# Patient Record
Sex: Female | Born: 1935
Health system: Southern US, Community
[De-identification: ages and names within clinical notes are randomized; demographics above are authoritative.]

## PROBLEM LIST (undated history)

## (undated) DIAGNOSIS — R0602 Shortness of breath: Secondary | ICD-10-CM

## (undated) DIAGNOSIS — G4733 Obstructive sleep apnea (adult) (pediatric): Secondary | ICD-10-CM

## (undated) DIAGNOSIS — Z973 Presence of spectacles and contact lenses: Secondary | ICD-10-CM

## (undated) DIAGNOSIS — M1990 Unspecified osteoarthritis, unspecified site: Secondary | ICD-10-CM

## (undated) DIAGNOSIS — E559 Vitamin D deficiency, unspecified: Secondary | ICD-10-CM

## (undated) DIAGNOSIS — D649 Anemia, unspecified: Secondary | ICD-10-CM

## (undated) DIAGNOSIS — Z803 Family history of malignant neoplasm of breast: Secondary | ICD-10-CM

## (undated) DIAGNOSIS — I6529 Occlusion and stenosis of unspecified carotid artery: Secondary | ICD-10-CM

## (undated) DIAGNOSIS — N189 Chronic kidney disease, unspecified: Secondary | ICD-10-CM

## (undated) DIAGNOSIS — I10 Essential (primary) hypertension: Secondary | ICD-10-CM

## (undated) DIAGNOSIS — E785 Hyperlipidemia, unspecified: Secondary | ICD-10-CM

## (undated) DIAGNOSIS — K219 Gastro-esophageal reflux disease without esophagitis: Secondary | ICD-10-CM

## (undated) DIAGNOSIS — H269 Unspecified cataract: Secondary | ICD-10-CM

## (undated) DIAGNOSIS — R011 Cardiac murmur, unspecified: Secondary | ICD-10-CM

## (undated) DIAGNOSIS — R350 Frequency of micturition: Secondary | ICD-10-CM

## (undated) DIAGNOSIS — I272 Pulmonary hypertension, unspecified: Secondary | ICD-10-CM

## (undated) DIAGNOSIS — R0989 Other specified symptoms and signs involving the circulatory and respiratory systems: Secondary | ICD-10-CM

## (undated) DIAGNOSIS — Z972 Presence of dental prosthetic device (complete) (partial): Secondary | ICD-10-CM

## (undated) DIAGNOSIS — Z808 Family history of malignant neoplasm of other organs or systems: Secondary | ICD-10-CM

## (undated) DIAGNOSIS — Z6841 Body Mass Index (BMI) 40.0 and over, adult: Secondary | ICD-10-CM

## (undated) HISTORY — PX: APPENDECTOMY: SHX54

## (undated) HISTORY — DX: Family history of malignant neoplasm of breast: Z80.3

## (undated) HISTORY — DX: Body Mass Index (BMI) 40.0 and over, adult: Z684

## (undated) HISTORY — DX: Morbid (severe) obesity due to excess calories: E66.01

## (undated) HISTORY — DX: Pulmonary hypertension, unspecified: I27.20

## (undated) HISTORY — DX: Essential (primary) hypertension: I10

## (undated) HISTORY — PX: BREAST LUMPECTOMY: SHX2

## (undated) HISTORY — DX: Obstructive sleep apnea (adult) (pediatric): G47.33

## (undated) HISTORY — DX: Other specified symptoms and signs involving the circulatory and respiratory systems: R09.89

## (undated) HISTORY — PX: TOTAL KNEE ARTHROPLASTY: SHX125

## (undated) HISTORY — DX: Family history of malignant neoplasm of other organs or systems: Z80.8

## (undated) HISTORY — DX: Occlusion and stenosis of unspecified carotid artery: I65.29

## (undated) HISTORY — DX: Hyperlipidemia, unspecified: E78.5

## (undated) HISTORY — DX: Vitamin D deficiency, unspecified: E55.9

## (undated) HISTORY — PX: ABDOMINAL HYSTERECTOMY: SHX81

## (undated) HISTORY — PX: US ECHOCARDIOGRAPHY: HXRAD669

## (undated) HISTORY — PX: JOINT REPLACEMENT: SHX530

## (undated) HISTORY — PX: OTHER SURGICAL HISTORY: SHX169

---

## 2000-04-09 ENCOUNTER — Encounter: Payer: Self-pay | Admitting: Psychology

## 2000-04-09 ENCOUNTER — Emergency Department (HOSPITAL_COMMUNITY): Admission: EM | Admit: 2000-04-09 | Discharge: 2000-04-09 | Payer: Self-pay | Admitting: *Deleted

## 2001-01-01 ENCOUNTER — Encounter: Admission: RE | Admit: 2001-01-01 | Discharge: 2001-01-01 | Payer: Self-pay | Admitting: Family Medicine

## 2003-04-19 ENCOUNTER — Ambulatory Visit (HOSPITAL_COMMUNITY): Admission: RE | Admit: 2003-04-19 | Discharge: 2003-04-19 | Payer: Self-pay | Admitting: *Deleted

## 2005-09-04 ENCOUNTER — Inpatient Hospital Stay (HOSPITAL_COMMUNITY): Admission: EM | Admit: 2005-09-04 | Discharge: 2005-09-09 | Payer: Self-pay | Admitting: Emergency Medicine

## 2007-04-13 ENCOUNTER — Emergency Department (HOSPITAL_COMMUNITY): Admission: EM | Admit: 2007-04-13 | Discharge: 2007-04-14 | Payer: Self-pay | Admitting: Emergency Medicine

## 2009-04-03 ENCOUNTER — Emergency Department (HOSPITAL_COMMUNITY): Admission: EM | Admit: 2009-04-03 | Discharge: 2009-04-03 | Payer: Self-pay | Admitting: Emergency Medicine

## 2009-06-04 HISTORY — PX: CARDIOVASCULAR STRESS TEST: SHX262

## 2010-05-01 ENCOUNTER — Inpatient Hospital Stay (HOSPITAL_COMMUNITY): Admission: RE | Admit: 2010-05-01 | Discharge: 2010-05-04 | Payer: Self-pay | Admitting: Orthopedic Surgery

## 2010-06-04 HISTORY — PX: OTHER SURGICAL HISTORY: SHX169

## 2010-08-15 LAB — CBC
HCT: 26.6 % — ABNORMAL LOW (ref 36.0–46.0)
HCT: 31.4 % — ABNORMAL LOW (ref 36.0–46.0)
HCT: 33.8 % — ABNORMAL LOW (ref 36.0–46.0)
Hemoglobin: 8.6 g/dL — ABNORMAL LOW (ref 12.0–15.0)
Hemoglobin: 10.3 g/dL — ABNORMAL LOW (ref 12.0–15.0)
MCH: 28.1 pg (ref 26.0–34.0)
MCH: 29.1 pg (ref 26.0–34.0)
MCHC: 32.3 g/dL (ref 30.0–36.0)
MCHC: 32.5 g/dL (ref 30.0–36.0)
MCHC: 32.8 g/dL (ref 30.0–36.0)
MCV: 86.4 fL (ref 78.0–100.0)
MCV: 86.4 fL (ref 78.0–100.0)
MCV: 87.2 fL (ref 78.0–100.0)
MCV: 88.7 fL (ref 78.0–100.0)
Platelets: 187 10*3/uL (ref 150–400)
Platelets: 232 10*3/uL (ref 150–400)
Platelets: 286 10*3/uL (ref 150–400)
RDW: 15.8 % — ABNORMAL HIGH (ref 11.5–15.5)
RDW: 15.9 % — ABNORMAL HIGH (ref 11.5–15.5)
RDW: 16.4 % — ABNORMAL HIGH (ref 11.5–15.5)
WBC: 11.4 10*3/uL — ABNORMAL HIGH (ref 4.0–10.5)
WBC: 14 10*3/uL — ABNORMAL HIGH (ref 4.0–10.5)

## 2010-08-15 LAB — COMPREHENSIVE METABOLIC PANEL
BUN: 24 mg/dL — ABNORMAL HIGH (ref 6–23)
CO2: 28 mEq/L (ref 19–32)
Calcium: 9.2 mg/dL (ref 8.4–10.5)
Chloride: 109 mEq/L (ref 96–112)
Creatinine, Ser: 1.38 mg/dL — ABNORMAL HIGH (ref 0.4–1.2)
GFR calc Af Amer: 45 mL/min — ABNORMAL LOW (ref >60)
Total Bilirubin: 0.4 mg/dL (ref 0.3–1.2)
Total Protein: 7.1 g/dL (ref 6.0–8.3)

## 2010-08-15 LAB — GLUCOSE, CAPILLARY
Glucose-Capillary: 138 mg/dL — ABNORMAL HIGH (ref 70–99)
Glucose-Capillary: 111 mg/dL — ABNORMAL HIGH (ref 70–99)
Glucose-Capillary: 114 mg/dL — ABNORMAL HIGH (ref 70–99)
Glucose-Capillary: 122 mg/dL — ABNORMAL HIGH (ref 70–99)
Glucose-Capillary: 132 mg/dL — ABNORMAL HIGH (ref 70–99)
Glucose-Capillary: 133 mg/dL — ABNORMAL HIGH (ref 70–99)
Glucose-Capillary: 135 mg/dL — ABNORMAL HIGH (ref 70–99)
Glucose-Capillary: 155 mg/dL — ABNORMAL HIGH (ref 70–99)
Glucose-Capillary: 172 mg/dL — ABNORMAL HIGH (ref 70–99)

## 2010-08-15 LAB — BASIC METABOLIC PANEL
Calcium: 8.7 mg/dL (ref 8.4–10.5)
Chloride: 108 mEq/L (ref 96–112)
GFR calc non Af Amer: 45 mL/min — ABNORMAL LOW (ref >60)
Glucose, Bld: 136 mg/dL — ABNORMAL HIGH (ref 70–99)
Potassium: 4 mEq/L (ref 3.5–5.1)
Sodium: 141 mEq/L (ref 135–145)

## 2010-08-15 LAB — BASIC METABOLIC PANEL WITH GFR
BUN: 24 mg/dL — ABNORMAL HIGH (ref 6–23)
CO2: 24 meq/L (ref 19–32)
Calcium: 8.6 mg/dL (ref 8.4–10.5)
Chloride: 108 meq/L (ref 96–112)
Creatinine, Ser: 3.15 mg/dL — ABNORMAL HIGH (ref 0.4–1.2)
GFR calc non Af Amer: 15 mL/min — ABNORMAL LOW
Glucose, Bld: 129 mg/dL — ABNORMAL HIGH (ref 70–99)
Potassium: 4.1 meq/L (ref 3.5–5.1)
Sodium: 142 meq/L (ref 135–145)

## 2010-08-15 LAB — URINALYSIS, ROUTINE W REFLEX MICROSCOPIC
Bilirubin Urine: NEGATIVE
Hgb urine dipstick: NEGATIVE
Ketones, ur: NEGATIVE mg/dL
Nitrite: NEGATIVE
Specific Gravity, Urine: 1.014 (ref 1.005–1.030)
Urobilinogen, UA: 0.2 mg/dL (ref 0.0–1.0)

## 2010-08-15 LAB — DIFFERENTIAL
Basophils Absolute: 0 10*3/uL (ref 0.0–0.1)
Basophils Relative: 0 % (ref 0–1)
Eosinophils Absolute: 0.1 10*3/uL (ref 0.0–0.7)
Lymphs Abs: 1.7 10*3/uL (ref 0.7–4.0)
Monocytes Absolute: 0.5 10*3/uL (ref 0.1–1.0)

## 2010-08-15 LAB — CROSSMATCH
Antibody Screen: NEGATIVE
Unit division: 0

## 2010-08-15 LAB — URINE CULTURE
Culture  Setup Time: 201111221720
Culture: NO GROWTH

## 2010-08-15 LAB — HEMOGLOBIN A1C
Hgb A1c MFr Bld: 6.4 % — ABNORMAL HIGH
Mean Plasma Glucose: 137 mg/dL — ABNORMAL HIGH

## 2010-08-15 LAB — PROTIME-INR
INR: 1.03 (ref 0.00–1.49)
Prothrombin Time: 13.7 seconds (ref 11.6–15.2)

## 2010-08-15 LAB — SURGICAL PCR SCREEN: Staphylococcus aureus: NEGATIVE

## 2010-09-07 LAB — URINALYSIS, ROUTINE W REFLEX MICROSCOPIC
Glucose, UA: NEGATIVE mg/dL
Nitrite: NEGATIVE
Specific Gravity, Urine: 1.011 (ref 1.005–1.030)
pH: 6.5 (ref 5.0–8.0)

## 2010-09-07 LAB — CBC
MCHC: 33.1 g/dL (ref 30.0–36.0)
MCV: 88 fL (ref 78.0–100.0)
Platelets: 237 10*3/uL (ref 150–400)
RBC: 3.61 MIL/uL — ABNORMAL LOW (ref 3.87–5.11)
RDW: 16.4 % — ABNORMAL HIGH (ref 11.5–15.5)

## 2010-09-07 LAB — COMPREHENSIVE METABOLIC PANEL
AST: 16 U/L (ref 0–37)
CO2: 23 mEq/L (ref 19–32)
Calcium: 8.4 mg/dL (ref 8.4–10.5)
Creatinine, Ser: 0.84 mg/dL (ref 0.4–1.2)
GFR calc Af Amer: >60 mL/min (ref >60)
GFR calc non Af Amer: >60 mL/min (ref >60)
Total Protein: 6.6 g/dL (ref 6.0–8.3)

## 2010-09-07 LAB — DIFFERENTIAL
Eosinophils Relative: 1 % (ref 0–5)
Lymphocytes Relative: 14 % (ref 12–46)
Lymphs Abs: 0.9 10*3/uL (ref 0.7–4.0)

## 2010-09-07 LAB — POCT CARDIAC MARKERS
Myoglobin, poc: 202 ng/mL (ref 12–200)
Troponin i, poc: <0.05 ng/mL (ref 0.00–0.09)

## 2010-09-07 LAB — URINE CULTURE: Colony Count: NO GROWTH

## 2010-10-20 NOTE — H&P (Signed)
[name], CRUSE               ACCOUNT NO.:  [id number]   MEDICAL RECORD NO.:  [id number]          PATIENT TYPE:  EMS   LOCATION:  ED                           FACILITY:  [facility]   PHYSICIAN:  Sheila Oats, M.D.DATE OF BIRTH:  12/27/1935   DATE OF ADMISSION:  09/04/2005  DATE OF DISCHARGE:                                HISTORY & PHYSICAL   CHIEF COMPLAINT:  Abdominal pain.   HISTORY OF PRESENT ILLNESS:  The patient is a 75 year old black female with  a past medical history significant for C-section and hysterectomy more than  30 years ago, hypertension, diabetes mellitus, and hyperlipidemia, who  presents with complaints of severe abdominal pain x 1 day.  The pain is  described as cramping and mid-abdominal/periumbilical in location, 10 out of  10 in intensity associated with nausea and vomiting.  She denies  hematemesis, diarrhea, fevers, hematochezia, dysuria, melena.  The patient  also denies chest pain, shortness of breath, and no PND.  The patient was  seen in the office today by Dr. Rex Kras and a plain film of her abdomen  revealed air fluid levels consistent with small bowel obstruction.  She was  sent to the ER for further evaluation and admission.   In the ER, the patient had a CT scan of her abdomen done and it showed  findings consistent with partial small bowel obstruction and a transition  zone in the mid ileum but none precisely localized.  No pelvic mass.  Urinalysis was also done and was consistent with infection and the patient's  white cell count mildly elevated at 11.4.  She is admitted to the [facility] for evaluation and management.  The patient states that  her last bowel movement was yesterday and she has had flatus today.   PAST MEDICAL HISTORY:  As stated above.   MEDICATIONS:  1.  Cartia XL 300 mg daily.  2.  Diovan HCT 160/12.5 mg daily.  3.  Actos 30 mg daily.  4.  Lipitor 20 mg daily.   ALLERGIES:  CODEINE.   SOCIAL  HISTORY:  She denies tobacco and alcohol.   FAMILY HISTORY:  Her father has diabetes.  Her mother is deceased of lung  cancer.  She also had diabetes.  She has a sister with diabetes.   REVIEW OF SYMPTOMS:  As per HPI, other comprehensive review of systems  negative.   PHYSICAL EXAMINATION:  GENERAL:  The patient is an obese elderly black female, she has an NG tube  in place, she is in no apparent distress.  VITAL SIGNS:  Temperature 97, blood pressure 154/76, pulse 76, respiratory  rate 20, O2 saturation 98%.  HEENT:  PERRL, EOMI, sclerae anicteric, slightly dry mucous membranes.  NECK:  Supple, no adenopathy, no thyromegaly.  LUNGS:  Clear to auscultation bilaterally, no crackles or wheezes.  CARDIOVASCULAR:  Normal S1 and S2, regular rate and rhythm, no S3  appreciated.  ABDOMEN:  Mildly distended, hypoactive bowel sounds, mild supraumbilical  tenderness.  No organomegaly and no masses palpable.  EXTREMITIES:  No cyanosis or edema.  NEUROLOGICAL:  Alert and oriented x 3, cranial nerves 2-12 grossly intact,  nonfocal exam.   LABORATORY DATA:  CT scan of the abdomen shows findings consistent with a  partial small bowel obstruction, right ovarian cyst is noted, 3.4 by 2.8 cm,  descending and sigmoid colon diverticulosis.  White blood cell count 11.4,  hemoglobin 12.4, hematocrit 37, platelet count 309, neutrophil count 87%.  Sodium 141, potassium 3.4, chloride 102, CO2 28, glucose 193, BUN 11,  creatinine 0.9.  Her LFTs are within normal limits.  Lipase normal at 16.  Urinalysis shows specific gravity of 1.014 with a pH 7.4.  Urine nitrite is  negative.  Leukocyte esterase is large with 11 to 20 WBCs.   ASSESSMENT/PLAN:  1.  Partial small bowel obstruction - will manage conservatively - NG tube,      IV fluids, keep n.p.o. for now and pain management with IV analgesics.      Surgery consulted per ER physician and admission to the medicine service      recommended.  2.  Probable  urinary tract infection - will obtain urine cultures, empiric      antibiotics, and follow.  3.  Hypokalemia - replete potassium.  4.  Diabetes mellitus - monitor Accu-Cheks, sliding scale coverage, hold      oral hypoglycemics while patient is n.p.o.  5.  Hypertension - IV anti-hypertensives for now and resume Cartia and      Diovan when able to tolerate p.o.  6.  Hyperlipidemia - follow and resume Lipitor when taking p.o.      Sheila Oats, M.D.  Electronically Signed     ACV/MEDQ  D:  09/04/2005  T:  09/04/2005  Job:  [id number]   cc:   Lennette Bihari L. Little, M.D.  Fax: [place]. Bubba Hales, M.D.  Fax: [phone]

## 2010-10-20 NOTE — Discharge Summary (Signed)
[name], [name]               ACCOUNT NO.:  [id number]   MEDICAL RECORD NO.:  [id number]          PATIENT TYPE:  INP   LOCATION:  1505                         FACILITY:  [facility]   PHYSICIAN:  Ashby Dawes. Polite, M.D. DATE OF BIRTH:  26-Mar-1936   DATE OF ADMISSION:  09/04/2005  DATE OF DISCHARGE:  09/09/2005                                 DISCHARGE SUMMARY   DISCHARGE DIAGNOSES:  1.  Resolved partial small bowel obstruction.  2.  Neck spasm, improved secondary to lying improperly in bed on a pillow.      Continue conservative treatment with as needed Flexeril and Tylenol.  3.  Hypertension.  4.  Diabetes.  5.  Obesity.   DISCHARGE MEDICATIONS:  The patient is to resume home meds which includes  Cardia XL 300 mg daily, Diovan/HCT 160/12.5 mg daily, Actos 30 mg daily,  Lipitor 20 mg daily.   DISPOSITION:  The patient is discharged to home in stable condition.  Asked  her to slowly resume to a normal diet and increase activity.  Follow up with  primary doctor in approximately 1 week.   STUDIES:  The patient had a CT of the abdomen consistent with partial small  bowel obstruction.  The patient had several abdominal series, the final one  on 04/07 showing normal bowel gas pattern with no obstruction.  CBC at time  of dictation:  white count 8.2, hemoglobin 10.4, platelet 274.  D-Met within  normal limits.  Urinalysis:  Nitrite negative, leukocytes large, WBC 11  to20, rare bacteria.  Urine culture - no growth.   HISTORY OF PRESENT ILLNESS:  A 75 year old female with the above medical  problems presented to the ED with complaint of abdominal pain.  In the ED,  the patient was evaluated and it was thought the patient had partial small  bowel obstruction. Admission was deemed necessary for further evaluation and  treatment.  Please see dictated history and physical for further details.   PAST MEDICAL HISTORY:  As stated above.   MEDICATIONS ON ADMISSION:  Per admission history and  physical.   ALLERGIES:  Reports an allergy to CODEINE.   SOCIAL HISTORY:  Per the admission history and physical.   FAMILY HISTORY:  Per the admission history and physical.   HOSPITAL COURSE:  The patient was admitted to a medical floor bed for  evaluation and treatment of partial small bowel obstruction.  The patient  was treated conservatively with intravenous fluids, antiemetics, and NG tube  was transiently placed for 48 hours.  Surgical consultation was requested  and recommended continued conservative treatment.  The patient's hospital  course was relatively without problems.  The patient continued to have  follow-up abdominal series until there was shown complete resolution.  In  the interim, diet was slowly advanced.  NG tube removed, and the patient was  having normal bowel movements in the hospital.  At this time, the patient is  stable for discharge.   DISCHARGE DIAGNOSES:  1.  Partial small bowel obstruction.  2.  Neck spasm/cramp.  The patient is of short stature and was lying  in the      middle of the bed with the head of the bed elevated as well as two      pillows.  I felt that this is most likely the cause of her cramps/spasms      in her neck.  The patient is going to consider conservative treatment of      Flexeril for this.  3.  The patient has several chronic medical problems which includes      diabetes, hypertension and obesity.  Will continue current outpatient      meds.  Recommend the patient follow up with primary doctor in      approximately 1 to 2 weeks.      Ashby Dawes. Polite, M.D.  Electronically Signed     RDP/MEDQ  D:  09/09/2005  T:  09/10/2005  Job:  [id number]

## 2011-01-31 ENCOUNTER — Other Ambulatory Visit: Payer: Self-pay | Admitting: Family Medicine

## 2011-01-31 DIAGNOSIS — Z1231 Encounter for screening mammogram for malignant neoplasm of breast: Secondary | ICD-10-CM

## 2011-02-06 ENCOUNTER — Ambulatory Visit
Admission: RE | Admit: 2011-02-06 | Discharge: 2011-02-06 | Disposition: A | Discharge status: Home / Self Care | Payer: 59 | Source: Ambulatory Visit | Attending: Family Medicine | Admitting: Family Medicine

## 2011-02-06 DIAGNOSIS — Z1231 Encounter for screening mammogram for malignant neoplasm of breast: Secondary | ICD-10-CM

## 2011-03-13 LAB — CBC
HCT: 30 — ABNORMAL LOW
Hemoglobin: 10.2 — ABNORMAL LOW
MCHC: 34
RDW: 14.9

## 2011-03-13 LAB — DIFFERENTIAL
Basophils Absolute: 0.1
Basophils Relative: 2 — ABNORMAL HIGH
Eosinophils Relative: 2
Monocytes Absolute: 0.6

## 2011-03-13 LAB — URINE CULTURE
Colony Count: NO GROWTH
Culture: NO GROWTH

## 2011-03-13 LAB — URINALYSIS, ROUTINE W REFLEX MICROSCOPIC
Ketones, ur: NEGATIVE
Nitrite: NEGATIVE
Protein, ur: NEGATIVE
pH: 5.5

## 2011-03-13 LAB — BASIC METABOLIC PANEL
CO2: 26
Chloride: 106
Glucose, Bld: 134 — ABNORMAL HIGH
Potassium: 3.6
Sodium: 137

## 2011-03-13 LAB — POCT CARDIAC MARKERS: Troponin i, poc: <0.05

## 2011-06-18 ENCOUNTER — Encounter (HOSPITAL_COMMUNITY): Payer: Self-pay | Admitting: Pharmacy Technician

## 2011-06-19 ENCOUNTER — Other Ambulatory Visit: Payer: Self-pay | Admitting: Orthopedic Surgery

## 2011-06-21 ENCOUNTER — Other Ambulatory Visit: Payer: Self-pay | Admitting: Orthopedic Surgery

## 2011-06-21 ENCOUNTER — Encounter (HOSPITAL_COMMUNITY): Payer: Self-pay

## 2011-06-21 ENCOUNTER — Encounter (HOSPITAL_COMMUNITY)
Admission: RE | Admit: 2011-06-21 | Discharge: 2011-06-21 | Disposition: A | Discharge status: Home / Self Care | Payer: 59 | Source: Ambulatory Visit | Attending: Orthopedic Surgery | Admitting: Orthopedic Surgery

## 2011-06-21 HISTORY — DX: Anemia, unspecified: D64.9

## 2011-06-21 HISTORY — DX: Gastro-esophageal reflux disease without esophagitis: K21.9

## 2011-06-21 HISTORY — DX: Unspecified osteoarthritis, unspecified site: M19.90

## 2011-06-21 HISTORY — DX: Cardiac murmur, unspecified: R01.1

## 2011-06-21 HISTORY — DX: Shortness of breath: R06.02

## 2011-06-21 HISTORY — DX: Presence of dental prosthetic device (complete) (partial): Z97.2

## 2011-06-21 HISTORY — DX: Frequency of micturition: R35.0

## 2011-06-21 HISTORY — DX: Presence of spectacles and contact lenses: Z97.3

## 2011-06-21 HISTORY — DX: Unspecified cataract: H26.9

## 2011-06-21 HISTORY — DX: Chronic kidney disease, unspecified: N18.9

## 2011-06-21 LAB — URINALYSIS, ROUTINE W REFLEX MICROSCOPIC
Bilirubin Urine: NEGATIVE
Hgb urine dipstick: NEGATIVE
Ketones, ur: NEGATIVE mg/dL
Nitrite: NEGATIVE
Urobilinogen, UA: 0.2 mg/dL (ref 0.0–1.0)

## 2011-06-21 LAB — COMPREHENSIVE METABOLIC PANEL
AST: 15 U/L (ref 0–37)
Alkaline Phosphatase: 86 U/L (ref 39–117)
CO2: 27 mEq/L (ref 19–32)
Chloride: 105 mEq/L (ref 96–112)
Creatinine, Ser: 1.43 mg/dL — ABNORMAL HIGH (ref 0.50–1.10)
GFR calc non Af Amer: 35 mL/min — ABNORMAL LOW (ref >90)
Potassium: 4.4 mEq/L (ref 3.5–5.1)
Total Bilirubin: 0.3 mg/dL (ref 0.3–1.2)

## 2011-06-21 LAB — URINE MICROSCOPIC-ADD ON

## 2011-06-21 LAB — URINE CULTURE

## 2011-06-21 LAB — DIFFERENTIAL
Basophils Absolute: 0 10*3/uL (ref 0.0–0.1)
Basophils Relative: 0 % (ref 0–1)
Eosinophils Absolute: 0.1 10*3/uL (ref 0.0–0.7)
Eosinophils Relative: 2 % (ref 0–5)
Monocytes Absolute: 0.4 10*3/uL (ref 0.1–1.0)
Neutro Abs: 2.8 10*3/uL (ref 1.7–7.7)

## 2011-06-21 LAB — CBC
HCT: 29.8 % — ABNORMAL LOW (ref 36.0–46.0)
MCV: 85.9 fL (ref 78.0–100.0)
Platelets: 266 10*3/uL (ref 150–400)
RBC: 3.47 MIL/uL — ABNORMAL LOW (ref 3.87–5.11)
WBC: 4.8 10*3/uL (ref 4.0–10.5)

## 2011-06-21 LAB — SURGICAL PCR SCREEN: Staphylococcus aureus: NEGATIVE

## 2011-06-21 NOTE — H&P (Signed)
  [name] MRN:  [id number] DOB/SEX:  Mar 05, 1936/female  CHIEF COMPLAINT:  Painful right Knee  HISTORY: Patient is a 76 y.o. female presented with a history of pain in the right knee. Onset of symptoms was gradual starting several years ago with gradually worsening course since that time. The patient noted no past surgery on the right knee. Prior procedures on the knee include none. Patient has been treated conservatively with over-the-counter NSAIDs and activity modification. Patient currently rates pain in the knee at 8 out of 10 with activity. There is pain at night.  PAST MEDICAL HISTORY: There are no active problems to display for this patient.  No past medical history on file. No past surgical history on file.   MEDICATIONS:   (Not in a hospital admission)  ALLERGIES:   Allergies  Allergen Reactions  . Codeine Itching  . Ibuprofen Other (See Comments)    "gave her kidney trouble"    REVIEW OF SYSTEMS:  Pertinent items are noted in HPI.   FAMILY HISTORY:  No family history on file.  SOCIAL HISTORY:   History  Substance Use Topics  . Smoking status: Not on file  . Smokeless tobacco: Not on file  . Alcohol Use: Not on file     EXAMINATION:  Vital signs in last 24 hours: @VSRANGES @  General appearance: alert, cooperative and no distress Lungs: clear to auscultation bilaterally Heart: regular rate and rhythm, S1, S2 normal, no murmur, click, rub or gallop Abdomen: soft, non-tender; bowel sounds normal; no masses,  no organomegaly Extremities: extremities normal, atraumatic, no cyanosis or edema and Homans sign is negative, no sign of DVT Pulses: 2+ and symmetric Skin: Skin color, texture, turgor normal. No rashes or lesions Neurologic: Alert and oriented X 3, normal strength and tone. Normal symmetric reflexes. Normal coordination and gait  Musculoskeletal:  ROM 0-105, Ligaments intact,  Imaging Review Plain radiographs demonstrate severe degenerative  joint disease of the right knee. The overall alignment is mild varus. The bone quality appears to be good for age and reported activity level.  Assessment/Plan: End stage arthritis, right knee   The patient history, physical examination and imaging studies are consistent with advanced degenerative joint disease of the right knee. The patient has failed conservative treatment.  The clearance notes were reviewed.  After discussion with the patient it was felt that Total Knee Replacement was indicated. The procedure,  risks, and benefits of total knee arthroplasty were presented and reviewed. The risks including but not limited to aseptic loosening, infection, blood clots, vascular injury, stiffness, patella tracking problems complications among others were discussed. The patient acknowledged the explanation, agreed to proceed with the plan.  [name] 06/21/2011, 11:01 AM

## 2011-06-21 NOTE — Progress Notes (Signed)
Requested CXR, EKG, OV notes from Dr. Irish Lack at [facility].  Requested sleep study from [facility] [place]. Chart left for review by anesthesia re: lab abnormalities.

## 2011-06-21 NOTE — Pre-Procedure Instructions (Signed)
[place]  06/21/2011   Your procedure is scheduled on:  [facility] January 28  Report to Section at 6:30 AM.  Call this number if you have problems the morning of surgery: [phone]   Remember:   Do not eat food:After Midnight.  May have clear liquids: up to 4 Hours before arrival.  Clear liquids include soda, tea, black coffee, apple or grape juice, broth.  Take these medicines the morning of surgery with A SIP OF WATER: Diltiazem   Do not wear jewelry, make-up or nail polish.  Do not wear lotions, powders, or perfumes. You may wear deodorant.  Do not shave 48 hours prior to surgery.  Do not bring valuables to the hospital.  Contacts, dentures or bridgework may not be worn into surgery.  Leave suitcase in the car. After surgery it may be brought to your room.  For patients admitted to the hospital, checkout time is 11:00 AM the day of discharge.   Patients discharged the day of surgery will not be allowed to drive home.  Name and phone number of your driver: NA  Special Instructions: Incentive Spirometry - Practice and bring it with you on the day of surgery. and CHG Shower Use Special Wash: 1/2 bottle night before surgery and 1/2 bottle morning of surgery.   Please read over the following fact sheets that you were given: Pain Booklet, Coughing and Deep Breathing, Blood Transfusion Information and Surgical Site Infection Prevention

## 2011-06-22 NOTE — Consult Note (Signed)
Anesthesia:  Patient is a 76 year old female scheduled for right TKA on 07/02/11.  Her history includes murmur, OSA, SOB, anemia, CKD Stage III, GERD, cataracts, DM2, s/p left TKA 04/2010.  Her Cardiologist is Dr. Irish Lack who saw her for clearance on 05/04/11.  No further cardiac testing was recommended at that time.  EKG then showed SR.  She had a low risk stress test in April of 2011. EF was 73%.  Echo from 06/23/08 showed normal LV size and function, EF 60-65%, normal RV size and function, trace MR, mild-mod TR, no AS or AR.  Labs noted.  BUN/Cr 30/1.43. Glucose was 92.  H/H 9.8/29.8. (Based on previous labs, her baseline Hgb seems to be around 10).  A T & S is already done.  PTT is mildly elevated at 43, but PT/INR are WNL.  Of note, her Cr peaked at 3.6 following her left TKA in 2011.  Urine culture showed E. Coli (20,000).  I did call her urine and H/H results to Dr. Ruel Favors office.  CXR in Epic is > 1 year ago, so she is suppose to have a CXR done on arrival unless we receive a more recent CXR from [place] (Dr. Leighton Ruff).   Plan to proceed.

## 2011-06-25 ENCOUNTER — Encounter (HOSPITAL_COMMUNITY): Payer: Self-pay

## 2011-06-25 NOTE — Progress Notes (Signed)
[facility]   REQUESTING SLEEP STUDY ... LEFT CALL BACK NUMBER .

## 2011-06-26 ENCOUNTER — Encounter: Payer: Self-pay | Admitting: Cardiology

## 2011-06-28 NOTE — Progress Notes (Signed)
[facility] called for sleep study,they will not release any records without a release form.

## 2011-07-01 MED ORDER — CEFAZOLIN SODIUM-DEXTROSE 2-3 GM-% IV SOLR
2.0000 g | INTRAVENOUS | Status: AC
  Administered 2011-07-02: 2 g via INTRAVENOUS
  Filled 2011-07-01: qty 50

## 2011-07-02 ENCOUNTER — Encounter (HOSPITAL_COMMUNITY)
Admission: RE | Disposition: A | Discharge status: Skilled Nursing Facility | Payer: Self-pay | Source: Ambulatory Visit | Attending: Orthopedic Surgery

## 2011-07-02 ENCOUNTER — Encounter (HOSPITAL_COMMUNITY): Payer: Self-pay | Admitting: Vascular Surgery

## 2011-07-02 ENCOUNTER — Inpatient Hospital Stay (HOSPITAL_COMMUNITY)
Admission: RE | Admit: 2011-07-02 | Discharge: 2011-07-05 | DRG: 470 | Disposition: A | Discharge status: Skilled Nursing Facility | Payer: 59 | Source: Ambulatory Visit | Attending: Orthopedic Surgery | Admitting: Orthopedic Surgery

## 2011-07-02 ENCOUNTER — Ambulatory Visit (HOSPITAL_COMMUNITY): Payer: 59 | Admitting: Vascular Surgery

## 2011-07-02 ENCOUNTER — Ambulatory Visit (HOSPITAL_COMMUNITY): Payer: 59

## 2011-07-02 ENCOUNTER — Encounter (HOSPITAL_COMMUNITY): Payer: Self-pay | Admitting: *Deleted

## 2011-07-02 DIAGNOSIS — M1710 Unilateral primary osteoarthritis, unspecified knee: Principal | ICD-10-CM | POA: Diagnosis present

## 2011-07-02 DIAGNOSIS — E119 Type 2 diabetes mellitus without complications: Secondary | ICD-10-CM | POA: Diagnosis present

## 2011-07-02 DIAGNOSIS — N184 Chronic kidney disease, stage 4 (severe): Secondary | ICD-10-CM | POA: Diagnosis not present

## 2011-07-02 DIAGNOSIS — Z96659 Presence of unspecified artificial knee joint: Secondary | ICD-10-CM | POA: Diagnosis not present

## 2011-07-02 DIAGNOSIS — G4730 Sleep apnea, unspecified: Secondary | ICD-10-CM | POA: Diagnosis not present

## 2011-07-02 DIAGNOSIS — M1711 Unilateral primary osteoarthritis, right knee: Secondary | ICD-10-CM

## 2011-07-02 DIAGNOSIS — N183 Chronic kidney disease, stage 3 unspecified: Secondary | ICD-10-CM | POA: Diagnosis present

## 2011-07-02 DIAGNOSIS — D62 Acute posthemorrhagic anemia: Secondary | ICD-10-CM | POA: Diagnosis not present

## 2011-07-02 DIAGNOSIS — IMO0002 Reserved for concepts with insufficient information to code with codable children: Principal | ICD-10-CM | POA: Diagnosis present

## 2011-07-02 DIAGNOSIS — D509 Iron deficiency anemia, unspecified: Secondary | ICD-10-CM | POA: Diagnosis not present

## 2011-07-02 DIAGNOSIS — Z01811 Encounter for preprocedural respiratory examination: Secondary | ICD-10-CM | POA: Diagnosis not present

## 2011-07-02 DIAGNOSIS — R0602 Shortness of breath: Secondary | ICD-10-CM | POA: Diagnosis not present

## 2011-07-02 DIAGNOSIS — K219 Gastro-esophageal reflux disease without esophagitis: Secondary | ICD-10-CM | POA: Diagnosis not present

## 2011-07-02 DIAGNOSIS — I10 Essential (primary) hypertension: Secondary | ICD-10-CM | POA: Diagnosis not present

## 2011-07-02 DIAGNOSIS — G8918 Other acute postprocedural pain: Secondary | ICD-10-CM | POA: Diagnosis not present

## 2011-07-02 HISTORY — PX: TOTAL KNEE ARTHROPLASTY: SHX125

## 2011-07-02 LAB — GLUCOSE, CAPILLARY: Glucose-Capillary: 108 mg/dL — ABNORMAL HIGH (ref 70–99)

## 2011-07-02 SURGERY — ARTHROPLASTY, KNEE, TOTAL
Anesthesia: Regional | Site: Knee | Laterality: Right | Wound class: Clean

## 2011-07-02 MED ORDER — EZETIMIBE 10 MG PO TABS
10.0000 mg | ORAL_TABLET | Freq: Every day | ORAL | Status: DC
  Administered 2011-07-02 – 2011-07-04 (×3): 10 mg via ORAL
  Filled 2011-07-02 (×4): qty 1

## 2011-07-02 MED ORDER — NEOSTIGMINE METHYLSULFATE 1 MG/ML IJ SOLN
INTRAMUSCULAR | Status: DC | PRN
  Administered 2011-07-02: 5 mg via INTRAVENOUS

## 2011-07-02 MED ORDER — SENNOSIDES-DOCUSATE SODIUM 8.6-50 MG PO TABS: 1.0000 | ORAL_TABLET | Freq: Every evening | ORAL | Status: DC | PRN

## 2011-07-02 MED ORDER — ACETAMINOPHEN 10 MG/ML IV SOLN: 1000.0000 mg | Freq: Four times a day (QID) | INTRAVENOUS | Status: DC

## 2011-07-02 MED ORDER — ACETAMINOPHEN 10 MG/ML IV SOLN
1000.0000 mg | Freq: Four times a day (QID) | INTRAVENOUS | Status: DC
  Administered 2011-07-02: 1000 mg via INTRAVENOUS

## 2011-07-02 MED ORDER — ZOLPIDEM TARTRATE 5 MG PO TABS: 5.0000 mg | ORAL_TABLET | Freq: Every evening | ORAL | Status: DC | PRN

## 2011-07-02 MED ORDER — HYDROMORPHONE HCL PF 1 MG/ML IJ SOLN
0.2500 mg | INTRAMUSCULAR | Status: DC | PRN
  Administered 2011-07-02 (×2): 0.5 mg via INTRAVENOUS

## 2011-07-02 MED ORDER — FERROUS SULFATE 325 (65 FE) MG PO TABS
325.0000 mg | ORAL_TABLET | Freq: Every day | ORAL | Status: DC
  Administered 2011-07-03 – 2011-07-05 (×3): 325 mg via ORAL
  Filled 2011-07-02 (×4): qty 1

## 2011-07-02 MED ORDER — ONDANSETRON HCL 4 MG PO TABS: 4.0000 mg | ORAL_TABLET | Freq: Four times a day (QID) | ORAL | Status: DC | PRN

## 2011-07-02 MED ORDER — SODIUM CHLORIDE 0.9 % IR SOLN
Status: DC | PRN
  Administered 2011-07-02: 3000 mL
  Administered 2011-07-02: 1

## 2011-07-02 MED ORDER — OLMESARTAN MEDOXOMIL 40 MG PO TABS
40.0000 mg | ORAL_TABLET | Freq: Every day | ORAL | Status: DC
  Administered 2011-07-04 – 2011-07-05 (×2): 40 mg via ORAL
  Filled 2011-07-02 (×4): qty 1

## 2011-07-02 MED ORDER — CEFAZOLIN SODIUM-DEXTROSE 2-3 GM-% IV SOLR: 2.0000 g | INTRAVENOUS | Status: DC

## 2011-07-02 MED ORDER — PIOGLITAZONE HCL 30 MG PO TABS
30.0000 mg | ORAL_TABLET | Freq: Every day | ORAL | Status: DC
  Administered 2011-07-03 – 2011-07-05 (×3): 30 mg via ORAL
  Filled 2011-07-02 (×4): qty 1

## 2011-07-02 MED ORDER — ALUM & MAG HYDROXIDE-SIMETH 200-200-20 MG/5ML PO SUSP: 30.0000 mL | ORAL | Status: DC | PRN

## 2011-07-02 MED ORDER — METHOCARBAMOL 500 MG PO TABS
500.0000 mg | ORAL_TABLET | Freq: Four times a day (QID) | ORAL | Status: DC | PRN
  Administered 2011-07-03 – 2011-07-05 (×5): 500 mg via ORAL
  Filled 2011-07-02 (×6): qty 1

## 2011-07-02 MED ORDER — OXYCODONE HCL 10 MG PO TB12
10.0000 mg | ORAL_TABLET | Freq: Two times a day (BID) | ORAL | Status: DC
  Administered 2011-07-02 – 2011-07-03 (×2): 10 mg via ORAL
  Filled 2011-07-02 (×2): qty 1

## 2011-07-02 MED ORDER — ONDANSETRON HCL 4 MG/2ML IJ SOLN: 4.0000 mg | Freq: Four times a day (QID) | INTRAMUSCULAR | Status: DC | PRN

## 2011-07-02 MED ORDER — ROSUVASTATIN CALCIUM 20 MG PO TABS
20.0000 mg | ORAL_TABLET | Freq: Every day | ORAL | Status: DC
  Administered 2011-07-03 – 2011-07-05 (×3): 20 mg via ORAL
  Filled 2011-07-02 (×4): qty 1

## 2011-07-02 MED ORDER — FENTANYL CITRATE 0.05 MG/ML IJ SOLN
INTRAMUSCULAR | Status: DC | PRN
  Administered 2011-07-02: 100 ug via INTRAVENOUS
  Administered 2011-07-02 (×2): 50 ug via INTRAVENOUS

## 2011-07-02 MED ORDER — ONDANSETRON HCL 4 MG/2ML IJ SOLN
4.0000 mg | Freq: Four times a day (QID) | INTRAMUSCULAR | Status: DC | PRN
  Administered 2011-07-02 – 2011-07-03 (×2): 4 mg via INTRAVENOUS
  Filled 2011-07-02 (×2): qty 2

## 2011-07-02 MED ORDER — HYDROCHLOROTHIAZIDE 25 MG PO TABS
25.0000 mg | ORAL_TABLET | Freq: Every day | ORAL | Status: DC
  Administered 2011-07-04 – 2011-07-05 (×2): 25 mg via ORAL
  Filled 2011-07-02 (×4): qty 1

## 2011-07-02 MED ORDER — BUPIVACAINE ON-Q PAIN PUMP (FOR ORDER SET NO CHG)
INJECTION | Status: DC
  Filled 2011-07-02: qty 1

## 2011-07-02 MED ORDER — CHLORHEXIDINE GLUCONATE 4 % EX LIQD: 60.0000 mL | Freq: Once | CUTANEOUS | Status: DC

## 2011-07-02 MED ORDER — GLYCOPYRROLATE 0.2 MG/ML IJ SOLN
INTRAMUSCULAR | Status: DC | PRN
  Administered 2011-07-02: .6 mg via INTRAVENOUS

## 2011-07-02 MED ORDER — METOCLOPRAMIDE HCL 10 MG PO TABS: 5.0000 mg | ORAL_TABLET | Freq: Three times a day (TID) | ORAL | Status: DC | PRN

## 2011-07-02 MED ORDER — DIPHENHYDRAMINE HCL 12.5 MG/5ML PO ELIX
12.5000 mg | ORAL_SOLUTION | ORAL | Status: DC | PRN
  Filled 2011-07-02: qty 10

## 2011-07-02 MED ORDER — PROPOFOL 10 MG/ML IV EMUL
INTRAVENOUS | Status: DC | PRN
  Administered 2011-07-02: 50 mg via INTRAVENOUS
  Administered 2011-07-02: 180 mg via INTRAVENOUS
  Administered 2011-07-02: 50 mg via INTRAVENOUS

## 2011-07-02 MED ORDER — BUPIVACAINE-EPINEPHRINE PF 0.5-1:200000 % IJ SOLN
INTRAMUSCULAR | Status: DC | PRN
  Administered 2011-07-02: 30 mL

## 2011-07-02 MED ORDER — OXYCODONE HCL 5 MG PO TABS
5.0000 mg | ORAL_TABLET | ORAL | Status: DC | PRN
  Administered 2011-07-03: 5 mg via ORAL
  Filled 2011-07-02: qty 1

## 2011-07-02 MED ORDER — DILTIAZEM HCL ER BEADS 300 MG PO CP24
300.0000 mg | ORAL_CAPSULE | Freq: Every day | ORAL | Status: DC
  Administered 2011-07-03 – 2011-07-05 (×3): 300 mg via ORAL
  Filled 2011-07-02 (×3): qty 1

## 2011-07-02 MED ORDER — PHENOL 1.4 % MT LIQD
1.0000 | OROMUCOSAL | Status: DC | PRN
  Filled 2011-07-02: qty 177

## 2011-07-02 MED ORDER — METHOCARBAMOL 100 MG/ML IJ SOLN
500.0000 mg | Freq: Four times a day (QID) | INTRAVENOUS | Status: DC | PRN
  Administered 2011-07-02: 500 mg via INTRAVENOUS
  Filled 2011-07-02 (×2): qty 5

## 2011-07-02 MED ORDER — FAMOTIDINE 40 MG PO TABS
40.0000 mg | ORAL_TABLET | Freq: Every day | ORAL | Status: DC
  Administered 2011-07-03 – 2011-07-05 (×3): 40 mg via ORAL
  Filled 2011-07-02 (×4): qty 1

## 2011-07-02 MED ORDER — FLEET ENEMA 7-19 GM/118ML RE ENEM: 1.0000 | ENEMA | Freq: Once | RECTAL | Status: AC | PRN

## 2011-07-02 MED ORDER — SODIUM CHLORIDE 0.9 % IV SOLN: INTRAVENOUS | Status: DC

## 2011-07-02 MED ORDER — BISACODYL 5 MG PO TBEC
5.0000 mg | DELAYED_RELEASE_TABLET | Freq: Every day | ORAL | Status: DC | PRN
  Administered 2011-07-03: 5 mg via ORAL
  Filled 2011-07-02: qty 1

## 2011-07-02 MED ORDER — MENTHOL 3 MG MT LOZG: 1.0000 | LOZENGE | OROMUCOSAL | Status: DC | PRN

## 2011-07-02 MED ORDER — EPHEDRINE SULFATE 50 MG/ML IJ SOLN
INTRAMUSCULAR | Status: DC | PRN
  Administered 2011-07-02: 10 mg via INTRAVENOUS

## 2011-07-02 MED ORDER — CEFAZOLIN SODIUM-DEXTROSE 2-3 GM-% IV SOLR
2.0000 g | Freq: Four times a day (QID) | INTRAVENOUS | Status: AC
  Administered 2011-07-02 – 2011-07-03 (×3): 2 g via INTRAVENOUS
  Filled 2011-07-02 (×3): qty 50

## 2011-07-02 MED ORDER — ONDANSETRON HCL 4 MG/2ML IJ SOLN
INTRAMUSCULAR | Status: DC | PRN
  Administered 2011-07-02: 4 mg via INTRAVENOUS

## 2011-07-02 MED ORDER — BUPIVACAINE-EPINEPHRINE 0.25% -1:200000 IJ SOLN
INTRAMUSCULAR | Status: DC | PRN
  Administered 2011-07-02: 20 mL

## 2011-07-02 MED ORDER — HYDROMORPHONE HCL PF 1 MG/ML IJ SOLN
0.5000 mg | INTRAMUSCULAR | Status: DC | PRN
  Administered 2011-07-02: 1 mg via INTRAVENOUS
  Administered 2011-07-02: 0.5 mg via INTRAVENOUS
  Administered 2011-07-03: 1 mg via INTRAVENOUS
  Filled 2011-07-02 (×3): qty 1

## 2011-07-02 MED ORDER — ROCURONIUM BROMIDE 100 MG/10ML IV SOLN
INTRAVENOUS | Status: DC | PRN
  Administered 2011-07-02: 50 mg via INTRAVENOUS

## 2011-07-02 MED ORDER — ACETAMINOPHEN 10 MG/ML IV SOLN
INTRAVENOUS | Status: AC
  Filled 2011-07-02: qty 100

## 2011-07-02 MED ORDER — SODIUM CHLORIDE 0.9 % IV SOLN
INTRAVENOUS | Status: DC
  Administered 2011-07-02: 08:00:00 via INTRAVENOUS

## 2011-07-02 MED ORDER — ACETAMINOPHEN 325 MG PO TABS
650.0000 mg | ORAL_TABLET | Freq: Four times a day (QID) | ORAL | Status: DC | PRN
  Administered 2011-07-04: 650 mg via ORAL
  Filled 2011-07-02: qty 2

## 2011-07-02 MED ORDER — BUPIVACAINE 0.25 % ON-Q PUMP SINGLE CATH 300ML
300.0000 mL | INJECTION | Status: AC
  Administered 2011-07-02: 300 mL
  Filled 2011-07-02: qty 300

## 2011-07-02 MED ORDER — ENOXAPARIN SODIUM 40 MG/0.4ML ~~LOC~~ SOLN
40.0000 mg | SUBCUTANEOUS | Status: DC
  Administered 2011-07-03 – 2011-07-05 (×3): 40 mg via SUBCUTANEOUS
  Filled 2011-07-02 (×4): qty 0.4

## 2011-07-02 MED ORDER — VALSARTAN-HYDROCHLOROTHIAZIDE 320-25 MG PO TABS: 1.0000 | ORAL_TABLET | ORAL | Status: DC

## 2011-07-02 MED ORDER — MIDAZOLAM HCL 5 MG/5ML IJ SOLN
INTRAMUSCULAR | Status: DC | PRN
  Administered 2011-07-02: 2 mg via INTRAVENOUS

## 2011-07-02 MED ORDER — DOCUSATE SODIUM 100 MG PO CAPS
100.0000 mg | ORAL_CAPSULE | Freq: Two times a day (BID) | ORAL | Status: DC
  Administered 2011-07-02 – 2011-07-05 (×6): 100 mg via ORAL
  Filled 2011-07-02 (×7): qty 1

## 2011-07-02 MED ORDER — ACETAMINOPHEN 650 MG RE SUPP: 650.0000 mg | Freq: Four times a day (QID) | RECTAL | Status: DC | PRN

## 2011-07-02 MED ORDER — METOCLOPRAMIDE HCL 5 MG/ML IJ SOLN
5.0000 mg | Freq: Three times a day (TID) | INTRAMUSCULAR | Status: DC | PRN
  Administered 2011-07-02: 10 mg via INTRAVENOUS
  Filled 2011-07-02: qty 2

## 2011-07-02 MED ORDER — SODIUM CHLORIDE 0.9 % IV SOLN
INTRAVENOUS | Status: DC
  Administered 2011-07-02: 23:00:00 via INTRAVENOUS

## 2011-07-02 SURGICAL SUPPLY — 68 items
BANDAGE ELASTIC 4 VELCRO ST LF (GAUZE/BANDAGES/DRESSINGS) ×1 IMPLANT
BANDAGE ESMARK 6X9 LF (GAUZE/BANDAGES/DRESSINGS) ×1 IMPLANT
BLADE SAGITTAL 13X1.27X60 (BLADE) ×2 IMPLANT
BLADE SAW SGTL 83.5X18.5 (BLADE) ×2 IMPLANT
BLADE SURG 10 STRL SS (BLADE) ×1 IMPLANT
BNDG CMPR 9X6 STRL LF SNTH (GAUZE/BANDAGES/DRESSINGS) ×1
BNDG CMPR MED 10X6 ELC LF (GAUZE/BANDAGES/DRESSINGS) ×1
BNDG ELASTIC 6X10 VLCR STRL LF (GAUZE/BANDAGES/DRESSINGS) ×1 IMPLANT
BNDG ESMARK 6X9 LF (GAUZE/BANDAGES/DRESSINGS) ×2
BOWL SMART MIX CTS (DISPOSABLE) ×2 IMPLANT
CATH KIT ON Q 10IN SLV (PAIN MANAGEMENT) ×2 IMPLANT
CEMENT BONE SIMPLEX SPEEDSET (Cement) ×4 IMPLANT
CLOTH BEACON ORANGE TIMEOUT ST (SAFETY) ×2 IMPLANT
COVER BACK TABLE 24X17X13 BIG (DRAPES) ×1 IMPLANT
COVER SURGICAL LIGHT HANDLE (MISCELLANEOUS) ×2 IMPLANT
CUFF TOURNIQUET SINGLE 34IN LL (TOURNIQUET CUFF) ×1 IMPLANT
CUFF TOURNIQUET SINGLE 44IN (TOURNIQUET CUFF) ×1 IMPLANT
DRAPE EXTREMITY T 121X128X90 (DRAPE) ×2 IMPLANT
DRAPE INCISE IOBAN 66X45 STRL (DRAPES) ×4 IMPLANT
DRAPE PROXIMA HALF (DRAPES) ×2 IMPLANT
DRAPE U-SHAPE 47X51 STRL (DRAPES) ×2 IMPLANT
DRSG ADAPTIC 3X8 NADH LF (GAUZE/BANDAGES/DRESSINGS) ×2 IMPLANT
DRSG PAD ABDOMINAL 8X10 ST (GAUZE/BANDAGES/DRESSINGS) ×3 IMPLANT
DURAPREP 26ML APPLICATOR (WOUND CARE) ×4 IMPLANT
ELECT REM PT RETURN 9FT ADLT (ELECTROSURGICAL) ×2
ELECTRODE REM PT RTRN 9FT ADLT (ELECTROSURGICAL) ×1 IMPLANT
EVACUATOR 1/8 PVC DRAIN (DRAIN) ×2 IMPLANT
GLOVE BIOGEL M 7.0 STRL (GLOVE) ×1 IMPLANT
GLOVE BIOGEL PI IND STRL 7.0 (GLOVE) IMPLANT
GLOVE BIOGEL PI IND STRL 7.5 (GLOVE) IMPLANT
GLOVE BIOGEL PI IND STRL 8.5 (GLOVE) ×2 IMPLANT
GLOVE BIOGEL PI INDICATOR 7.0 (GLOVE) ×1
GLOVE BIOGEL PI INDICATOR 7.5 (GLOVE) ×1
GLOVE BIOGEL PI INDICATOR 8.5 (GLOVE) ×3
GLOVE EXAM NITRILE LRG STRL (GLOVE) ×1 IMPLANT
GLOVE SURG ORTHO 8.0 STRL STRW (GLOVE) ×4 IMPLANT
GLOVE SURG SS PI 6.5 STRL IVOR (GLOVE) ×1 IMPLANT
GOWN PREVENTION PLUS XLARGE (GOWN DISPOSABLE) ×5 IMPLANT
GOWN STRL NON-REIN LRG LVL3 (GOWN DISPOSABLE) ×3 IMPLANT
HANDPIECE INTERPULSE COAX TIP (DISPOSABLE) ×2
HOOD PEEL AWAY FACE SHEILD DIS (HOOD) ×8 IMPLANT
KIT BASIN OR (CUSTOM PROCEDURE TRAY) ×2 IMPLANT
KIT ROOM TURNOVER OR (KITS) ×2 IMPLANT
MANIFOLD NEPTUNE II (INSTRUMENTS) ×2 IMPLANT
NEEDLE 22X1 1/2 (OR ONLY) (NEEDLE) ×1 IMPLANT
NS IRRIG 1000ML POUR BTL (IV SOLUTION) ×2 IMPLANT
PACK TOTAL JOINT (CUSTOM PROCEDURE TRAY) ×2 IMPLANT
PAD ARMBOARD 7.5X6 YLW CONV (MISCELLANEOUS) ×3 IMPLANT
PADDING CAST COTTON 6X4 STRL (CAST SUPPLIES) ×2 IMPLANT
PADDING WEBRIL 6 STERILE (GAUZE/BANDAGES/DRESSINGS) ×1 IMPLANT
POSITIONER HEAD PRONE TRACH (MISCELLANEOUS) ×2 IMPLANT
SET HNDPC FAN SPRY TIP SCT (DISPOSABLE) ×1 IMPLANT
SPONGE GAUZE 4X4 12PLY (GAUZE/BANDAGES/DRESSINGS) ×2 IMPLANT
STAPLER VISISTAT 35W (STAPLE) ×2 IMPLANT
SUCTION FRAZIER TIP 10 FR DISP (SUCTIONS) ×2 IMPLANT
SUT BONE WAX W31G (SUTURE) ×2 IMPLANT
SUT VIC AB 0 CT1 27 (SUTURE) ×2
SUT VIC AB 0 CT1 27XBRD ANBCTR (SUTURE) IMPLANT
SUT VIC AB 0 CTB1 27 (SUTURE) ×4 IMPLANT
SUT VIC AB 1 CT1 27 (SUTURE) ×4
SUT VIC AB 1 CT1 27XBRD ANBCTR (SUTURE) ×4 IMPLANT
SUT VIC AB 2-0 CT1 27 (SUTURE) ×8
SUT VIC AB 2-0 CT1 TAPERPNT 27 (SUTURE) ×2 IMPLANT
SYR CONTROL 10ML LL (SYRINGE) ×1 IMPLANT
TOWEL OR 17X24 6PK STRL BLUE (TOWEL DISPOSABLE) ×2 IMPLANT
TOWEL OR 17X26 10 PK STRL BLUE (TOWEL DISPOSABLE) ×2 IMPLANT
TRAY FOLEY CATH 14FR (SET/KITS/TRAYS/PACK) ×2 IMPLANT
WATER STERILE IRR 1000ML POUR (IV SOLUTION) ×5 IMPLANT

## 2011-07-02 NOTE — Transfer of Care (Signed)
Immediate Anesthesia Transfer of Care Note  Patient: [name]  Procedure(s) Performed:  TOTAL KNEE ARTHROPLASTY  Patient Location: PACU  Anesthesia Type: General and Regional  Level of Consciousness: sedated and patient cooperative  Airway & Oxygen Therapy: Patient Spontanous Breathing and Patient connected to nasal cannula oxygen  Post-op Assessment: Report given to PACU RN, Post -op Vital signs reviewed and stable and Patient moving all extremities  Post vital signs: Reviewed and stable  Complications: No apparent anesthesia complications

## 2011-07-02 NOTE — Progress Notes (Signed)
Release of information form sent to [facility] at [facility] for sleep study.  Office not open yet.  Pt brought in her c-pap settings and place them on the chart.

## 2011-07-02 NOTE — Op Note (Signed)
TOTAL KNEE REPLACEMENT OPERATIVE NOTE:  07/02/2011  6:05 PM  PATIENT:  [name]  76 y.o. female  PRE-OPERATIVE DIAGNOSIS:  osteoarthritis right knee  POST-OPERATIVE DIAGNOSIS:  osteoarthritis right knee  PROCEDURE:  Procedure(s): TOTAL KNEE ARTHROPLASTY  SURGEON:  Surgeon(s): Rudean Haskell, MD  PHYSICIAN ASSISTANT: Carlynn Spry, [facility]  ANESTHESIA:   general  DRAINS: Hemovac and On-Q Marcaine Pain Pump  SPECIMEN: None  COUNTS:  Correct  TOURNIQUET:   Total Tourniquet Time Documented: Thigh (Right) - 60 minutes  DICTATION:  Indication for procedure:    The patient is a 76 y.o. female who has failed conservative treatment for osteoarthritis right knee.  Informed consent was obtained prior to anesthesia. The risks versus benefits of the operation were explain and in a way the patient can, and did, understand.   Description of procedure:     The patient was taken to the operating room and placed under anesthesia.  The patient was positioned in the usual fashion taking care that all body parts were adequately padded and/or protected.  I foley catheter was placed.  A tourniquet was applied and the leg prepped and draped in the usual sterile fashion.  The extremity was exsanguinated with the esmarch and tourniquet inflated to 350 mmHg.  Pre-operative range of motion was normal.  The knee was in 3 degree of mild varus.  A midline incision approximately 6-7 inches long was made with a #10 blade.  A new blade was used to make a parapatellar arthrotomy going 2-3 cm into the quadriceps tendon, over the patella, and alongside the medial aspect of the patellar tendon.  A synovectomy was then performed with the #10 blade and forceps. I then elevated the deep MCL off the medial tibial metaphysis subperiosteally around to the semimembranosus attachment.    I everted the patella and used calipers to measure patellar thickness, which was ###.  I used the reamer to ream down to  appropriate thickness to recreated the native thickness.  I then removed excess bone with the rongeur and sagittal saw.  I used the ## mm template and drilled the three lug holes.  I then put the trial in place and measured the thickness with the calipers to ensure recreation of the native thickness.  The trial was then removed and the patella subluxed and the knee brought into flexion.  A homan retractor was place to retract and protect the patella and lateral structures.  A Z-retractor was place medially to protect the medial structures.  The extra-medullary alignment system was used to make cut the tibial articular surface perpendicular to the anamotic axis of the tibia and in 3 degrees of posterior slope.  The cut surface and alignment jig was removed.  I then used the intramedullary alignment guide to make a 6 valgus cut on the distal femur.  I then marked out the epicondylar axis on the distal femur.  The posterior condylar axis measured 5 degrees.  I then used the anterior referencing sizer and measured the femur to be a size C.  The 4-In-1 cutting block was screwed into place in external rotation matching the posterior condylar angle, making our cuts perpendicular to the epicondylar axis.  Anterior, posterior and chamfer cuts were made with the sagittal saw.  The cutting block and cut pieces were removed.  A lamina spreader was placed in 90 degrees of flexion.  The ACL, PCL, menisci, and posterior condylar osteophytes were removed.  A 10 mm spacer blocked was found to  offer good flexion and extension gap balance after minimal in degree releasing.   The scoop retractor was then placed and the femoral finishing block was pinned in place.  The small sagittal saw was used as well as the lug drill to finish the femur.  The block and cut surfaces were removed and the medullary canal hole filled with autograft bone from the cut pieces.  The tibia was delivered forward in deep flexion and external rotation.   A size 3 tray was selected and pinned into place centered on the medial 1/3 of the tibial tubercle.  The reamer and keel was used to prepare the tibia through the tray.    I then trialed with the size C femur, size 3 tibia, a 10 mm insert and the 32 patella.  I had excellent flexion/extension gap balance, excellent patella tracking.  Flexion was full and beyond 120 degrees; extension was zero.  These components were chosen and the staff opened them to me on the back table while the knee was lavaged copiously and the cement mixed.  I cemented in the components and removed all excess cement.  The polyethylene tibial component was snapped into place and the knee placed in extension while cement was hardening.  The capsule was infilltrated with 20cc of .25% Marcaine with epinephrine.  A hemovac was place in the joint exiting superolaterally.  A pain pump was place superomedially superficial to the arthrotomy.  Once the cement was hard, the tourniquet was let down.  Hemostasis was obtained.  The arthrotomy was closed with figure-8 #1 vicryl sutures.  The deep soft tissues were closed with #0 vicryls and the subcuticular layer closed with a running #2-0 vicryl.  The skin was reapproximated and closed with skin staples.  The wound was dressed with xeroform, 4 x4's, 2 ABD sponges, a single layer of webril and a TED stocking.   The patient was then awakened, extubated, and taken to the recovery room in stable condition.  BLOOD LOSS:  300cc DRAINS: 1 hemovac, 1 pain catheter COMPLICATIONS:  None.  PLAN OF CARE: Admit to inpatient   PATIENT DISPOSITION:  PACU - hemodynamically stable.   Delay start of Pharmacological VTE agent (>24hrs) due to surgical blood loss or risk of bleeding:  not applicable  Please fax a copy of this to my office:  [phone].

## 2011-07-02 NOTE — H&P (View-Only) (Signed)
  [name] MRN:  [id number] DOB/SEX:  02-09-1936/female  CHIEF COMPLAINT:  Painful right Knee  HISTORY: Patient is a 76 y.o. female presented with a history of pain in the right knee. Onset of symptoms was gradual starting several years ago with gradually worsening course since that time. The patient noted no past surgery on the right knee. Prior procedures on the knee include none. Patient has been treated conservatively with over-the-counter NSAIDs and activity modification. Patient currently rates pain in the knee at 8 out of 10 with activity. There is pain at night.  PAST MEDICAL HISTORY: There are no active problems to display for this patient.  No past medical history on file. No past surgical history on file.   MEDICATIONS:   (Not in a hospital admission)  ALLERGIES:   Allergies  Allergen Reactions  . Codeine Itching  . Ibuprofen Other (See Comments)    "gave her kidney trouble"    REVIEW OF SYSTEMS:  Pertinent items are noted in HPI.   FAMILY HISTORY:  No family history on file.  SOCIAL HISTORY:   History  Substance Use Topics  . Smoking status: Not on file  . Smokeless tobacco: Not on file  . Alcohol Use: Not on file     EXAMINATION:  Vital signs in last 24 hours: @VSRANGES @  General appearance: alert, cooperative and no distress Lungs: clear to auscultation bilaterally Heart: regular rate and rhythm, S1, S2 normal, no murmur, click, rub or gallop Abdomen: soft, non-tender; bowel sounds normal; no masses,  no organomegaly Extremities: extremities normal, atraumatic, no cyanosis or edema and Homans sign is negative, no sign of DVT Pulses: 2+ and symmetric Skin: Skin color, texture, turgor normal. No rashes or lesions Neurologic: Alert and oriented X 3, normal strength and tone. Normal symmetric reflexes. Normal coordination and gait  Musculoskeletal:  ROM 0-105, Ligaments intact,  Imaging Review Plain radiographs demonstrate severe degenerative  joint disease of the right knee. The overall alignment is mild varus. The bone quality appears to be good for age and reported activity level.  Assessment/Plan: End stage arthritis, right knee   The patient history, physical examination and imaging studies are consistent with advanced degenerative joint disease of the right knee. The patient has failed conservative treatment.  The clearance notes were reviewed.  After discussion with the patient it was felt that Total Knee Replacement was indicated. The procedure,  risks, and benefits of total knee arthroplasty were presented and reviewed. The risks including but not limited to aseptic loosening, infection, blood clots, vascular injury, stiffness, patella tracking problems complications among others were discussed. The patient acknowledged the explanation, agreed to proceed with the plan.  [name] 06/21/2011, 11:01 AM

## 2011-07-02 NOTE — Anesthesia Procedure Notes (Signed)
Anesthesia Regional Block:  Femoral nerve block  Pre-Anesthetic Checklist: ,, timeout performed, Correct Patient, Correct Site, Correct Laterality, Correct Procedure,, site marked, risks and benefits discussed, Surgical consent,  Pre-op evaluation,  At surgeon's request and post-op pain management  Laterality: Right  Prep: chloraprep       Needles:  Injection technique: Single-shot  Needle Type: Echogenic Stimulator Needle     Needle Length: 9cm  Needle Gauge: 21    Additional Needles:  Procedures: nerve stimulator Femoral nerve block  Nerve Stimulator or Paresthesia:  Response: Quadriceps muscle contraction, 0.45 mA,   Additional Responses:   Narrative:  Start time: 07/02/2011 8:35 AM End time: 07/02/2011 8:45 AM Injection made incrementally with aspirations every 5 mL.  Performed by: Personally  Anesthesiologist: Dr Marcie Bal  Additional Notes: Functioning IV was confirmed and monitors were applied.  A 40mm 21ga Arrow echogenic stimulator needle was used. Sterile prep and drape,hand hygiene and sterile gloves were used.  Negative aspiration and negative test dose prior to incremental administration of local anesthetic. The patient tolerated the procedure well.    Femoral nerve block

## 2011-07-02 NOTE — Interval H&P Note (Signed)
History and Physical Interval Note:  07/02/2011 7:43 AM  [name]  has presented today for surgery, with the diagnosis of osteoarthritis right knee  The various methods of treatment have been discussed with the patient and family. After consideration of risks, benefits and other options for treatment, the patient has consented to  Procedure(s): TOTAL KNEE ARTHROPLASTY as a surgical intervention .  The patients' history has been reviewed, patient examined, no change in status, stable for surgery.  I have reviewed the patients' chart and labs.  Questions were answered to the patient's satisfaction.     Vitoria Conyer,STEPHEN D

## 2011-07-02 NOTE — Anesthesia Preprocedure Evaluation (Addendum)
Anesthesia Evaluation  Patient identified by MRN, date of birth, ID band Patient awake    Reviewed: Allergy & Precautions, H&P , NPO status , Patient's Chart, lab work & pertinent test results  Airway Mallampati: I  Neck ROM: full  Mouth opening: Limited Mouth Opening  Dental  (+) Edentulous Upper   Pulmonary shortness of breath, sleep apnea and Continuous Positive Airway Pressure Ventilation ,          Cardiovascular hypertension, Pt. on medications + Valvular Problems/Murmurs     Neuro/Psych    GI/Hepatic GERD-  Medicated and Controlled,  Endo/Other  Diabetes mellitus-, Well Controlled, Type 2, Oral Hypoglycemic AgentsMorbid obesity  Renal/GU Renal InsufficiencyRenal disease     Musculoskeletal   Abdominal   Peds  Hematology   Anesthesia Other Findings   Reproductive/Obstetrics                          Anesthesia Physical Anesthesia Plan  ASA: III  Anesthesia Plan: General and Regional   Post-op Pain Management: MAC Combined w/ Regional for Post-op pain   Induction: Intravenous  Airway Management Planned: Oral ETT  Additional Equipment:   Intra-op Plan:   Post-operative Plan: Extubation in OR  Informed Consent: I have reviewed the patients History and Physical, chart, labs and discussed the procedure including the risks, benefits and alternatives for the proposed anesthesia with the patient or authorized representative who has indicated his/her understanding and acceptance.     Plan Discussed with: CRNA and Surgeon  Anesthesia Plan Comments:         Anesthesia Quick Evaluation

## 2011-07-02 NOTE — Anesthesia Postprocedure Evaluation (Signed)
  Anesthesia Post-op Note  Patient: [name]  Procedure(s) Performed:  TOTAL KNEE ARTHROPLASTY  Patient Location: PACU  Anesthesia Type: General and GA combined with regional for post-op pain  Level of Consciousness: awake, alert  and oriented  Airway and Oxygen Therapy: Patient Spontanous Breathing and Patient connected to nasal cannula oxygen  Post-op Pain: mild  Post-op Assessment: Post-op Vital signs reviewed and Patient's Cardiovascular Status Stable  Post-op Vital Signs: stable  Complications: No apparent anesthesia complications

## 2011-07-03 ENCOUNTER — Encounter (HOSPITAL_COMMUNITY): Payer: Self-pay | Admitting: Orthopedic Surgery

## 2011-07-03 LAB — CBC
HCT: 25.1 % — ABNORMAL LOW (ref 36.0–46.0)
Hemoglobin: 8.3 g/dL — ABNORMAL LOW (ref 12.0–15.0)
MCH: 28.1 pg (ref 26.0–34.0)
MCHC: 33.1 g/dL (ref 30.0–36.0)
MCV: 85.1 fL (ref 78.0–100.0)
RDW: 16.3 % — ABNORMAL HIGH (ref 11.5–15.5)

## 2011-07-03 LAB — GLUCOSE, CAPILLARY
Glucose-Capillary: 136 mg/dL — ABNORMAL HIGH (ref 70–99)
Glucose-Capillary: 165 mg/dL — ABNORMAL HIGH (ref 70–99)

## 2011-07-03 LAB — BASIC METABOLIC PANEL
BUN: 27 mg/dL — ABNORMAL HIGH (ref 6–23)
Creatinine, Ser: 1.66 mg/dL — ABNORMAL HIGH (ref 0.50–1.10)
GFR calc Af Amer: 34 mL/min — ABNORMAL LOW (ref >90)
GFR calc non Af Amer: 30 mL/min — ABNORMAL LOW (ref >90)
Glucose, Bld: 142 mg/dL — ABNORMAL HIGH (ref 70–99)

## 2011-07-03 MED ORDER — HYDROCODONE-ACETAMINOPHEN 5-325 MG PO TABS
1.0000 | ORAL_TABLET | ORAL | Status: DC | PRN
  Administered 2011-07-03: 2 via ORAL
  Administered 2011-07-04 – 2011-07-05 (×6): 1 via ORAL
  Filled 2011-07-03 (×2): qty 1
  Filled 2011-07-03: qty 2
  Filled 2011-07-03 (×5): qty 1

## 2011-07-03 MED FILL — Hydromorphone HCl Inj 1 MG/ML: INTRAMUSCULAR | Qty: 1 | Status: AC

## 2011-07-03 NOTE — Progress Notes (Signed)
PATIENT ID:      NYASIA MAGUIRE  MRN:     [id number] DOB/AGE:    Jun 26, 1935 / 76 y.o.    PROGRESS NOTE Subjective:  negative for Chest Pain  negative for Shortness of Breath  negative for Nausea/Vomiting   negative for Calf Pain  negative for Bowel Movement   Tolerating Diet: yes         Patient reports pain as 6 on 0-10 scale.    Objective: Vital signs in last 24 hours:  Patient Vitals for the past 24 hrs:  BP Temp Temp src Pulse Resp SpO2  07/03/11 0628 118/65 mmHg 98.2 F (36.8 C) - 63  20  99 %  07/03/11 0431 129/48 mmHg 98.3 F (36.8 C) - 66  20  100 %  07/02/11 2202 115/56 mmHg 97.5 F (36.4 C) - 59  19  99 %  07/02/11 1330 129/62 mmHg 97.6 F (36.4 C) Axillary 69  18  97 %  07/02/11 1300 - 97.6 F (36.4 C) - - - 93 %  07/02/11 1130 136/64 mmHg 97.6 F (36.4 C) - 76  24  -      Intake/Output from previous day:   01/28 0701 - 01/29 0700 In: 4015 [P.O.:240; I.V.:3550] Out: 720 [Urine:545; Drains:175]   Intake/Output this shift:       Intake/Output      01/28 0701 - 01/29 0700 01/29 0701 - 01/30 0700   P.O. 240    I.V. 3550    Other 225    Total Intake 4015    Urine 545    Drains 175    Total Output 720    Net +3295            LABORATORY DATA:  Basename 07/03/11 0615  WBC 9.1  HGB 8.3*  HCT 25.1*  PLT 205    Basename 07/03/11 0615  NA 142  K 4.0  CL 109  CO2 20  BUN 27*  CREATININE 1.66*  GLUCOSE 142*  CALCIUM 8.7   Lab Results  Component Value Date   INR 1.11 06/21/2011   INR 1.03 04/25/2010    Examination:  General appearance: alert, cooperative and no distress Extremities: Homans sign is negative, no sign of DVT  Wound Exam: clean, dry, intact   Drainage:  Scant/small amount Serosanguinous exudate  Motor Exam EHL and FHL Intact  Sensory Exam Deep Peroneal normal  Assessment:    1 Day Post-Op  Procedure(s) (LRB): TOTAL KNEE ARTHROPLASTY (Right)  ADDITIONAL DIAGNOSIS:  Active Problems:  * No active hospital problems.  *   Acute Blood Loss Anemia   Plan: Physical Therapy as ordered Weight Bearing as Tolerated (WBAT)  DVT Prophylaxis:  Lovenox  DISCHARGE PLAN: Skilled Nursing Facility/Rehab  DISCHARGE NEEDS: HHPT, CPM, Walker and 3-in-1 comode seat         Derionna Salvador 07/03/2011, 8:31 AM

## 2011-07-03 NOTE — Progress Notes (Signed)
CARE MANAGEMENT NOTE 07/03/2011  Discharge Planning. Patient is for shortterm rehab at [facility]. Social worker aware.

## 2011-07-03 NOTE — Progress Notes (Signed)
Physical Therapy Treatment Patient Details Name: [name] MRN: [id number] DOB: Feb 24, 1936 Today's Date: 07/03/2011  PT Assessment/Plan  PT - Assessment/Plan Comments on Treatment Session: Patient with c/o of nausea and increased R knee pain limiting ambulation tolerance at this time. Patient however assisted with bathing her top half of her body. Patient required maxA for lower body washing, peri-hygiene s/p urination and washing of backside. PT Plan: Discharge plan remains appropriate PT Frequency: 7X/week Follow Up Recommendations: Skilled nursing facility Equipment Recommended: Defer to next venue PT Goals  Acute Rehab PT Goals PT Goal Formulation: With patient Time For Goal Achievement: 7 days Pt will go Supine/Side to Sit: with supervision;with HOB 0 degrees PT Goal: Supine/Side to Sit - Progress: Progressing toward goal Pt will go Sit to Stand: with supervision PT Goal: Sit to Stand - Progress: Progressing toward goal Pt will Ambulate: 51 - 150 feet;with supervision;with rolling walker PT Goal: Ambulate - Progress: Progressing toward goal Pt will Perform Home Exercise Program: Independently PT Goal: Perform Home Exercise Program - Progress: Met  PT Treatment Precautions/Restrictions  Precautions Precautions: Knee Required Braces or Orthoses: No Restrictions Weight Bearing Restrictions: Yes RLE Weight Bearing: Weight bearing as tolerated Mobility (including Balance) Bed Mobility Bed Mobility:  (pt received sitting up in chair with request to use BSC) Supine to Sit: 3: Mod assist;With rails;HOB flat (20 degrees HOB elevation) Transfers Transfers: Yes Sit to Stand: 3: Mod assist;With upper extremity assist;From bed Sit to Stand Details (indicate cue type and reason): verbal cues for R LE managment and hand placement Stand Pivot Transfers: 4: Min assist Stand Pivot Transfer Details (indicate cue type and reason): verbal cues for walker  managment Ambulation/Gait Ambulation/Gait: Yes Ambulation/Gait Assistance: 4: Min assist Ambulation Distance (Feet): 3 Feet Assistive device: Rolling walker Gait Pattern: Step-to pattern;Decreased step length - right;Decreased stance time - right;Antalgic Gait velocity: decreased cadence Stairs: No    Pain: 8/10 End of Session PT - End of Session Equipment Utilized During Treatment: Gait belt Activity Tolerance: Patient tolerated treatment well Patient left:in bed in CPM set at 0-90 degrees, patient and daughter instructed on how to stop machine if increased pain occured Nurse Communication: Mobility status for transfers;Mobility status for ambulation General Behavior During Session: [facility] for tasks performed Cognition: [facility] for tasks performed  Kingsley Callander 07/03/2011, 1:29 PM  Kittie Plater, PT, DPT Pager #: [phone] Office #: [phone]

## 2011-07-03 NOTE — Progress Notes (Signed)
UR COMPLETED  

## 2011-07-03 NOTE — Progress Notes (Signed)
Physical therapy Evaluation  Past Medical History  Diagnosis Date  . Heart murmur   . Shortness of breath   . Anemia     takes iron  . Chronic kidney disease     stage III  . GERD (gastroesophageal reflux disease)   . Arthritis   . Cataracts, bilateral   . Diabetes mellitus   . Wears dentures     upper  . Wears glasses   . Urination frequency   . Sleep apnea     CPAP machine   Past Surgical History  Procedure Date  . Total knee arthroplasty     Left  . Abdominal hysterectomy     partial  . Appendectomy   . US echocardiography     2012  . Sleep study 2012  . Cardiovascular stress test 2011  . Total knee arthroplasty 07/02/2011    Procedure: TOTAL KNEE ARTHROPLASTY;  Surgeon: Rudean Haskell, MD;  Location: [place];  Service: Orthopedics;  Laterality: Right;     07/03/11 0847  PT Visit Information  Last PT Received On 07/03/11  Precautions  Precautions Knee  Required Braces or Orthoses No  Restrictions  RLE Weight Bearing WBAT  Home Living  Lives With Daughter (daughter goes to school and works, pt alone most of day)  Type of Home House (patient going to SNF upon d/c)  Prior Function  Level of Independence Independent with basic ADLs;Requires assistive device for independence;Independent with transfers;Independent with gait (cane for ambulation)  Able to Take Stairs? Yes  Vocation Retired  Production manager  Overall Cognitive Status Appears within functional limits for tasks assessed  Orientation Level Oriented X4  Sensation  Light Touch Appears Intact  Bed Mobility  Supine to Sit 3: Mod assist;With rails;HOB flat (20 degrees HOB elevation)  Supine to Sit Details (indicate cue type and reason) assist at trunk and R LE  Transfers  Transfers Yes  Sit to Stand 3: Mod assist;With upper extremity assist;From bed  Sit to Stand Details (indicate cue type and reason) verbal cues for hand placement  Ambulation/Gait  Ambulation/Gait Yes    Ambulation/Gait Assistance 4: Min assist  Ambulation/Gait Assistance Details (indicate cue type and reason) verbal cues to complete R quad set during stance phase  Ambulation Distance (Feet) 15 Feet  Assistive device Rolling walker  Gait Pattern Step-to pattern;Decreased step length - right;Decreased stance time - right;Antalgic  Gait velocity decreased cadence  Stairs No  RUE Assessment  RUE Assessment WFL  LUE Assessment  LUE Assessment WFL  RLE Assessment  RLE Assessment Not tested (secondary to patient s/p R TKR)  LLE Assessment  LLE Assessment WFL  Exercises  Exercises Total Joint- given exercise hand out  Total Joint Exercises  Ankle Circles/Pumps AROM;Both;10 reps;Supine  Quad Sets AROM;Right;10 reps;Supine  Heel Slides AAROM;Right;10 reps;Supine  PT - End of Session  Equipment Utilized During Treatment Gait belt  Activity Tolerance Patient tolerated treatment well  Patient left in chair;with call bell in reach;with family/visitor present  Nurse Communication Mobility status for transfers;Mobility status for ambulation  General  Behavior During Session [facility] for tasks performed  Cognition [facility] for tasks performed  PT Assessment  Clinical Impression Statement Patient s/p R TKA presenting with increased R LE pain and decreased R LE strength and knee AROM. Patient tolerated session well however would benefit from skilled PT upon d/c due to patient to be home alone during the day.  PT Recommendation/Assessment Patient will need skilled PT in the acute care  venue  PT Problem List Decreased strength;Decreased range of motion;Decreased activity tolerance;Decreased balance;Decreased mobility  Barriers to Discharge Decreased caregiver support  PT Therapy Diagnosis  Difficulty walking;Abnormality of gait;Generalized weakness;Acute pain  PT Plan  PT Frequency 7X/week  PT Treatment/Interventions Gait training;Stair training;Functional mobility training;Therapeutic  activities;Therapeutic exercise  PT Recommendation  Follow Up Recommendations Skilled nursing facility  Equipment Recommended Defer to next venue  Individuals Consulted  Consulted and Agree with Results and Recommendations Patient  Acute Rehab PT Goals  PT Goal Formulation With patient  Time For Goal Achievement 7 days  Pt will go Supine/Side to Sit with supervision;with HOB 0 degrees  PT Goal: Supine/Side to Sit - Progress Goal set today  Pt will go Sit to Stand with supervision  PT Goal: Sit to Stand - Progress Goal set today  Pt will Ambulate 51 - 150 feet;with supervision;with rolling walker  PT Goal: Ambulate - Progress Goal set today  Pt will Perform Home Exercise Program Independently  PT Goal: Perform Home Exercise Program - Progress Goal set today    Kittie Plater, PT, DPT Pager #: [phone] Office #: [phone]

## 2011-07-03 NOTE — Progress Notes (Signed)
OT Cancellation Note  Treatment cancelled today due to medical issues with patient which prohibited therapy.PT nauseated form pain meds.  [place], OTR/L  [id number] 07/03/2011 07/03/2011, 4:07 PM

## 2011-07-04 LAB — CBC
Hemoglobin: 8.5 g/dL — ABNORMAL LOW (ref 12.0–15.0)
MCH: 28.4 pg (ref 26.0–34.0)
MCHC: 33.1 g/dL (ref 30.0–36.0)
Platelets: 209 10*3/uL (ref 150–400)
RBC: 2.99 MIL/uL — ABNORMAL LOW (ref 3.87–5.11)

## 2011-07-04 LAB — GLUCOSE, CAPILLARY
Glucose-Capillary: 122 mg/dL — ABNORMAL HIGH (ref 70–99)
Glucose-Capillary: 131 mg/dL — ABNORMAL HIGH (ref 70–99)
Glucose-Capillary: 147 mg/dL — ABNORMAL HIGH (ref 70–99)

## 2011-07-04 LAB — BASIC METABOLIC PANEL
CO2: 22 mEq/L (ref 19–32)
Calcium: 9.4 mg/dL (ref 8.4–10.5)
GFR calc non Af Amer: 18 mL/min — ABNORMAL LOW (ref >90)
Glucose, Bld: 131 mg/dL — ABNORMAL HIGH (ref 70–99)
Potassium: 4.7 mEq/L (ref 3.5–5.1)
Sodium: 141 mEq/L (ref 135–145)

## 2011-07-04 MED ORDER — SORBITOL 70 % SOLN
30.0000 mL | Freq: Two times a day (BID) | Status: DC | PRN
  Administered 2011-07-04 – 2011-07-05 (×2): 30 mL via ORAL
  Filled 2011-07-04 (×2): qty 30

## 2011-07-04 NOTE — Progress Notes (Addendum)
Pt is s/p R TKR. Pt is alert and oriented x 3. Pt is to be WBAT with RW. Knee precautions. Pt has 1+ non pitting edema RLE. CPM per order. Ace dressing to RLE that is dry and intact. Ice prn to RLE. Pt lungs CTA but noted to be diminished in the bases. No s/sx resp or cardiac distress and no c/o such. Pt vital signs stable. Pt sats 93% RA. Pt repts prod cough with small amount of thin yellow phlegm. Pt performs IS per order. Heart rate regular rate and rhythm. Abdomen soft flat nontender and nondistended. BS hypoactive x 4. Pt denies passing gas and pt denies nausea or vomiting today. Pt tolerates diet fair to good. Pt repts LBM 1/27. Pt repts feeling a "little constipated". Will request an order for a laxative and administer per order. Pt repts that she had "quite a bit of nausea and vomiting yesterday" that is "gone today". Pt has a history of "dribbles" urinary. Pt denies a change from her home baseline. No pressure skin issues noted of heels or sacral area. Pt turns self and floats heels. No rashes noted under breasts or folds. Clean and moist for incontinent episodes. Pt pain being controlled with PO pain meds. Plan is to d/c to SNF tomorrow for further PT/OT.

## 2011-07-04 NOTE — Progress Notes (Signed)
Occupational Therapy Evaluation Patient Details Name: [name] MRN: [id number] DOB: July 18, 1935 Today's Date: 07/04/2011  Problem List: There is no problem list on file for this patient.   Past Medical History:  Past Medical History  Diagnosis Date  . Heart murmur   . Shortness of breath   . Anemia     takes iron  . Chronic kidney disease     stage III  . GERD (gastroesophageal reflux disease)   . Arthritis   . Cataracts, bilateral   . Diabetes mellitus   . Wears dentures     upper  . Wears glasses   . Urination frequency   . Sleep apnea     CPAP machine   Past Surgical History:  Past Surgical History  Procedure Date  . Total knee arthroplasty     Left  . Abdominal hysterectomy     partial  . Appendectomy   . US echocardiography     2012  . Sleep study 2012  . Cardiovascular stress test 2011  . Total knee arthroplasty 07/02/2011    Procedure: TOTAL KNEE ARTHROPLASTY;  Surgeon: Rudean Haskell, MD;  Location: [place];  Service: Orthopedics;  Laterality: Right;    OT Assessment/Plan/Recommendation OT Assessment Clinical Impression Statement: 76 y o s/p R TKA. Pt plans to go SNF for rehab. Pt will benefit from skilled OT acute services to max indep with ADL and functional mobility for ADL and to reach below established goals. Pt has all nec AE and DME. OT Recommendation/Assessment: Patient will need skilled OT in the acute care venue OT Problem List: Decreased strength;Decreased range of motion;Decreased activity tolerance;Decreased knowledge of use of DME or AE;Pain Barriers to Discharge: Decreased caregiver support OT Therapy Diagnosis : Generalized weakness OT Plan OT Frequency: Min 1X/week OT Treatment/Interventions: Self-care/ADL training;Therapeutic exercise;DME and/or AE instruction;Therapeutic activities;Patient/family education OT Recommendation Follow Up Recommendations: Skilled nursing facility Equipment Recommended: Defer to next venue Individuals  Consulted Consulted and Agree with Results and Recommendations: Patient OT Goals Acute Rehab OT Goals OT Goal Formulation: With patient Time For Goal Achievement: 7 days ADL Goals Pt Will Perform Lower Body Bathing: with modified independence;Unsupported;Sit to stand from chair;with adaptive equipment ADL Goal: Lower Body Bathing - Progress: Goal set today Pt Will Perform Lower Body Dressing: with modified independence;Sit to stand from chair;with adaptive equipment;Unsupported ADL Goal: Lower Body Dressing - Progress: Goal set today Pt Will Transfer to Toilet: with modified independence;with DME;3-in-1 ADL Goal: Toilet Transfer - Progress: Goal set today Pt Will Perform Tub/Shower Transfer: Shower transfer;with modified independence;with DME;Ambulation ADL Goal: Tub/Shower Transfer - Progress: Goal set today Additional ADL Goal #1: Pt will comlete simple home mgnt task with supervision @ RW level. ADL Goal: Additional Goal #1 - Progress: Goal set today  OT Evaluation Precautions/Restrictions  Precautions Precautions: Knee Precaution Booklet Issued: No Required Braces or Orthoses: No Restrictions Weight Bearing Restrictions: Yes RLE Weight Bearing: Weight bearing as tolerated Prior Functioning Home Living Lives With: Daughter Receives Help From: Family Type of Home: House Home Layout: One level Home Access: Stairs to enter [employer] Shower/Tub: Chiropodist: Handicapped height Bathroom Accessibility: Yes How Accessible: Accessible via walker Home Adaptive Equipment: Shower chair with back;Reacher;Long-handled sponge;Sock aid;Walker - rolling Prior Function Level of Independence: Independent with basic ADLs;Requires assistive device for independence;Independent with transfers;Independent with gait Able to Take Stairs?: Yes Driving: No Vocation: Retired ADL ADL Eating/Feeding: Performed;Independent Where Assessed - Eating/Feeding: Chair Grooming:  Performed;Supervision/safety Where Assessed - Grooming: Sitting, chair Upper Body  Bathing: Simulated;Set up Lower Body Bathing: Simulated;Moderate assistance Where Assessed - Lower Body Bathing: Sit to stand from chair Upper Body Dressing: Simulated;Set up Where Assessed - Upper Body Dressing: Sitting, chair Lower Body Dressing: Simulated;Moderate assistance Where Assessed - Lower Body Dressing: Sit to stand from chair Toilet Transfer: Performed;Minimal assistance Toilet Transfer Method: Ambulating Toilet Transfer Equipment: Bedside commode Toileting - Clothing Manipulation: Performed;Independent Where Assessed - Toileting Clothing Manipulation: Standing;Sit to stand from 3-in-1 or toilet Toileting - Hygiene: Independent Where Bear Dance: Standing Tub/Shower Transfer: Not assessed Equipment Used: Rolling walker;Reacher;Sock aid;Long-handled shoe horn;Long-handled sponge (leg lifter) Ambulation Related to ADLs: scc[facility] Vision/Perception    Cognition Cognition Arousal/Alertness: Awake/alert Overall Cognitive Status: Appears within functional limits for tasks assessed Orientation Level: Oriented X4 Sensation/Coordination Sensation Light Touch: Appears Intact Proprioception: Appears Intact Coordination Gross Motor Movements are Fluid and Coordinated: Yes Fine Motor Movements are Fluid and Coordinated: Yes Extremity Assessment RUE Assessment RUE Assessment: Within Functional Limits LUE Assessment LUE Assessment: Within Functional Limits Mobility  Transfers Transfers: Yes Sit to Stand: 4: Min assist Sit to Stand Details (indicate cue type and reason):  (steady A) Exercises   End of Session OT - End of Session Equipment Utilized During Treatment: Gait belt Activity Tolerance: Patient tolerated treatment well Patient left: in chair;with family/visitor present General Behavior During Session: [facility] for tasks performed Cognition: [facility] for tasks performed    [facility] 07/04/2011, 3:34 PM  [facility], OTR/L  [phone] 07/04/2011

## 2011-07-04 NOTE — Progress Notes (Signed)
Physical Therapy Treatment Patient Details Name: [name] MRN: [id number] DOB: 09-Dec-1935 Today's Date: 07/04/2011  PT Assessment/Plan  PT - Assessment/Plan Comments on Treatment Session: Patient with decreased ambulation endurance due to increased R knee pain. PT Plan: Discharge plan remains appropriate PT Frequency: 7X/week Follow Up Recommendations: Skilled nursing facility Equipment Recommended: Defer to next venue PT Goals  Acute Rehab PT Goals PT Goal: Supine/Side to Sit - Progress: Progressing toward goal PT Goal: Sit to Stand - Progress: Progressing toward goal PT Goal: Ambulate - Progress: Progressing toward goal PT Goal: Perform Home Exercise Program - Progress: Met  PT Treatment Precautions/Restrictions  Precautions Precautions: Knee Precaution Booklet Issued: No Required Braces or Orthoses: No Restrictions Weight Bearing Restrictions: Yes RLE Weight Bearing: Weight bearing as tolerated Mobility (including Balance) Bed Mobility Bed Mobility: No Supine to Sit:  (educated pt on use of leg lifter) Sit to Supine: 1: +1 Total assist;Patient percentage (comment) (pt= 40%, pt with difficulty with LE managment) Sit to Supine - Details (indicate cue type and reason): assist at trunk and LE management Transfers Sit to Stand: 3: Mod assist Sit to Stand Details (indicate cue type and reason): increased assist due to increased R LE pain, Ambulation/Gait Ambulation/Gait Assistance: 4: Min assist Ambulation/Gait Assistance Details (indicate cue type and reason): limited by increaed R LE pain Ambulation Distance (Feet): 10 Feet Assistive device: Rolling walker Gait Pattern: Step-to pattern;Decreased step length - right;Decreased stance time - right Gait velocity: decreased caence    Exercise  Total Joint Exercises Heel Slides: AAROM;Right;20 reps;Seated End of Session PT - End of Session Equipment Utilized During Treatment: Gait belt Activity Tolerance: Patient  limited by pain Patient left: in CPM (RN notified, CPM set to 0-80 degrees) General Behavior During Session: [facility] for tasks performed Cognition: [facility] for tasks performed  Kingsley Callander 07/04/2011, 3:34 PM  Kittie Plater, PT, DPT Pager #: [phone] Office #: [phone]

## 2011-07-04 NOTE — Progress Notes (Signed)
PATIENT ID:      [name]  MRN:     [id number] DOB/AGE:    [birth year] / 76 y.o.    PROGRESS NOTE Subjective:  negative for Chest Pain  negative for Shortness of Breath  negative for Nausea/Vomiting   negative for Calf Pain  negative for Bowel Movement   Tolerating Diet: yes         Patient reports pain as 5 on 0-10 scale.    Objective: Vital signs in last 24 hours:  Patient Vitals for the past 24 hrs:  BP Temp Pulse Resp SpO2  07/04/11 0610 143/73 mmHg 98.5 F (36.9 C) 74  20  93 %  07/03/11 2157 119/57 mmHg 100.4 F (38 C) 70  20  90 %  07/03/11 1417 142/75 mmHg 98.4 F (36.9 C) 75  18  93 %      Intake/Output from previous day:   01/29 0701 - 01/30 0700 In: 240 [P.O.:240] Out: 400 [Urine:200; Drains:200]   Intake/Output this shift:       Intake/Output      01/29 0701 - 01/30 0700 01/30 0701 - 01/31 0700   P.O. 240    I.V.     Other     Total Intake 240    Urine 200    Drains 200    Total Output 400    Net -160         Urine Occurrence 2 x       LABORATORY DATA:  Basename 07/04/11 0535 07/03/11 0615  WBC 14.1* 9.1  HGB 8.5* 8.3*  HCT 25.7* 25.1*  PLT 209 205    Basename 07/04/11 0535 07/03/11 0615  NA 141 142  K 4.7 4.0  CL 109 109  CO2 22 20  BUN 31* 27*  CREATININE 2.48* 1.66*  GLUCOSE 131* 142*  CALCIUM 9.4 8.7   Lab Results  Component Value Date   INR 1.11 06/21/2011   INR 1.03 04/25/2010    Examination:  General appearance: alert, cooperative and no distress Extremities: Homans sign is negative, no sign of DVT  Wound Exam: clean, dry, intact   Drainage:  None: wound tissue dry  Motor Exam EHL and FHL Intact  Sensory Exam Deep Peroneal normal  Assessment:    2 Days Post-Op  Procedure(s) (LRB): TOTAL KNEE ARTHROPLASTY (Right)  ADDITIONAL DIAGNOSIS:  Active Problems:  * No active hospital problems. *   Acute Blood Loss Anemia   Plan: Physical Therapy as ordered Weight Bearing as Tolerated (WBAT)  DVT  Prophylaxis:  Lovenox  DISCHARGE PLAN: Skilled Nursing Facility/Rehab  DISCHARGE NEEDS: HHPT, CPM, Walker and 3-in-1 comode seat         Bain Whichard 07/04/2011, 11:15 AM

## 2011-07-04 NOTE — Progress Notes (Signed)
Physical Therapy Treatment Patient Details Name: [name] MRN: [id number] DOB: [birth year] Today's Date: 07/04/2011  PT Assessment/Plan  PT - Assessment/Plan Comments on Treatment Session: Patient with increased R knee pain/soreness limiting patients ambulation tolerance and R knee flexion both active and AAROM. PT Plan: Discharge plan remains appropriate PT Frequency: 7X/week Follow Up Recommendations: Skilled nursing facility Equipment Recommended: Defer to next venue PT Goals  Acute Rehab PT Goals PT Goal: Supine/Side to Sit - Progress: Progressing toward goal PT Goal: Sit to Stand - Progress: Progressing toward goal PT Goal: Ambulate - Progress: Progressing toward goal PT Goal: Perform Home Exercise Program - Progress: Met  PT Treatment Precautions/Restrictions  Precautions Precautions: Knee Required Braces or Orthoses: No Restrictions Weight Bearing Restrictions: Yes RLE Weight Bearing: Weight bearing as tolerated Mobility (including Balance) Bed Mobility Supine to Sit: 3: Mod assist Supine to Sit Details (indicate cue type and reason): assist at trunk and R LE Transfers Sit to Stand: 2: Max assist Sit to Stand Details (indicate cue type and reason): increased assist required this date due to increased R knee pain/soreness Ambulation/Gait Ambulation/Gait Assistance: 4: Min assist Ambulation/Gait Assistance Details (indicate cue type and reason): 2 standing rest breaks due to increased R knee pain Ambulation Distance (Feet): 25 Feet Assistive device: Rolling walker Gait Pattern: Step-to pattern;Decreased step length - right;Decreased stance time - right;Antalgic Gait velocity: decreased cadence Stairs: No    Exercise  Total Joint Exercises Ankle Circles/Pumps: AROM;Both;20 reps;Supine Quad Sets: AROM;Right;20 reps;Supine Heel Slides: AAROM;Right;10 reps;Supine  Pain: 9/10 - RN provided pain medication  End of Session General Behavior During Session: [facility]  for tasks performed Cognition: [facility] for tasks performed  Kingsley Callander 07/04/2011, 12:30 PM  Kittie Plater, PT, DPT Pager #: [phone] Office #: [phone]

## 2011-07-04 NOTE — Progress Notes (Signed)
SNF bed confirmed for patient at [facility] where she has been in past. Family pleased with this and I will plan to f/u to finalize d/c plans tomorrow a.m. Eduard Clos, MSW, [place]

## 2011-07-05 DIAGNOSIS — N184 Chronic kidney disease, stage 4 (severe): Secondary | ICD-10-CM | POA: Diagnosis not present

## 2011-07-05 DIAGNOSIS — D509 Iron deficiency anemia, unspecified: Secondary | ICD-10-CM | POA: Diagnosis not present

## 2011-07-05 DIAGNOSIS — Z96659 Presence of unspecified artificial knee joint: Secondary | ICD-10-CM | POA: Diagnosis not present

## 2011-07-05 DIAGNOSIS — K219 Gastro-esophageal reflux disease without esophagitis: Secondary | ICD-10-CM | POA: Diagnosis not present

## 2011-07-05 DIAGNOSIS — G4730 Sleep apnea, unspecified: Secondary | ICD-10-CM | POA: Diagnosis not present

## 2011-07-05 DIAGNOSIS — I10 Essential (primary) hypertension: Secondary | ICD-10-CM | POA: Diagnosis not present

## 2011-07-05 LAB — BASIC METABOLIC PANEL
BUN: 43 mg/dL — ABNORMAL HIGH (ref 6–23)
Calcium: 9 mg/dL (ref 8.4–10.5)
Chloride: 107 mEq/L (ref 96–112)
Creatinine, Ser: 3.23 mg/dL — ABNORMAL HIGH (ref 0.50–1.10)
GFR calc Af Amer: 15 mL/min — ABNORMAL LOW (ref >90)
GFR calc non Af Amer: 13 mL/min — ABNORMAL LOW (ref >90)

## 2011-07-05 LAB — CBC
HCT: 23.6 % — ABNORMAL LOW (ref 36.0–46.0)
Hemoglobin: 7.8 g/dL — ABNORMAL LOW (ref 12.0–15.0)
MCH: 27.8 pg (ref 26.0–34.0)
MCHC: 33.1 g/dL (ref 30.0–36.0)
RDW: 16.1 % — ABNORMAL HIGH (ref 11.5–15.5)

## 2011-07-05 LAB — GLUCOSE, CAPILLARY: Glucose-Capillary: 153 mg/dL — ABNORMAL HIGH (ref 70–99)

## 2011-07-05 MED ORDER — ENOXAPARIN SODIUM 40 MG/0.4ML ~~LOC~~ SOLN: 40.0000 mg | SUBCUTANEOUS | Status: DC

## 2011-07-05 MED ORDER — METHOCARBAMOL 500 MG PO TABS: 500.0000 mg | ORAL_TABLET | Freq: Four times a day (QID) | ORAL | Status: AC | PRN

## 2011-07-05 MED ORDER — HYDROCODONE-ACETAMINOPHEN 5-325 MG PO TABS: 1.0000 | ORAL_TABLET | ORAL | Status: AC | PRN

## 2011-07-05 NOTE — Progress Notes (Signed)
Physical Therapy Treatment Patient Details Name: NEASIA GUIDROZ MRN: [id number] DOB: 09-13-1935 Today's Date: 07/05/2011  PT Assessment/Plan  PT - Assessment/Plan Comments on Treatment Session: Patient receiving blood upon arrival. Patient maxAx2 to scoot to [facility] for optimal positioning of CPM to R LE. Patient deferred OOB mobility due to increased pain in R knee and R UE. Patient with increased lethargy as well. patient with poor tolerance to  AROM and AAROM of R knee flexion. Patient to cont to benefit from skilled PT to maximize R LE functional return. PT Plan: Discharge plan remains appropriate PT Frequency: 7X/week Follow Up Recommendations: Skilled nursing facility PT Goals  Acute Rehab PT Goals PT Goal: Supine/Side to Sit - Progress: Progressing toward goal PT Goal: Sit to Stand - Progress: Progressing toward goal PT Goal: Ambulate - Progress: Progressing toward goal PT Goal: Perform Home Exercise Program - Progress: Met  PT Treatment Precautions/Restrictions  Precautions Precautions: Knee Precaution Booklet Issued: No Required Braces or Orthoses: No Restrictions Weight Bearing Restrictions: Yes RLE Weight Bearing: Weight bearing as tolerated Mobility (including Balance) Bed Mobility Bed Mobility: No (pt deferred due to increased pain in R knee and R arm at IV site)    Pain: 7/10  Exercise  Total Joint Exercises Ankle Circles/Pumps: AROM;Both;10 reps;Sidelying Quad Sets: AROM;Right;10 reps;Supine Heel Slides: AAROM;Right;20 reps (pt with poor tolerance, approx 20 degrees of knee flex) End of Session PT - End of Session Activity Tolerance: Patient limited by fatigue;Patient limited by pain Patient left: in CPM;in bed;with call bell in reach (CPM set to 70 degrees) General Behavior During Session: [facility] for tasks performed Cognition: [facility] for tasks performed  Kingsley Callander 07/05/2011, 12:55 PM  Kittie Plater, PT, DPT Pager #: [phone] Office #:  [phone]

## 2011-07-05 NOTE — Progress Notes (Addendum)
Pt is s/p R TKR. Knee precautions. +cms. Pt had an ace dressing to RLE that was sliding down the leg. Old dressing removed and new dressing applied. Pt incision well approximated with sutures. No s/sx infection. Pt noted to have a small amount of bldy drainage on old dressing. New tegaderm dressing applied to R knee. Ice prn RLE. RLE 1+ edema nonpitting.  Pt is to be WBAT with RW and yellow foam boot when not in CPM per order. Pt is alert and oriented x 3. Neuro check is negative. Lungs CTA in the upper lobes but noted to be diminished in the bases with sporadic fine wheezes of the bases. Pt sats 97% RA. Pt performs IS per order. Heart rate regular rate and rhythm. Vital signs stable. No s/sx cardiac or resp distress and no c/o such. Pt repts a continued cough with yellow phlegm. Pt repts LBM 1/27. Pt repts passing gas and denies nausea or vomiting. Abdomen soft flat nontender and nondistended. BS+x4. Pt received laxative yesterday and is receiving stool softners per order. Will re-administer laxative this AM per order. Pt repts a continued baseline of "dribbling" urine. Using clean and moist as needed for incontinence. Pt denies a changed in urinary status. No pressure skin issues noted of heels or sacral area. Pt can tilt self and floats heels. Plan is to d/c to SNF today for further rehab after receiving a unit of blood per order. After changing pts dressing, teds applied to operative leg as well.

## 2011-07-05 NOTE — Progress Notes (Signed)
Patient for d/c today to SNF bed at [place]. Patient and family agreeable to this plan- plan transfer via EMS. Eduard Clos, MSW, [place]

## 2011-07-05 NOTE — Progress Notes (Signed)
PATIENT ID:      [name]  MRN:     [id number] DOB/AGE:    1935-10-17 / 76 y.o.    PROGRESS NOTE Subjective:  negative for Chest Pain  negative for Shortness of Breath  negative for Nausea/Vomiting   negative for Calf Pain  negative for Bowel Movement   Tolerating Diet: yes         Patient reports pain as 5 on 0-10 scale.    Objective: Vital signs in last 24 hours:  Patient Vitals for the past 24 hrs:  BP Temp Temp src Pulse Resp SpO2 Height Weight  07/05/11 0615 - - - - - - 5\' 1"  (1.549 m) 109.8 kg (242 lb 1 oz)  07/05/11 0603 112/54 mmHg 99 F (37.2 C) - 74  22  97 % - -  07/04/11 2238 106/64 mmHg 101 F (38.3 C) Oral 68  18  90 % - -  07/04/11 1400 126/46 mmHg 98.7 F (37.1 C) - 69  16  93 % - -      Intake/Output from previous day:   01/30 0701 - 01/31 0700 In: 600 [P.O.:600] Out: -    Intake/Output this shift:       Intake/Output      01/30 0701 - 01/31 0700 01/31 0701 - 02/01 0700   P.O. 600    Total Intake(mL/kg) 600 (5.5)    Urine (mL/kg/hr)     Drains     Total Output     Net +600         Urine Occurrence 1 x       LABORATORY DATA:  Basename 07/05/11 0630 07/04/11 0535 07/03/11 0615  WBC 12.8* 14.1* 9.1  HGB 7.8* 8.5* 8.3*  HCT 23.6* 25.7* 25.1*  PLT 215 209 205    Basename 07/05/11 0630 07/04/11 0535 07/03/11 0615  NA 138 141 142  K 4.3 4.7 4.0  CL 107 109 109  CO2 22 22 20   BUN 43* 31* 27*  CREATININE 3.23* 2.48* 1.66*  GLUCOSE 154* 131* 142*  CALCIUM 9.0 9.4 8.7   Lab Results  Component Value Date   INR 1.11 06/21/2011   INR 1.03 04/25/2010    Examination:  General appearance: alert, cooperative and no distress Extremities: Homans sign is negative, no sign of DVT  Wound Exam: clean, dry, intact   Drainage:  None: wound tissue dry  Motor Exam EHL and FHL Intact  Sensory Exam Deep Peroneal normal  Assessment:    3 Days Post-Op  Procedure(s) (LRB): TOTAL KNEE ARTHROPLASTY (Right)  ADDITIONAL DIAGNOSIS:  Active  Problems:  * No active hospital problems. *   Acute Blood Loss Anemia   Plan: Physical Therapy as ordered Weight Bearing as Tolerated (WBAT)  DVT Prophylaxis:  Lovenox  DISCHARGE PLAN: Skilled Nursing Facility/Rehab  DISCHARGE NEEDS: HHPT, CPM, Walker and 3-in-1 comode seat         Phat Dalton 07/05/2011, 9:08 AM

## 2011-07-05 NOTE — Discharge Summary (Signed)
PATIENT ID:      [name]  MRN:     [id number] DOB/AGE:    March 21, 1936 / 76 y.o.     DISCHARGE SUMMARY  ADMISSION DATE:    07/02/2011 DISCHARGE DATE:   07/05/2011   ADMISSION DIAGNOSIS: osteoarthritis right knee  (osteoarthritis right knee)  DISCHARGE DIAGNOSIS:  osteoarthritis right knee    ADDITIONAL DIAGNOSIS: Active Problems:  * No active hospital problems. *   Past Medical History  Diagnosis Date  . Heart murmur   . Shortness of breath   . Anemia     takes iron  . Chronic kidney disease     stage III  . GERD (gastroesophageal reflux disease)   . Arthritis   . Cataracts, bilateral   . Diabetes mellitus   . Wears dentures     upper  . Wears glasses   . Urination frequency   . Sleep apnea     CPAP machine    PROCEDURE: Procedure(s): TOTAL KNEE ARTHROPLASTY on 07/02/2011  CONSULTS:     HISTORY:  See H&P in chart  HOSPITAL COURSE:  DASHLEY ROGILLIO is a 76 y.o. admitted on 07/02/2011 and found to have a diagnosis of osteoarthritis right knee.  After appropriate laboratory studies were obtained  they were taken to the operating room on 07/02/2011 and underwent Procedure(s): TOTAL KNEE ARTHROPLASTY.   They were given perioperative antibiotics:  Anti-infectives     Start     Dose/Rate Route Frequency Ordered Stop   07/02/11 1500   ceFAZolin (ANCEF) IVPB 2 g/50 mL premix        2 g 100 mL/hr over 30 Minutes Intravenous Every 6 hours 07/02/11 1337 07/03/11 0324   07/02/11 0715   ceFAZolin (ANCEF) IVPB 2 g/50 mL premix  Status:  Discontinued        2 g 100 mL/hr over 30 Minutes Intravenous 60 min pre-op 07/02/11 0709 07/02/11 0711   07/01/11 0945   ceFAZolin (ANCEF) IVPB 2 g/50 mL premix        2 g 100 mL/hr over 30 Minutes Intravenous 60 min pre-op 07/01/11 0936 07/02/11 0855        . Blood products given:none   The remainder of the hospital course was dedicated to ambulation and strengthening.   The patient was discharged on 3 Days Post-Op in  Good  condition.   DIAGNOSTIC STUDIES: Recent vital signs: Patient Vitals for the past 24 hrs:  BP Temp Temp src Pulse Resp SpO2 Height Weight  07/05/11 0615 - - - - - - 5\' 1"  (1.549 m) 109.8 kg (242 lb 1 oz)  07/05/11 0603 112/54 mmHg 99 F (37.2 C) - 74  22  97 % - -  07-14-2011 2238 106/64 mmHg 101 F (38.3 C) Oral 68  18  90 % - -  14-Jul-2011 1400 126/46 mmHg 98.7 F (37.1 C) - 69  16  93 % - -       Recent laboratory studies:  Basename 07/05/11 0630 July 14, 2011 0535 07/03/11 0615  WBC 12.8* 14.1* 9.1  HGB 7.8* 8.5* 8.3*  HCT 23.6* 25.7* 25.1*  PLT 215 209 205    Basename 07/05/11 0630 2011-07-14 0535 07/03/11 0615  NA 138 141 142  K 4.3 4.7 4.0  CL 107 109 109  CO2 22 22 20   BUN 43* 31* 27*  CREATININE 3.23* 2.48* 1.66*  GLUCOSE 154* 131* 142*  CALCIUM 9.0 9.4 8.7   Lab Results  Component Value Date   INR  1.11 06/21/2011   INR 1.03 04/25/2010     Recent Radiographic Studies :  Dg Chest 2 View  07/02/2011  *RADIOLOGY REPORT*  Clinical Data: Preop.  CHEST - 2 VIEW  Comparison: 05/01/2010 and 04/13/2007.  Findings: Trachea is midline.  Heart is mildly enlarged, stable. Hilar regions are minimally prominent as well, also stable.  Lungs are clear.  No pleural fluid.  Degenerative changes are seen in the spine and shoulders.  IMPRESSION: No acute findings.  Original Report Authenticated By: Luretha Rued, M.D.    DISCHARGE INSTRUCTIONS: Discharge Orders    Future Orders Please Complete By Expires   Diet - low sodium heart healthy      Call MD / Call 911      Comments:   If you experience chest pain or shortness of breath, CALL 911 and be transported to the hospital emergency room.  If you develope a fever above 101 F, pus (white drainage) or increased drainage or redness at the wound, or calf pain, call your surgeon's office.   Constipation Prevention      Comments:   Drink plenty of fluids.  Prune juice may be helpful.  You may use a stool softener, such as Colace (over the  counter) 100 mg twice a day.  Use MiraLax (over the counter) for constipation as needed.   Increase activity slowly as tolerated      Weight Bearing as taught in Physical Therapy      Comments:   Use a walker or crutches as instructed.   Driving restrictions      Comments:   No driving for 6 weeks   Lifting restrictions      Comments:   No lifting for 6 weeks   CPM      Comments:   Continuous passive motion machine (CPM):      Use the CPM from 0 to 90 for 6-8 hours per day.      You may increase by 10 per day.  You may break it up into 2 or 3 sessions per day.      Use CPM for 3 weeks or until you are told to stop.   TED hose      Comments:   Use stockings (TED hose) for 3 weeks on both leg(s).  You may remove them at night for sleeping.   Change dressing      Comments:   Change dressing on friday, then change the dressing daily with sterile 4 x 4 inch gauze dressing and apply TED hose.  You may clean the incision with alcohol prior to redressing.   Do not put a pillow under the knee. Place it under the heel.         DISCHARGE MEDICATIONS:   Medication List  As of 07/05/2011  8:56 AM   STOP taking these medications         acetaminophen 500 MG tablet      aspirin EC 81 MG tablet         TAKE these medications         atorvastatin 40 MG tablet   Commonly known as: LIPITOR   Take 40 mg by mouth at bedtime.      cholecalciferol 400 UNITS Tabs   Commonly known as: VITAMIN D   Take 400 Units by mouth daily.      diltiazem 300 MG 24 hr capsule   Commonly known as: TIAZAC   Take 300 mg by mouth daily.  enoxaparin 40 MG/0.4ML Soln   Commonly known as: LOVENOX   Inject 0.4 mLs (40 mg total) into the skin daily.      ezetimibe 10 MG tablet   Commonly known as: ZETIA   Take 10 mg by mouth at bedtime.      famotidine 40 MG tablet   Commonly known as: PEPCID   Take 40 mg by mouth daily.      HYDROcodone-acetaminophen 5-325 MG per tablet   Commonly known as: NORCO    Take 1-2 tablets by mouth every 4 (four) hours as needed.      IRON SUPPLEMENT 325 (65 FE) MG tablet   Generic drug: ferrous sulfate   Take 325 mg by mouth daily with breakfast.      methocarbamol 500 MG tablet   Commonly known as: ROBAXIN   Take 1 tablet (500 mg total) by mouth every 6 (six) hours as needed.      multivitamins ther. w/minerals Tabs   Take 1 tablet by mouth daily.      pioglitazone 30 MG tablet   Commonly known as: ACTOS   Take 30 mg by mouth daily.      valsartan-hydrochlorothiazide 320-25 MG per tablet   Commonly known as: DIOVAN-HCT   Take 1 tablet by mouth every morning.            FOLLOW UP VISIT:   Follow-up Information    Follow up with Rudean Haskell, MD. Call on 07/17/2011.   Contact information:   [place] [phone]          DISPOSITION:  Skilled nursing facility    CONDITION:  Good   Nanea Jared 07/05/2011, 8:56 AM

## 2011-07-06 LAB — TYPE AND SCREEN
ABO/RH(D): O NEG
Unit division: 0

## 2011-07-14 DIAGNOSIS — I10 Essential (primary) hypertension: Secondary | ICD-10-CM | POA: Diagnosis not present

## 2011-07-14 DIAGNOSIS — D62 Acute posthemorrhagic anemia: Secondary | ICD-10-CM | POA: Diagnosis not present

## 2011-07-14 DIAGNOSIS — N179 Acute kidney failure, unspecified: Secondary | ICD-10-CM | POA: Diagnosis not present

## 2011-09-12 DIAGNOSIS — Z96659 Presence of unspecified artificial knee joint: Secondary | ICD-10-CM | POA: Diagnosis not present

## 2012-01-02 ENCOUNTER — Other Ambulatory Visit (HOSPITAL_COMMUNITY): Payer: Self-pay | Admitting: *Deleted

## 2012-01-03 ENCOUNTER — Encounter (HOSPITAL_COMMUNITY)
Admission: RE | Admit: 2012-01-03 | Discharge: 2012-01-03 | Disposition: A | Discharge status: Home / Self Care | Payer: 59 | Source: Ambulatory Visit | Attending: Nephrology | Admitting: Nephrology

## 2012-01-03 DIAGNOSIS — D509 Iron deficiency anemia, unspecified: Secondary | ICD-10-CM | POA: Insufficient documentation

## 2012-01-03 MED ORDER — SODIUM CHLORIDE 0.9 % IV SOLN: INTRAVENOUS | Status: DC

## 2012-01-03 MED ORDER — FERUMOXYTOL INJECTION 510 MG/17 ML: 510.0000 mg | INTRAVENOUS | Status: DC

## 2012-01-03 NOTE — Progress Notes (Signed)
One Short Stay nurse and 2 IV nurses attempted unsuccessfully to start and IV on patient. Called [facility] and spoke with Jamal Maes, [place]. Orders received. Patient aware. Rescheduled for next week.

## 2012-01-08 ENCOUNTER — Other Ambulatory Visit (HOSPITAL_COMMUNITY): Payer: Self-pay | Admitting: *Deleted

## 2012-01-09 ENCOUNTER — Encounter (HOSPITAL_COMMUNITY)
Admission: RE | Admit: 2012-01-09 | Discharge: 2012-01-09 | Disposition: A | Discharge status: Home / Self Care | Payer: 59 | Source: Ambulatory Visit | Attending: Nephrology | Admitting: Nephrology

## 2012-01-09 MED ORDER — SODIUM CHLORIDE 0.9 % IV SOLN
INTRAVENOUS | Status: DC
  Administered 2012-01-09: 13:00:00 via INTRAVENOUS

## 2012-01-09 MED ORDER — FERUMOXYTOL INJECTION 510 MG/17 ML
510.0000 mg | INTRAVENOUS | Status: DC
  Administered 2012-01-09: 510 mg via INTRAVENOUS
  Filled 2012-01-09: qty 17

## 2012-01-09 NOTE — Progress Notes (Signed)
IV Therapy paged for IV access

## 2012-01-14 ENCOUNTER — Encounter (HOSPITAL_COMMUNITY): Payer: Self-pay | Admitting: *Deleted

## 2012-01-15 ENCOUNTER — Ambulatory Visit (HOSPITAL_COMMUNITY): Payer: 59 | Admitting: Certified Registered Nurse Anesthetist

## 2012-01-15 ENCOUNTER — Encounter (HOSPITAL_COMMUNITY)
Admission: RE | Disposition: A | Discharge status: Home / Self Care | Payer: Self-pay | Source: Ambulatory Visit | Attending: Gastroenterology

## 2012-01-15 ENCOUNTER — Encounter (HOSPITAL_COMMUNITY): Payer: Self-pay | Admitting: Certified Registered Nurse Anesthetist

## 2012-01-15 ENCOUNTER — Ambulatory Visit (HOSPITAL_COMMUNITY)
Admission: RE | Admit: 2012-01-15 | Discharge: 2012-01-15 | Disposition: A | Discharge status: Home / Self Care | Payer: 59 | Source: Ambulatory Visit | Attending: Gastroenterology | Admitting: Gastroenterology

## 2012-01-15 ENCOUNTER — Encounter (HOSPITAL_COMMUNITY): Payer: Self-pay | Admitting: *Deleted

## 2012-01-15 ENCOUNTER — Other Ambulatory Visit (HOSPITAL_COMMUNITY): Payer: Self-pay | Admitting: *Deleted

## 2012-01-15 DIAGNOSIS — Z79899 Other long term (current) drug therapy: Secondary | ICD-10-CM | POA: Insufficient documentation

## 2012-01-15 DIAGNOSIS — I10 Essential (primary) hypertension: Secondary | ICD-10-CM | POA: Insufficient documentation

## 2012-01-15 DIAGNOSIS — G4730 Sleep apnea, unspecified: Secondary | ICD-10-CM | POA: Insufficient documentation

## 2012-01-15 DIAGNOSIS — K2970 Gastritis, unspecified, without bleeding: Secondary | ICD-10-CM | POA: Insufficient documentation

## 2012-01-15 DIAGNOSIS — E119 Type 2 diabetes mellitus without complications: Secondary | ICD-10-CM | POA: Insufficient documentation

## 2012-01-15 DIAGNOSIS — K648 Other hemorrhoids: Secondary | ICD-10-CM | POA: Insufficient documentation

## 2012-01-15 DIAGNOSIS — D509 Iron deficiency anemia, unspecified: Secondary | ICD-10-CM | POA: Diagnosis present

## 2012-01-15 DIAGNOSIS — K5730 Diverticulosis of large intestine without perforation or abscess without bleeding: Secondary | ICD-10-CM | POA: Insufficient documentation

## 2012-01-15 DIAGNOSIS — N289 Disorder of kidney and ureter, unspecified: Secondary | ICD-10-CM | POA: Insufficient documentation

## 2012-01-15 DIAGNOSIS — K6289 Other specified diseases of anus and rectum: Secondary | ICD-10-CM | POA: Insufficient documentation

## 2012-01-15 HISTORY — PX: COLONOSCOPY: SHX5424

## 2012-01-15 HISTORY — PX: ESOPHAGOGASTRODUODENOSCOPY: SHX5428

## 2012-01-15 SURGERY — COLONOSCOPY
Anesthesia: Monitor Anesthesia Care

## 2012-01-15 MED ORDER — PROPOFOL 10 MG/ML IV EMUL
INTRAVENOUS | Status: DC | PRN
  Administered 2012-01-15: 100 ug/kg/min via INTRAVENOUS

## 2012-01-15 MED ORDER — LIDOCAINE HCL (CARDIAC) 20 MG/ML IV SOLN
INTRAVENOUS | Status: DC | PRN
  Administered 2012-01-15: 50 mg via INTRAVENOUS

## 2012-01-15 MED ORDER — LACTATED RINGERS IV SOLN
INTRAVENOUS | Status: DC | PRN
  Administered 2012-01-15: 09:00:00 via INTRAVENOUS

## 2012-01-15 MED ORDER — ONDANSETRON HCL 4 MG/2ML IJ SOLN
INTRAMUSCULAR | Status: DC | PRN
  Administered 2012-01-15: 4 mg via INTRAVENOUS

## 2012-01-15 MED ORDER — FENTANYL CITRATE 0.05 MG/ML IJ SOLN
INTRAMUSCULAR | Status: DC | PRN
  Administered 2012-01-15 (×2): 50 ug via INTRAVENOUS

## 2012-01-15 MED ORDER — SODIUM CHLORIDE 0.9 % IV SOLN: INTRAVENOUS | Status: DC

## 2012-01-15 MED ORDER — KETAMINE HCL 10 MG/ML IJ SOLN
INTRAMUSCULAR | Status: DC | PRN
  Administered 2012-01-15: 50 mg via INTRAVENOUS

## 2012-01-15 MED ORDER — MIDAZOLAM HCL 5 MG/5ML IJ SOLN
INTRAMUSCULAR | Status: DC | PRN
  Administered 2012-01-15: 2 mg via INTRAVENOUS

## 2012-01-15 MED ORDER — LACTATED RINGERS IV SOLN
INTRAVENOUS | Status: DC
  Administered 2012-01-15: 09:00:00 via INTRAVENOUS

## 2012-01-15 MED ORDER — BUTAMBEN-TETRACAINE-BENZOCAINE 2-2-14 % EX AERO
INHALATION_SPRAY | CUTANEOUS | Status: DC | PRN
  Administered 2012-01-15: 1 via TOPICAL

## 2012-01-15 NOTE — H&P (Signed)
  Date of Initial H&P: 12/20/11  History reviewed, patient examined, no change in status, stable for surgery.

## 2012-01-15 NOTE — Transfer of Care (Signed)
Immediate Anesthesia Transfer of Care Note  Patient: [name]  Procedure(s) Performed: Procedure(s) (LRB): COLONOSCOPY (N/A) ESOPHAGOGASTRODUODENOSCOPY (EGD) (N/A)  Patient Location: PACU  Anesthesia Type: MAC  Level of Consciousness: sedated, patient cooperative and responds to stimulaton  Airway & Oxygen Therapy: Patient Spontanous Breathing and Patient connected to face mask oxgen  Post-op Assessment: Report given to PACU RN and Post -op Vital signs reviewed and stable  Post vital signs: Reviewed and stable  Complications: No apparent anesthesia complications

## 2012-01-15 NOTE — Anesthesia Preprocedure Evaluation (Addendum)
Anesthesia Evaluation  Patient identified by MRN, date of birth, ID band Patient awake    Reviewed: Allergy & Precautions, H&P , NPO status , Patient's Chart, lab work & pertinent test results  Airway Mallampati: I  Neck ROM: full  Mouth opening: Limited Mouth Opening  Dental  (+) Dental Advisory Given and Edentulous Upper   Pulmonary shortness of breath, sleep apnea and Continuous Positive Airway Pressure Ventilation ,  breath sounds clear to auscultation  Pulmonary exam normal       Cardiovascular hypertension, Pt. on medications + Valvular Problems/Murmurs Rhythm:Regular Rate:Normal + Systolic murmurs    Neuro/Psych    GI/Hepatic Neg liver ROS, GERD-  Medicated and Controlled,  Endo/Other  Diabetes mellitus-, Well Controlled, Type 2, Oral Hypoglycemic AgentsMorbid obesity  Renal/GU Renal InsufficiencyRenal disease     Musculoskeletal   Abdominal (+) + obese,   Peds  Hematology negative hematology ROS (+)   Anesthesia Other Findings   Reproductive/Obstetrics                         Anesthesia Physical Anesthesia Plan  ASA: III  Anesthesia Plan: MAC and General   Post-op Pain Management:    Induction: Intravenous  Airway Management Planned: Mask and Nasal Cannula  Additional Equipment:   Intra-op Plan:   Post-operative Plan:   Informed Consent: I have reviewed the patients History and Physical, chart, labs and discussed the procedure including the risks, benefits and alternatives for the proposed anesthesia with the patient or authorized representative who has indicated his/her understanding and acceptance.   Dental advisory given  Plan Discussed with: CRNA and Surgeon  Anesthesia Plan Comments:         Anesthesia Quick Evaluation

## 2012-01-15 NOTE — Op Note (Signed)
[facility] [place], [place]  [address]  COLONOSCOPY PROCEDURE REPORT  PATIENT:  [name], [name]  MR#:  [id number] BIRTHDATE:  10-Oct-1935, 75 yrs. old  GENDER:  female ENDOSCOPIST:  Wilford Corner, MD REF. BY:   Leighton Ruff PROCEDURE DATE:  01/15/2012 PROCEDURE:  Colonoscopy ASA CLASS:  Class III INDICATIONS:   iron deficiency anemia MEDICATIONS:  See anesthesia report (propofol per anesthesia)  DESCRIPTION OF PROCEDURE:   After the risks benefits and alternatives of the procedure were thoroughly explained, informed consent was obtained.  The Pentax Ped Colon [id number] endoscope was introduced through the anus and advanced to the cecum where the ileocecal valve and appendiceal orifice were identified. In order to reach the cecum repeated loop reduction was necessary due to excessive looping and multiple diverticuli. The quality of the prep was adequate and good.  The instrument was then slowly withdrawn as the colon was fully examined. <<PROCEDUREIMAGES>>  FINDINGS:  Rectal exam unremarkable.  Pediatric colonoscope inserted into the colon and advanced to the cecum, where the appendiceal orifice and ileocecal valve were identified.    On insertion and withdrawal multiple diverticula were noted mainly in the sigmoid and descending colon. A few small diverticula were seen in ascending colon. On careful withdrawal of the colonoscope the diffuse diverticuli were again noted.   Retroflexion revealed small internal hemorrhoids and an anal nodule that looks benign.  COMPLICATIONS:  None  IMPRESSION:     1. Diffuse diverticulosis 2. Small internal hemorrhoids 3. Benign-appearing anal nodule  RECOMMENDATIONS:  1. High fiber diet 2. Repeat colonoscopy in 10 years 3. Refer to [facility] to evaluate anal nodule  ______________________________ Wilford Corner, MD  CC:  n. eSIGNEDWilford Corner at 01/15/2012 10:29 AM  Sheran Lawless, [id number]

## 2012-01-15 NOTE — Interval H&P Note (Signed)
History and Physical Interval Note:  01/15/2012 8:24 AM  [name]  has presented today for surgery, with the diagnosis of LOW IRON  The various methods of treatment have been discussed with the patient and family. After consideration of risks, benefits and other options for treatment, the patient has consented to  Procedure(s) (LRB): COLONOSCOPY (N/A) ESOPHAGOGASTRODUODENOSCOPY (EGD) (N/A) as a surgical intervention .  The patient's history has been reviewed, patient examined, no change in status, stable for surgery.  I have reviewed the patient's chart and labs.  Questions were answered to the patient's satisfaction.     [place] C.

## 2012-01-15 NOTE — Preoperative (Signed)
Beta Blockers   Reason not to administer Beta Blockers:Not Applicable 

## 2012-01-15 NOTE — Op Note (Signed)
[facility] [place], [place]  [address]  ENDOSCOPY PROCEDURE REPORT  PATIENT:  Tiffany, Olsen  MR#:  [id number] BIRTHDATE:  [birth year], 75 yrs. old  GENDER:  female  ENDOSCOPIST:  Wilford Corner, MD Referred by:   Leighton Ruff  PROCEDURE DATE:  01/15/2012 PROCEDURE:  EGD with biopsies ASA CLASS:  INDICATIONS:   Class III  Iron deficiency anemia  MEDICATIONS:  See anesthesia report  TOPICAL ANESTHETIC: Cetacaine spray x 1  DESCRIPTION OF PROCEDURE:   After the risks benefits and alternatives of the procedure were thoroughly explained, informed consent was obtained.  The [id number] endoscope was introduced through the mouth and advanced to the second portion of the duodenum , without limitations.  The instrument was slowly withdrawn as the mucosa was fully examined. <<PROCEDUREIMAGES>>  FINDINGS:  The endoscope was inserted into the oropharynx and esophagus was intubated.  The gastroesophageal junction was noted to be 42 cm from the incisors. Endoscope was advanced into the stomach which revealed scattered red and brown spots in the proximal stomach consistent with gastritis. No ulcers were seen. The endoscope was advanced to the duodenal bulb and second portion of duodenum which were unremarkable. Biopsies were taken in the second portion of the duodenum and duodenal bulb to send for histology and check for celiac sprue. The endoscope was withdrawn back into the stomach and retroflexion again revealed proximal gastritis in the fundus. The cardia was unremarkable.  COMPLICATIONS:  None  ENDOSCOPIC IMPRESSION: Mild gastritis otherwise normal upper endoscopy  RECOMMENDATIONS: Follow-up on duodenal biopsies to check for celiac sprue; Avoid NSAIDs; Start PPI QD  REPEAT EXAM:  N/A  ______________________________ Wilford Corner, MD  CC:  n. eSIGNEDWilford Corner at 01/15/2012 10:34 AM  Sheran Lawless, [id number]

## 2012-01-15 NOTE — Anesthesia Postprocedure Evaluation (Signed)
Anesthesia Post Note  Patient: [name]  Procedure(s) Performed: Procedure(s) (LRB): COLONOSCOPY (N/A) ESOPHAGOGASTRODUODENOSCOPY (EGD) (N/A)  Anesthesia type: MAC  Patient location: PACU  Post pain: Pain level controlled  Post assessment: Post-op Vital signs reviewed  Last Vitals: BP 152/79  Temp 36.5 C (Oral)  Resp 15  Ht 1' (0.305 m)  Wt 222 lb (100.699 kg)  BMI 1,083.92 kg/m2  SpO2 99%  Post vital signs: Reviewed  Level of consciousness: awake  Complications: No apparent anesthesia complications

## 2012-01-16 ENCOUNTER — Encounter (HOSPITAL_COMMUNITY): Payer: Self-pay | Admitting: Gastroenterology

## 2012-01-16 ENCOUNTER — Encounter (HOSPITAL_COMMUNITY)
Admission: RE | Admit: 2012-01-16 | Discharge: 2012-01-16 | Disposition: A | Discharge status: Home / Self Care | Payer: 59 | Source: Ambulatory Visit | Attending: Nephrology | Admitting: Nephrology

## 2012-01-16 MED ORDER — FERUMOXYTOL INJECTION 510 MG/17 ML
510.0000 mg | INTRAVENOUS | Status: AC
  Administered 2012-01-16: 510 mg via INTRAVENOUS
  Filled 2012-01-16: qty 17

## 2012-01-16 MED ORDER — SODIUM CHLORIDE 0.9 % IV SOLN
INTRAVENOUS | Status: AC
  Administered 2012-01-16: 14:00:00 via INTRAVENOUS

## 2012-03-31 ENCOUNTER — Other Ambulatory Visit: Payer: Self-pay | Admitting: Family Medicine

## 2012-06-06 DIAGNOSIS — R141 Gas pain: Secondary | ICD-10-CM | POA: Diagnosis not present

## 2012-06-06 DIAGNOSIS — R143 Flatulence: Secondary | ICD-10-CM | POA: Diagnosis not present

## 2012-06-06 DIAGNOSIS — R109 Unspecified abdominal pain: Secondary | ICD-10-CM | POA: Diagnosis not present

## 2012-06-06 DIAGNOSIS — R82998 Other abnormal findings in urine: Secondary | ICD-10-CM | POA: Diagnosis not present

## 2012-06-25 DIAGNOSIS — J069 Acute upper respiratory infection, unspecified: Secondary | ICD-10-CM | POA: Diagnosis not present

## 2012-08-06 DIAGNOSIS — E559 Vitamin D deficiency, unspecified: Secondary | ICD-10-CM | POA: Diagnosis not present

## 2012-08-06 DIAGNOSIS — R143 Flatulence: Secondary | ICD-10-CM | POA: Diagnosis not present

## 2012-08-06 DIAGNOSIS — E7800 Pure hypercholesterolemia, unspecified: Secondary | ICD-10-CM | POA: Diagnosis not present

## 2012-08-06 DIAGNOSIS — R142 Eructation: Secondary | ICD-10-CM | POA: Diagnosis not present

## 2012-08-12 DIAGNOSIS — IMO0002 Reserved for concepts with insufficient information to code with codable children: Secondary | ICD-10-CM | POA: Diagnosis not present

## 2012-08-13 DIAGNOSIS — R143 Flatulence: Secondary | ICD-10-CM | POA: Diagnosis not present

## 2012-08-13 DIAGNOSIS — R1084 Generalized abdominal pain: Secondary | ICD-10-CM | POA: Diagnosis not present

## 2012-08-13 DIAGNOSIS — R142 Eructation: Secondary | ICD-10-CM | POA: Diagnosis not present

## 2012-11-21 DIAGNOSIS — G4733 Obstructive sleep apnea (adult) (pediatric): Secondary | ICD-10-CM | POA: Diagnosis not present

## 2012-11-21 DIAGNOSIS — I10 Essential (primary) hypertension: Secondary | ICD-10-CM | POA: Diagnosis not present

## 2012-12-15 DIAGNOSIS — H2510 Age-related nuclear cataract, unspecified eye: Secondary | ICD-10-CM | POA: Diagnosis not present

## 2012-12-15 DIAGNOSIS — E119 Type 2 diabetes mellitus without complications: Secondary | ICD-10-CM | POA: Diagnosis not present

## 2013-02-27 DIAGNOSIS — I10 Essential (primary) hypertension: Secondary | ICD-10-CM | POA: Diagnosis not present

## 2013-02-27 DIAGNOSIS — Z23 Encounter for immunization: Secondary | ICD-10-CM | POA: Diagnosis not present

## 2013-02-27 DIAGNOSIS — E559 Vitamin D deficiency, unspecified: Secondary | ICD-10-CM | POA: Diagnosis not present

## 2013-02-27 DIAGNOSIS — Z0000 Encounter for general adult medical examination without abnormal findings: Secondary | ICD-10-CM | POA: Diagnosis not present

## 2013-02-27 DIAGNOSIS — D509 Iron deficiency anemia, unspecified: Secondary | ICD-10-CM | POA: Diagnosis not present

## 2013-02-27 DIAGNOSIS — N183 Chronic kidney disease, stage 3 unspecified: Secondary | ICD-10-CM | POA: Diagnosis not present

## 2013-02-27 DIAGNOSIS — E7800 Pure hypercholesterolemia, unspecified: Secondary | ICD-10-CM | POA: Diagnosis not present

## 2013-02-27 DIAGNOSIS — E119 Type 2 diabetes mellitus without complications: Secondary | ICD-10-CM | POA: Diagnosis not present

## 2013-02-27 DIAGNOSIS — Z1331 Encounter for screening for depression: Secondary | ICD-10-CM | POA: Diagnosis not present

## 2013-03-10 DIAGNOSIS — D649 Anemia, unspecified: Secondary | ICD-10-CM | POA: Diagnosis not present

## 2013-03-10 DIAGNOSIS — N2581 Secondary hyperparathyroidism of renal origin: Secondary | ICD-10-CM | POA: Diagnosis not present

## 2013-05-17 DIAGNOSIS — J069 Acute upper respiratory infection, unspecified: Secondary | ICD-10-CM | POA: Diagnosis not present

## 2013-05-21 ENCOUNTER — Encounter: Payer: Self-pay | Admitting: Cardiology

## 2013-05-21 DIAGNOSIS — K219 Gastro-esophageal reflux disease without esophagitis: Secondary | ICD-10-CM | POA: Insufficient documentation

## 2013-05-21 DIAGNOSIS — H269 Unspecified cataract: Secondary | ICD-10-CM | POA: Insufficient documentation

## 2013-05-21 DIAGNOSIS — R011 Cardiac murmur, unspecified: Secondary | ICD-10-CM | POA: Insufficient documentation

## 2013-05-21 DIAGNOSIS — N189 Chronic kidney disease, unspecified: Secondary | ICD-10-CM | POA: Insufficient documentation

## 2013-05-21 DIAGNOSIS — R0602 Shortness of breath: Secondary | ICD-10-CM | POA: Insufficient documentation

## 2013-05-21 DIAGNOSIS — M1990 Unspecified osteoarthritis, unspecified site: Secondary | ICD-10-CM | POA: Insufficient documentation

## 2013-05-22 ENCOUNTER — Ambulatory Visit: Payer: 59 | Admitting: Cardiology

## 2013-06-09 ENCOUNTER — Ambulatory Visit (INDEPENDENT_AMBULATORY_CARE_PROVIDER_SITE_OTHER): Payer: 59 | Admitting: Cardiology

## 2013-06-09 ENCOUNTER — Encounter: Payer: Self-pay | Admitting: Cardiology

## 2013-06-09 VITALS — BP 120/60 | HR 60 | Ht 61.5 in | Wt 228.0 lb

## 2013-06-09 DIAGNOSIS — G4733 Obstructive sleep apnea (adult) (pediatric): Secondary | ICD-10-CM | POA: Diagnosis not present

## 2013-06-09 DIAGNOSIS — I10 Essential (primary) hypertension: Secondary | ICD-10-CM

## 2013-06-09 DIAGNOSIS — E559 Vitamin D deficiency, unspecified: Secondary | ICD-10-CM | POA: Insufficient documentation

## 2013-06-09 DIAGNOSIS — Z6841 Body Mass Index (BMI) 40.0 and over, adult: Secondary | ICD-10-CM

## 2013-06-09 DIAGNOSIS — I272 Pulmonary hypertension, unspecified: Secondary | ICD-10-CM | POA: Insufficient documentation

## 2013-06-09 NOTE — Progress Notes (Signed)
[address], [place],   [address] Phone: [phone] Fax:  [phone]  Date:  06/09/2013   ID:  [name], DOB 17-Jan-1936, MRN [id number]  PCP:  Pcp Not In System  Cardiologist:  Dr. Irish Lack  Sleep Medicine:  Fransico Him, MD   History of Present Illness: [name] is a 78 y.o. female with a history of OSA, obesity and HTN who presents today for followup.  She is doing well.  She tolerates her CPAP device.  She has not been using it a lot recently due to increase phlegm production.  She uses a nasal pillow mask and tolerates it well.  She feels her pressure is adequate.  She has no daytime sleepiness and feels rested in the am.   Wt Readings from Last 3 Encounters:  06/09/13 228 lb (103.42 kg)  01/16/12 222 lb (100.699 kg)  01/14/12 222 lb (100.699 kg)     Past Medical History  Diagnosis Date  . Anemia     takes iron  . GERD (gastroesophageal reflux disease)   . Arthritis     Dr Ronnie Derby  . Cataracts, bilateral   . Diabetes mellitus   . Wears dentures     upper  . Wears glasses   . Urination frequency   . Shortness of breath     resolved with CPAP  . Heart murmur     Dr Irish Lack  . Chronic kidney disease     stage III Dr Mercy Moore  . Obstructive sleep apnea (adult) (pediatric)     w CPAP Severe OSA AHI 52/hr CPAP at 10cm H2O  . HTN (hypertension)   . Morbid obesity with BMI of 40.0-44.9, adult   . Hyperlipidemia   . Vitamin D deficiency disease   . Pulmonary HTN     mild with PASP 13mmHg echo 05/2012    Current Outpatient Prescriptions  Medication Sig Dispense Refill  . aspirin 81 MG tablet Take 81 mg by mouth daily.      Marland Kitchen atorvastatin (LIPITOR) 40 MG tablet Take 40 mg by mouth at bedtime.      . cholecalciferol (VITAMIN D) 400 UNITS TABS Take 400 Units by mouth daily.      Marland Kitchen diltiazem (TIAZAC) 300 MG 24 hr capsule Take 300 mg by mouth daily.      Marland Kitchen esomeprazole (NEXIUM) 40 MG capsule Take 40 mg by mouth daily at 12 noon.      .  ezetimibe (ZETIA) 10 MG tablet Take 10 mg by mouth at bedtime.      . furosemide (LASIX) 40 MG tablet       . Multiple Vitamins-Minerals (MULTIVITAMINS THER. W/MINERALS) TABS Take 1 tablet by mouth daily.      . pioglitazone (ACTOS) 30 MG tablet Take 30 mg by mouth daily.      . valsartan-hydrochlorothiazide (DIOVAN-HCT) 320-25 MG per tablet Take 1 tablet by mouth every morning.       No current facility-administered medications for this visit.    Allergies:    Allergies  Allergen Reactions  . Codeine Itching  . Ibuprofen Other (See Comments)    "gave her kidney trouble"    Social History:  The patient  reports that she has never smoked. She does not have any smokeless tobacco history on file. She reports that she does not drink alcohol or use illicit drugs.   Family History:  The patient's family history is not on file.   ROS:  Please see  the history of present illness.      All other systems reviewed and negative.   PHYSICAL EXAM: VS:  BP 120/60  Pulse 60  Ht 5' 1.5" (1.562 m)  Wt 228 lb (103.42 kg)  BMI 42.39 kg/m2 Well nourished, well developed, in no acute distress HEENT: normal Neck: no JVD Cardiac:  normal S1, S2; RRR; no murmur Lungs:  clear to auscultation bilaterally, no wheezing, rhonchi or rales Abd: soft, nontender, no hepatomegaly Ext: 1+ edema Skin: warm and dry Neuro:  CNs 2-12 intact, no focal abnormalities noted       ASSESSMENT AND PLAN:  1. OSA on CPAP and tolerating well but need to be more compliant  - she will bring her SD card to get a download 2. HTN - well controlled  - continue Tiazac/Diovan HCT 3. Obesity - I have encouraged her to continue her exercise regimen.  She was also counseled in a low fat diet and watch her portions  Followup with me in 6 months  Signed, Fransico Him, MD 06/09/2013 4:24 PM

## 2013-06-09 NOTE — Patient Instructions (Signed)
Your physician recommends that you continue on your current medications as directed. Please refer to the Current Medication list given to you today.  Your physician wants you to follow-up in: 6 months You will receive a reminder letter in the mail two months in advance. If you don't receive a letter, please call our office to schedule the follow-up appointment.  Please stop by the office this week with your cpap card

## 2013-06-15 DIAGNOSIS — E119 Type 2 diabetes mellitus without complications: Secondary | ICD-10-CM | POA: Diagnosis not present

## 2013-06-27 DIAGNOSIS — H1089 Other conjunctivitis: Secondary | ICD-10-CM | POA: Diagnosis not present

## 2013-07-06 ENCOUNTER — Encounter: Payer: Self-pay | Admitting: Cardiology

## 2013-07-17 ENCOUNTER — Encounter: Payer: Self-pay | Admitting: Cardiology

## 2013-08-13 DIAGNOSIS — R05 Cough: Secondary | ICD-10-CM | POA: Diagnosis not present

## 2013-08-13 DIAGNOSIS — R059 Cough, unspecified: Secondary | ICD-10-CM | POA: Diagnosis not present

## 2013-08-20 DIAGNOSIS — R059 Cough, unspecified: Secondary | ICD-10-CM | POA: Diagnosis not present

## 2013-08-20 DIAGNOSIS — R05 Cough: Secondary | ICD-10-CM | POA: Diagnosis not present

## 2013-08-27 DIAGNOSIS — R059 Cough, unspecified: Secondary | ICD-10-CM | POA: Diagnosis not present

## 2013-08-27 DIAGNOSIS — R05 Cough: Secondary | ICD-10-CM | POA: Diagnosis not present

## 2013-08-27 DIAGNOSIS — M5430 Sciatica, unspecified side: Secondary | ICD-10-CM | POA: Diagnosis not present

## 2013-10-07 DIAGNOSIS — N183 Chronic kidney disease, stage 3 unspecified: Secondary | ICD-10-CM | POA: Diagnosis not present

## 2013-10-07 DIAGNOSIS — I129 Hypertensive chronic kidney disease with stage 1 through stage 4 chronic kidney disease, or unspecified chronic kidney disease: Secondary | ICD-10-CM | POA: Diagnosis not present

## 2013-10-07 DIAGNOSIS — E1129 Type 2 diabetes mellitus with other diabetic kidney complication: Secondary | ICD-10-CM | POA: Diagnosis not present

## 2013-10-15 DIAGNOSIS — E119 Type 2 diabetes mellitus without complications: Secondary | ICD-10-CM | POA: Diagnosis not present

## 2013-10-15 DIAGNOSIS — R1110 Vomiting, unspecified: Secondary | ICD-10-CM | POA: Diagnosis not present

## 2013-10-15 DIAGNOSIS — E7800 Pure hypercholesterolemia, unspecified: Secondary | ICD-10-CM | POA: Diagnosis not present

## 2013-10-15 DIAGNOSIS — H20029 Recurrent acute iridocyclitis, unspecified eye: Secondary | ICD-10-CM | POA: Diagnosis not present

## 2013-10-15 DIAGNOSIS — R109 Unspecified abdominal pain: Secondary | ICD-10-CM | POA: Diagnosis not present

## 2013-10-16 DIAGNOSIS — H20029 Recurrent acute iridocyclitis, unspecified eye: Secondary | ICD-10-CM | POA: Diagnosis not present

## 2013-10-20 DIAGNOSIS — H20029 Recurrent acute iridocyclitis, unspecified eye: Secondary | ICD-10-CM | POA: Diagnosis not present

## 2013-10-20 DIAGNOSIS — H2510 Age-related nuclear cataract, unspecified eye: Secondary | ICD-10-CM | POA: Diagnosis not present

## 2013-10-21 DIAGNOSIS — IMO0001 Reserved for inherently not codable concepts without codable children: Secondary | ICD-10-CM | POA: Diagnosis not present

## 2013-10-21 DIAGNOSIS — E7800 Pure hypercholesterolemia, unspecified: Secondary | ICD-10-CM | POA: Diagnosis not present

## 2013-10-21 DIAGNOSIS — I10 Essential (primary) hypertension: Secondary | ICD-10-CM | POA: Diagnosis not present

## 2013-10-21 DIAGNOSIS — N183 Chronic kidney disease, stage 3 unspecified: Secondary | ICD-10-CM | POA: Diagnosis not present

## 2013-10-21 DIAGNOSIS — Z79899 Other long term (current) drug therapy: Secondary | ICD-10-CM | POA: Diagnosis not present

## 2013-10-23 DIAGNOSIS — J029 Acute pharyngitis, unspecified: Secondary | ICD-10-CM | POA: Diagnosis not present

## 2013-12-04 DIAGNOSIS — M9981 Other biomechanical lesions of cervical region: Secondary | ICD-10-CM | POA: Diagnosis not present

## 2013-12-09 DIAGNOSIS — H2510 Age-related nuclear cataract, unspecified eye: Secondary | ICD-10-CM | POA: Diagnosis not present

## 2013-12-09 DIAGNOSIS — H20029 Recurrent acute iridocyclitis, unspecified eye: Secondary | ICD-10-CM | POA: Diagnosis not present

## 2014-01-21 ENCOUNTER — Encounter: Payer: Self-pay | Admitting: Cardiology

## 2014-01-21 DIAGNOSIS — E7800 Pure hypercholesterolemia, unspecified: Secondary | ICD-10-CM | POA: Diagnosis not present

## 2014-01-21 DIAGNOSIS — E119 Type 2 diabetes mellitus without complications: Secondary | ICD-10-CM | POA: Diagnosis not present

## 2014-01-21 DIAGNOSIS — Z23 Encounter for immunization: Secondary | ICD-10-CM | POA: Diagnosis not present

## 2014-01-21 DIAGNOSIS — R635 Abnormal weight gain: Secondary | ICD-10-CM | POA: Diagnosis not present

## 2014-01-21 DIAGNOSIS — I10 Essential (primary) hypertension: Secondary | ICD-10-CM | POA: Diagnosis not present

## 2014-01-21 DIAGNOSIS — E1129 Type 2 diabetes mellitus with other diabetic kidney complication: Secondary | ICD-10-CM | POA: Diagnosis not present

## 2014-01-21 DIAGNOSIS — IMO0001 Reserved for inherently not codable concepts without codable children: Secondary | ICD-10-CM | POA: Diagnosis not present

## 2014-02-19 ENCOUNTER — Ambulatory Visit (INDEPENDENT_AMBULATORY_CARE_PROVIDER_SITE_OTHER): Payer: 59 | Admitting: Cardiology

## 2014-02-19 ENCOUNTER — Encounter: Payer: Self-pay | Admitting: Cardiology

## 2014-02-19 ENCOUNTER — Other Ambulatory Visit: Payer: Self-pay | Admitting: General Surgery

## 2014-02-19 VITALS — BP 126/80 | HR 62 | Ht 61.5 in | Wt 227.0 lb

## 2014-02-19 DIAGNOSIS — G4733 Obstructive sleep apnea (adult) (pediatric): Secondary | ICD-10-CM | POA: Diagnosis not present

## 2014-02-19 DIAGNOSIS — I10 Essential (primary) hypertension: Secondary | ICD-10-CM | POA: Diagnosis not present

## 2014-02-19 DIAGNOSIS — I272 Pulmonary hypertension, unspecified: Secondary | ICD-10-CM

## 2014-02-19 DIAGNOSIS — I2789 Other specified pulmonary heart diseases: Secondary | ICD-10-CM | POA: Diagnosis not present

## 2014-02-19 DIAGNOSIS — Z6841 Body Mass Index (BMI) 40.0 and over, adult: Secondary | ICD-10-CM

## 2014-02-19 NOTE — Progress Notes (Signed)
[name], [name]  [address] Phone: [phone] Fax:  [phone]  Date:  02/19/2014   ID:  AADVIKA Olsen, DOB 1935/12/18, MRN [id number]  PCP:  Pcp Not In System  Cardiologist:  Fransico Him, MD    History of Present Illness: [name] is a 78 y.o. female with a history of OSA, obesity moderate pulmonary HTN, diastolic dysfunction and HTN who presents today for followup. She is doing well but has not using her CPAP device.  She has only used it 4 days in the past 180days. She uses a nasal pillow mask and tolerates it well. She feels her pressure is adequate. She has no daytime sleepiness and feels rested in the am. She denies any chest pain, SOB, DOE or palpitations.  Rarely she will have some mild LE edema of her ankles during the day.   Wt Readings from Last 3 Encounters:  02/19/14 227 lb (102.967 kg)  06/09/13 228 lb (103.42 kg)  01/16/12 222 lb (100.699 kg)     Past Medical History  Diagnosis Date  . Anemia     takes iron  . GERD (gastroesophageal reflux disease)   . Arthritis     Dr Ronnie Derby  . Cataracts, bilateral   . Diabetes mellitus   . Wears dentures     upper  . Wears glasses   . Urination frequency   . Shortness of breath     resolved with CPAP  . Heart murmur     Dr Irish Lack  . Chronic kidney disease     stage III Dr Mercy Moore  . Obstructive sleep apnea (adult) (pediatric)     w CPAP Severe OSA AHI 52/hr CPAP at 10cm H2O  . HTN (hypertension)   . Morbid obesity with BMI of 40.0-44.9, adult   . Hyperlipidemia   . Vitamin D deficiency disease   . Pulmonary HTN     mild with PASP 66mmHg echo 05/2012    Current Outpatient Prescriptions  Medication Sig Dispense Refill  . aspirin 81 MG tablet Take 81 mg by mouth daily.      Marland Kitchen atorvastatin (LIPITOR) 40 MG tablet Take 40 mg by mouth at bedtime.      . cholecalciferol (VITAMIN D) 400 UNITS TABS Take 400 Units by mouth daily.      Marland Kitchen diltiazem (TIAZAC) 300 MG 24 hr capsule Take  300 mg by mouth daily.      Marland Kitchen esomeprazole (NEXIUM) 40 MG capsule Take 40 mg by mouth daily at 12 noon.      . ezetimibe (ZETIA) 10 MG tablet Take 10 mg by mouth at bedtime.      . furosemide (LASIX) 40 MG tablet       . Multiple Vitamins-Minerals (MULTIVITAMINS THER. W/MINERALS) TABS Take 1 tablet by mouth daily.      . pioglitazone (ACTOS) 30 MG tablet Take 30 mg by mouth daily.      . valsartan (DIOVAN) 320 MG tablet Take 320 mg by mouth daily.       No current facility-administered medications for this visit.    Allergies:    Allergies  Allergen Reactions  . Codeine Itching  . Ibuprofen Other (See Comments)    "gave her kidney trouble"    Social History:  The patient  reports that she has never smoked. She does not have any smokeless tobacco history on file. She reports that she does not drink alcohol or use illicit drugs.   Family  History:  The patient's family history is not on file.   ROS:  Please see the history of present illness.      All other systems reviewed and negative.   PHYSICAL EXAM: VS:  BP 126/80  Pulse 62  Ht 5' 1.5" (1.562 m)  Wt 227 lb (102.967 kg)  BMI 42.20 kg/m2 Well nourished, well developed, in no acute distress HEENT: normal Neck: no JVD Cardiac:  normal S1, S2; RRR; no murmur Lungs:  clear to auscultation bilaterally, no wheezing, rhonchi or rales Abd: soft, nontender, no hepatomegaly Ext: no edema Skin: warm and dry Neuro:  CNs 2-12 intact, no focal abnormalities noted   ASSESSMENT AND PLAN:  1. OSA on CPAP- her d/l today showed an AHI of 0.3/hr on 10cm H2O but only 3% compliance in using her device.  I have encouraged her to start back on her CPAP and discussed the role of sleep apnea in pulmonary HTN and stressed that the OSA needs to be treated 2. HTN - well controlled - continue Tiazac/Diovan HCT  3. Obesity - I have encouraged her to continue her exercise regimen. She was also counseled in a low fat diet and watch her  portions 4. Pulmonary HTN secondary to obesity, OSA and diastolic dysfunction - repeat 2D echo 5. Diastolic dysfunction on echo  Followup with me in 6 months     Signed, Fransico Him, MD [facility] 02/19/2014 3:14 PM

## 2014-02-19 NOTE — Patient Instructions (Signed)
Your physician recommends that you continue on your current medications as directed. Please refer to the Current Medication list given to you today.  Your physician has requested that you have an echocardiogram. Echocardiography is a painless test that uses sound waves to create images of your heart. It provides your doctor with information about the size and shape of your heart and how well your heart's chambers and valves are working. This procedure takes approximately one hour. There are no restrictions for this procedure.  Your physician wants you to follow-up in: 6 months with Dr Mallie Snooks will receive a reminder letter in the mail two months in advance. If you don't receive a letter, please call our office to schedule the follow-up appointment.

## 2014-02-25 ENCOUNTER — Other Ambulatory Visit (HOSPITAL_COMMUNITY): Payer: 59

## 2014-02-26 ENCOUNTER — Ambulatory Visit (HOSPITAL_COMMUNITY): Payer: 59 | Attending: Cardiology | Admitting: Radiology

## 2014-02-26 DIAGNOSIS — E785 Hyperlipidemia, unspecified: Secondary | ICD-10-CM | POA: Insufficient documentation

## 2014-02-26 DIAGNOSIS — G4733 Obstructive sleep apnea (adult) (pediatric): Secondary | ICD-10-CM | POA: Diagnosis not present

## 2014-02-26 DIAGNOSIS — N189 Chronic kidney disease, unspecified: Secondary | ICD-10-CM | POA: Diagnosis not present

## 2014-02-26 DIAGNOSIS — R0602 Shortness of breath: Secondary | ICD-10-CM | POA: Diagnosis not present

## 2014-02-26 DIAGNOSIS — I2789 Other specified pulmonary heart diseases: Secondary | ICD-10-CM | POA: Diagnosis not present

## 2014-02-26 DIAGNOSIS — I129 Hypertensive chronic kidney disease with stage 1 through stage 4 chronic kidney disease, or unspecified chronic kidney disease: Secondary | ICD-10-CM | POA: Diagnosis not present

## 2014-02-26 DIAGNOSIS — R011 Cardiac murmur, unspecified: Secondary | ICD-10-CM

## 2014-02-26 DIAGNOSIS — I272 Pulmonary hypertension, unspecified: Secondary | ICD-10-CM

## 2014-02-26 NOTE — Progress Notes (Signed)
Echocardiogram performed.  

## 2014-03-01 ENCOUNTER — Telehealth: Payer: Self-pay | Admitting: Cardiology

## 2014-03-01 NOTE — Telephone Encounter (Signed)
New message ° ° ° ° ° °Returning a nurses call °

## 2014-03-02 DIAGNOSIS — N183 Chronic kidney disease, stage 3 unspecified: Secondary | ICD-10-CM | POA: Diagnosis not present

## 2014-03-02 DIAGNOSIS — E559 Vitamin D deficiency, unspecified: Secondary | ICD-10-CM | POA: Diagnosis not present

## 2014-03-02 DIAGNOSIS — Z0000 Encounter for general adult medical examination without abnormal findings: Secondary | ICD-10-CM | POA: Diagnosis not present

## 2014-03-02 DIAGNOSIS — Z23 Encounter for immunization: Secondary | ICD-10-CM | POA: Diagnosis not present

## 2014-03-02 DIAGNOSIS — I10 Essential (primary) hypertension: Secondary | ICD-10-CM | POA: Diagnosis not present

## 2014-03-02 DIAGNOSIS — J069 Acute upper respiratory infection, unspecified: Secondary | ICD-10-CM | POA: Diagnosis not present

## 2014-03-02 DIAGNOSIS — Z1331 Encounter for screening for depression: Secondary | ICD-10-CM | POA: Diagnosis not present

## 2014-03-02 NOTE — Telephone Encounter (Signed)
Pt is aware of ECHO report

## 2014-03-02 NOTE — Telephone Encounter (Signed)
LMTRC

## 2014-03-18 DIAGNOSIS — Z13820 Encounter for screening for osteoporosis: Secondary | ICD-10-CM | POA: Diagnosis not present

## 2014-04-19 DIAGNOSIS — I129 Hypertensive chronic kidney disease with stage 1 through stage 4 chronic kidney disease, or unspecified chronic kidney disease: Secondary | ICD-10-CM | POA: Diagnosis not present

## 2014-04-19 DIAGNOSIS — N183 Chronic kidney disease, stage 3 (moderate): Secondary | ICD-10-CM | POA: Diagnosis not present

## 2014-04-19 DIAGNOSIS — E1129 Type 2 diabetes mellitus with other diabetic kidney complication: Secondary | ICD-10-CM | POA: Diagnosis not present

## 2014-05-05 DIAGNOSIS — R1032 Left lower quadrant pain: Secondary | ICD-10-CM | POA: Diagnosis not present

## 2014-05-07 DIAGNOSIS — R1032 Left lower quadrant pain: Secondary | ICD-10-CM | POA: Diagnosis not present

## 2014-06-06 ENCOUNTER — Encounter (HOSPITAL_COMMUNITY): Payer: Self-pay | Admitting: Emergency Medicine

## 2014-06-06 ENCOUNTER — Emergency Department (HOSPITAL_COMMUNITY)
Admission: EM | Admit: 2014-06-06 | Discharge: 2014-06-06 | Disposition: A | Discharge status: Home / Self Care | Payer: 59 | Attending: Emergency Medicine | Admitting: Emergency Medicine

## 2014-06-06 DIAGNOSIS — K219 Gastro-esophageal reflux disease without esophagitis: Secondary | ICD-10-CM | POA: Diagnosis not present

## 2014-06-06 DIAGNOSIS — Z862 Personal history of diseases of the blood and blood-forming organs and certain disorders involving the immune mechanism: Secondary | ICD-10-CM | POA: Insufficient documentation

## 2014-06-06 DIAGNOSIS — H938X1 Other specified disorders of right ear: Secondary | ICD-10-CM | POA: Diagnosis not present

## 2014-06-06 DIAGNOSIS — I129 Hypertensive chronic kidney disease with stage 1 through stage 4 chronic kidney disease, or unspecified chronic kidney disease: Secondary | ICD-10-CM | POA: Diagnosis not present

## 2014-06-06 DIAGNOSIS — Z9981 Dependence on supplemental oxygen: Secondary | ICD-10-CM | POA: Insufficient documentation

## 2014-06-06 DIAGNOSIS — H9481 Other specified disorders of right ear in diseases classified elsewhere: Secondary | ICD-10-CM | POA: Diagnosis not present

## 2014-06-06 DIAGNOSIS — E119 Type 2 diabetes mellitus without complications: Secondary | ICD-10-CM | POA: Insufficient documentation

## 2014-06-06 DIAGNOSIS — E559 Vitamin D deficiency, unspecified: Secondary | ICD-10-CM | POA: Diagnosis not present

## 2014-06-06 DIAGNOSIS — Z79899 Other long term (current) drug therapy: Secondary | ICD-10-CM | POA: Insufficient documentation

## 2014-06-06 DIAGNOSIS — E785 Hyperlipidemia, unspecified: Secondary | ICD-10-CM | POA: Insufficient documentation

## 2014-06-06 DIAGNOSIS — Z7982 Long term (current) use of aspirin: Secondary | ICD-10-CM | POA: Diagnosis not present

## 2014-06-06 DIAGNOSIS — G4733 Obstructive sleep apnea (adult) (pediatric): Secondary | ICD-10-CM | POA: Diagnosis not present

## 2014-06-06 DIAGNOSIS — Z973 Presence of spectacles and contact lenses: Secondary | ICD-10-CM | POA: Diagnosis not present

## 2014-06-06 DIAGNOSIS — M1990 Unspecified osteoarthritis, unspecified site: Secondary | ICD-10-CM | POA: Insufficient documentation

## 2014-06-06 DIAGNOSIS — R011 Cardiac murmur, unspecified: Secondary | ICD-10-CM | POA: Insufficient documentation

## 2014-06-06 DIAGNOSIS — N189 Chronic kidney disease, unspecified: Secondary | ICD-10-CM | POA: Insufficient documentation

## 2014-06-06 NOTE — Discharge Instructions (Signed)
Your ear exam today was normal.  No signs of infection, wax buildup, or blockage were noted.  No specific cause for your symptoms was found.  It should go away on its own.  If it does not or if it worsens, follow up with either ENT or your primary care doctor.

## 2014-06-06 NOTE — ED Provider Notes (Signed)
CSN: [id number]     Arrival date & time 06/06/14  0010 History   First MD Initiated Contact with Patient 06/06/14 0022     Chief Complaint  Patient presents with  . Ear Problem     (Consider location/radiation/quality/duration/timing/severity/associated sxs/prior Treatment) HPI 79 year old female presents to emergency department with complaint of hearing her heartbeat in her right ear.  Symptoms started this evening.  She has no pain or fullness sensation to the right.  No prior history of same.  Patient reports that she had her hair washed on Thursday, and thinks she may have gotten some water into it.  She denies any weakness, numbness, facial droop, facial pain, history of carotid problems or other complaints. Past Medical History  Diagnosis Date  . Anemia     takes iron  . GERD (gastroesophageal reflux disease)   . Arthritis     Dr Ronnie Derby  . Cataracts, bilateral   . Diabetes mellitus   . Wears dentures     upper  . Wears glasses   . Urination frequency   . Shortness of breath     resolved with CPAP  . Heart murmur     Dr Irish Lack  . Chronic kidney disease     stage III Dr Mercy Moore  . Obstructive sleep apnea (adult) (pediatric)     w CPAP Severe OSA AHI 52/hr CPAP at 10cm H2O  . HTN (hypertension)   . Morbid obesity with BMI of 40.0-44.9, adult   . Hyperlipidemia   . Vitamin D deficiency disease   . Pulmonary HTN     mild with PASP 53mmHg echo 05/2012   Past Surgical History  Procedure Laterality Date  . Total knee arthroplasty      Left  . Abdominal hysterectomy      partial  . Appendectomy    . US echocardiography      2012  . Sleep study  2012  . Cardiovascular stress test  2011  . Total knee arthroplasty  07/02/2011    Procedure: TOTAL KNEE ARTHROPLASTY;  Surgeon: Rudean Haskell, MD;  Location: [facility];  Service: Orthopedics;  Laterality: Right;  . Joint replacement      bilateral  . Colonoscopy  01/15/2012    Procedure: COLONOSCOPY;  Surgeon: Lear Ng, MD;  Location: [facility];  Service: Endoscopy;  Laterality: N/A;  . Esophagogastroduodenoscopy  01/15/2012    Procedure: ESOPHAGOGASTRODUODENOSCOPY (EGD);  Surgeon: Lear Ng, MD;  Location: [facility];  Service: Endoscopy;  Laterality: N/A;   No family history on file. History  Substance Use Topics  . Smoking status: Never Smoker   . Smokeless tobacco: Not on file  . Alcohol Use: No   OB History    No data available     Review of Systems   See History of Present Illness; otherwise all other systems are reviewed and negative  Allergies  Codeine and Ibuprofen  Home Medications   Prior to Admission medications   Medication Sig Start Date End Date Taking? Authorizing Provider  acetaminophen (TYLENOL) 500 MG tablet Take 500 mg by mouth every 6 (six) hours as needed for moderate pain.   Yes Historical Provider, MD  aspirin 81 MG tablet Take 81 mg by mouth daily.   Yes Historical Provider, MD  atorvastatin (LIPITOR) 40 MG tablet Take 40 mg by mouth at bedtime.   Yes Historical Provider, MD  Cholecalciferol (VITAMIN D) 2000 UNITS CAPS Take 2,000 Units by mouth daily.   Yes Historical Provider,  MD  diltiazem (TIAZAC) 300 MG 24 hr capsule Take 300 mg by mouth daily.   Yes Historical Provider, MD  esomeprazole (NEXIUM) 40 MG capsule Take 40 mg by mouth daily at 12 noon.   Yes Historical Provider, MD  ezetimibe (ZETIA) 10 MG tablet Take 10 mg by mouth at bedtime.   Yes Historical Provider, MD  furosemide (LASIX) 40 MG tablet Take 40 mg by mouth daily.  04/17/13  Yes Historical Provider, MD  Multiple Vitamins-Minerals (MULTIVITAMINS THER. W/MINERALS) TABS Take 1 tablet by mouth daily.   Yes Historical Provider, MD  pioglitazone (ACTOS) 30 MG tablet Take 30 mg by mouth daily.   Yes Historical Provider, MD  valsartan (DIOVAN) 320 MG tablet Take 320 mg by mouth daily.   Yes Historical Provider, MD   BP 151/75 mmHg  Pulse 53  Temp(Src) 97.6 F (36.4 C) (Oral)  Resp  18  Ht 5\' 1"  (1.549 m)  Wt 232 lb (105.235 kg)  BMI 43.86 kg/m2  SpO2 100% Physical Exam  Constitutional: She appears well-developed and well-nourished. No distress.  HENT:  Head: Normocephalic and atraumatic.  Right Ear: External ear normal.  Left Ear: External ear normal.  Nose: Nose normal.  Mouth/Throat: Oropharynx is clear and moist.  Eyes: Conjunctivae and EOM are normal. Pupils are equal, round, and reactive to light.  Neck: Normal range of motion. Neck supple. No JVD present. No tracheal deviation present. No thyromegaly present.  No carotid bruits noted  Cardiovascular: Normal rate, regular rhythm, normal heart sounds and intact distal pulses.  Exam reveals no gallop and no friction rub.   No murmur heard. Pulmonary/Chest: Effort normal and breath sounds normal. No stridor. No respiratory distress. She has no wheezes. She has no rales. She exhibits no tenderness.  Musculoskeletal: Normal range of motion. She exhibits no edema or tenderness.  Lymphadenopathy:    She has no cervical adenopathy.  Skin: Skin is warm and dry. No rash noted. No erythema. No pallor.  Psychiatric: She has a normal mood and affect. Her behavior is normal. Judgment and thought content normal.  Nursing note and vitals reviewed.   ED Course  Procedures (including critical care time) Labs Review Labs Reviewed - No data to display  Imaging Review No results found.   EKG Interpretation None      MDM   Final diagnoses:  Abnormal ear sensation, right     79 year old female with abnormal sensation to the right ear with pulsation.  Physical exam is normal.  Doubt serious medical issue at this time.  Patient had 1 cc of rubbing alcohol instilled into the right ear indication that she does have some residual water.  Patient instructed.  Follow-up with primary care doctor.  Kalman Drape, MD 06/06/14 [phone]

## 2014-06-06 NOTE — ED Notes (Signed)
Pt states that she can hear her heartbeat in her rt ear.  Denies pain.  Got her hair washed at the salon on Thursday and thinks she may have gotten water in her ear.

## 2014-06-17 DIAGNOSIS — H2513 Age-related nuclear cataract, bilateral: Secondary | ICD-10-CM | POA: Diagnosis not present

## 2014-06-17 DIAGNOSIS — E119 Type 2 diabetes mellitus without complications: Secondary | ICD-10-CM | POA: Diagnosis not present

## 2014-06-30 DIAGNOSIS — Z96652 Presence of left artificial knee joint: Secondary | ICD-10-CM | POA: Diagnosis not present

## 2014-06-30 DIAGNOSIS — M25572 Pain in left ankle and joints of left foot: Secondary | ICD-10-CM | POA: Diagnosis not present

## 2014-06-30 DIAGNOSIS — Z471 Aftercare following joint replacement surgery: Secondary | ICD-10-CM | POA: Diagnosis not present

## 2014-07-20 DIAGNOSIS — M533 Sacrococcygeal disorders, not elsewhere classified: Secondary | ICD-10-CM | POA: Diagnosis not present

## 2014-07-28 DIAGNOSIS — M25572 Pain in left ankle and joints of left foot: Secondary | ICD-10-CM | POA: Diagnosis not present

## 2014-08-18 NOTE — Progress Notes (Signed)
Cardiology Office Note   Date:  08/19/2014   ID:  [name], DOB [birth year], MRN [id number]  PCP:  Tiffany Heck, MD  Cardiologist:   Tiffany Margarita, MD   Chief Complaint  Patient presents with  . Sleep Apnea  . Hypertension  . Obesity      History of Present Illness: [name] is a 79 y.o. female with a history of OSA, obesity, moderate pulmonary HTN, diastolic dysfunction and HTN who presents today for followup. She is doing well but has not using her CPAP device. She uses a nasal pillow mask and tolerates it well. She feels her pressure is adequate. She has no daytime sleepiness and feels rested in the am.  She has no daytime sleepiness or dryness of her mouth or nose.   She denies any chest pain, SOB or palpitations. She occasionally will have some mild DOE with extreme exertion.  Rarely she will have some mild LE edema of her ankles during the day    Past Medical History  Diagnosis Date  . Anemia     takes iron  . GERD (gastroesophageal reflux disease)   . Arthritis     Dr [name]  . Cataracts, bilateral   . Diabetes mellitus   . Wears dentures     upper  . Wears glasses   . Urination frequency   . Shortness of breath     resolved with CPAP  . Heart murmur     Dr [name]  . Chronic kidney disease     stage III Dr [name]  . Obstructive sleep apnea (adult) (pediatric)     w CPAP Severe OSA AHI 52/hr CPAP at 10cm H2O  . HTN (hypertension)   . Morbid obesity with BMI of 40.0-44.9, adult   . Hyperlipidemia   . Vitamin D deficiency disease   . Pulmonary HTN     mild with PASP 26mmHg echo 05/2012  . Right carotid bruit 08/19/2014    Past Surgical History  Procedure Laterality Date  . Total knee arthroplasty      Left  . Abdominal hysterectomy      partial  . Appendectomy    . US echocardiography      2012  . Sleep study  2012  . Cardiovascular stress test  2011  . Total knee arthroplasty  07/02/2011    Procedure: TOTAL KNEE  ARTHROPLASTY;  Surgeon: Rudean Haskell, MD;  Location: [facility];  Service: Orthopedics;  Laterality: Right;  . Joint replacement      bilateral  . Colonoscopy  01/15/2012    Procedure: COLONOSCOPY;  Surgeon: Lear Ng, MD;  Location: [facility];  Service: Endoscopy;  Laterality: N/A;  . Esophagogastroduodenoscopy  01/15/2012    Procedure: ESOPHAGOGASTRODUODENOSCOPY (EGD);  Surgeon: Lear Ng, MD;  Location: [facility];  Service: Endoscopy;  Laterality: N/A;     Current Outpatient Prescriptions  Medication Sig Dispense Refill  . acetaminophen (TYLENOL) 500 MG tablet Take 500 mg by mouth every 6 (six) hours as needed for moderate pain.    Marland Kitchen aspirin 81 MG tablet Take 81 mg by mouth daily.    Marland Kitchen atorvastatin (LIPITOR) 40 MG tablet Take 40 mg by mouth at bedtime.    . Cholecalciferol (VITAMIN D) 2000 UNITS CAPS Take 2,000 Units by mouth daily.    Marland Kitchen diltiazem (TIAZAC) 300 MG 24 hr capsule Take 300 mg by mouth daily.    Marland Kitchen esomeprazole (NEXIUM) 40 MG capsule Take 40 mg by  mouth daily at 12 noon.    . ezetimibe (ZETIA) 10 MG tablet Take 10 mg by mouth at bedtime.    . furosemide (LASIX) 40 MG tablet Take 40 mg by mouth daily.     . Multiple Vitamins-Minerals (MULTIVITAMINS THER. W/MINERALS) TABS Take 1 tablet by mouth daily.    . pioglitazone (ACTOS) 30 MG tablet Take 30 mg by mouth daily.    . valsartan (DIOVAN) 320 MG tablet Take 320 mg by mouth daily.     No current facility-administered medications for this visit.    Allergies:   Codeine and Ibuprofen    Social History:  The patient  reports that she has never smoked. She does not have any smokeless tobacco history on file. She reports that she does not drink alcohol or use illicit drugs.   Family History:  The patient's family history includes Diabetes in her father and mother.    ROS:  Please see the history of present illness.   Otherwise, review of systems are positive for none.   All other systems are reviewed  and negative.    PHYSICAL EXAM: VS:  BP 136/78 mmHg  Pulse 51  Ht 5\' 1"  (1.549 m)  Wt 228 lb (103.42 kg)  BMI 43.10 kg/m2 , BMI Body mass index is 43.1 kg/(m^2). GEN: Well nourished, well developed, in no acute distress HEENT: normal Neck: right carotid bruit Cardiac: RRR; no murmurs, rubs, or gallops,no edema  Respiratory:  clear to auscultation bilaterally, normal work of breathing GI: soft, nontender, nondistended, + BS MS: no deformity or atrophy Skin: warm and dry, no rash Neuro:  Strength and sensation are intact Psych: euthymic mood, full affect   EKG:  EKG is not ordered today.    Recent Labs: No results found for requested labs within last 365 days.    Lipid Panel No results found for: CHOL, TRIG, HDL, CHOLHDL, VLDL, LDLCALC, LDLDIRECT    Wt Readings from Last 3 Encounters:  08/19/14 228 lb (103.42 kg)  06/06/14 232 lb (105.235 kg)  02/19/14 227 lb (102.967 kg)    ASSESSMENT AND PLAN:  2. OSA on CPAP- I will get a d/l from her DME 3. HTN - well controlled - continue Tiazac/Diovan HCT  3. Obesity - I have encouraged her to continue her exercise regimen. She was also counseled in a low fat diet and watch her portions 4. Mild pulmonary HTN secondary to obesity, OSA and diastolic dysfunction - stable by last echo 5.   Diastolic dysfunction on echo 6.   Left carotid artery bruit - check carotid arterial doppler    Current medicines are reviewed at length with the patient today.  The patient does not have concerns regarding medicines.  The following changes have been made:  no change  Labs/ tests ordered today include: carotid arterial dopplers  Orders Placed This Encounter  Procedures  . Carotid duplex     Disposition:   FU with me in 6 months   Signed, Tiffany Margarita, MD  08/19/2014 3:42 PM    [facility] [place], [place], [place]  [address] Phone: [phone]; Fax: [phone]

## 2014-08-19 ENCOUNTER — Ambulatory Visit (INDEPENDENT_AMBULATORY_CARE_PROVIDER_SITE_OTHER): Payer: 59 | Admitting: Cardiology

## 2014-08-19 ENCOUNTER — Encounter: Payer: Self-pay | Admitting: Cardiology

## 2014-08-19 VITALS — BP 136/78 | HR 51 | Ht 61.0 in | Wt 228.0 lb

## 2014-08-19 DIAGNOSIS — R0989 Other specified symptoms and signs involving the circulatory and respiratory systems: Secondary | ICD-10-CM

## 2014-08-19 DIAGNOSIS — I10 Essential (primary) hypertension: Secondary | ICD-10-CM | POA: Diagnosis not present

## 2014-08-19 DIAGNOSIS — Z6841 Body Mass Index (BMI) 40.0 and over, adult: Secondary | ICD-10-CM

## 2014-08-19 DIAGNOSIS — G4733 Obstructive sleep apnea (adult) (pediatric): Secondary | ICD-10-CM

## 2014-08-19 DIAGNOSIS — I270 Primary pulmonary hypertension: Secondary | ICD-10-CM | POA: Diagnosis not present

## 2014-08-19 DIAGNOSIS — I272 Pulmonary hypertension, unspecified: Secondary | ICD-10-CM

## 2014-08-19 HISTORY — DX: Other specified symptoms and signs involving the circulatory and respiratory systems: R09.89

## 2014-08-19 NOTE — Patient Instructions (Signed)
Your physician has requested that you have a carotid duplex. This test is an ultrasound of the carotid arteries in your neck. It looks at blood flow through these arteries that supply the brain with blood. Allow one hour for this exam. There are no restrictions or special instructions.  Your physician wants you to follow-up in: 6 months with Dr. Radford Pax. You will receive a reminder letter in the mail two months in advance. If you don't receive a letter, please call our office to schedule the follow-up appointment.

## 2014-08-27 ENCOUNTER — Ambulatory Visit (HOSPITAL_COMMUNITY): Payer: 59 | Attending: Cardiology | Admitting: *Deleted

## 2014-08-27 DIAGNOSIS — R0989 Other specified symptoms and signs involving the circulatory and respiratory systems: Secondary | ICD-10-CM

## 2014-08-27 NOTE — Progress Notes (Signed)
Carotid Duplex Scan Performed 

## 2014-08-31 ENCOUNTER — Telehealth: Payer: Self-pay | Admitting: Cardiology

## 2014-08-31 DIAGNOSIS — I6523 Occlusion and stenosis of bilateral carotid arteries: Secondary | ICD-10-CM

## 2014-08-31 NOTE — Telephone Encounter (Signed)
-----   Message from Sueanne Margarita, MD sent at 08/29/2014  6:14 PM EDT ----- 40-59% bilateral carotid artery stenosis - repeat study in 1 year

## 2014-08-31 NOTE — Telephone Encounter (Signed)
New Message    Patient is returning nurses call about some test that patient recently had please give patient a call back.    Thanks

## 2014-08-31 NOTE — Telephone Encounter (Signed)
Informed patient of results and verbal understanding expressed.  Repeat duplex ordered to be scheduled in 1 year. Patient agrees with treatment plan. 

## 2014-09-08 ENCOUNTER — Encounter: Payer: Self-pay | Admitting: Cardiology

## 2014-10-25 DIAGNOSIS — E1129 Type 2 diabetes mellitus with other diabetic kidney complication: Secondary | ICD-10-CM | POA: Diagnosis not present

## 2014-10-25 DIAGNOSIS — I129 Hypertensive chronic kidney disease with stage 1 through stage 4 chronic kidney disease, or unspecified chronic kidney disease: Secondary | ICD-10-CM | POA: Diagnosis not present

## 2014-10-25 DIAGNOSIS — N183 Chronic kidney disease, stage 3 (moderate): Secondary | ICD-10-CM | POA: Diagnosis not present

## 2014-12-01 DIAGNOSIS — H524 Presbyopia: Secondary | ICD-10-CM | POA: Diagnosis not present

## 2014-12-01 DIAGNOSIS — H2513 Age-related nuclear cataract, bilateral: Secondary | ICD-10-CM | POA: Diagnosis not present

## 2014-12-09 ENCOUNTER — Encounter: Payer: Self-pay | Admitting: Cardiology

## 2014-12-29 DIAGNOSIS — D509 Iron deficiency anemia, unspecified: Secondary | ICD-10-CM | POA: Diagnosis not present

## 2014-12-29 DIAGNOSIS — E780 Pure hypercholesterolemia: Secondary | ICD-10-CM | POA: Diagnosis not present

## 2014-12-29 DIAGNOSIS — I10 Essential (primary) hypertension: Secondary | ICD-10-CM | POA: Diagnosis not present

## 2014-12-29 DIAGNOSIS — Z96659 Presence of unspecified artificial knee joint: Secondary | ICD-10-CM | POA: Diagnosis not present

## 2014-12-29 DIAGNOSIS — E559 Vitamin D deficiency, unspecified: Secondary | ICD-10-CM | POA: Diagnosis not present

## 2014-12-29 DIAGNOSIS — Z79899 Other long term (current) drug therapy: Secondary | ICD-10-CM | POA: Diagnosis not present

## 2014-12-29 DIAGNOSIS — G4733 Obstructive sleep apnea (adult) (pediatric): Secondary | ICD-10-CM | POA: Diagnosis not present

## 2014-12-29 DIAGNOSIS — M25512 Pain in left shoulder: Secondary | ICD-10-CM | POA: Diagnosis not present

## 2014-12-29 DIAGNOSIS — N183 Chronic kidney disease, stage 3 (moderate): Secondary | ICD-10-CM | POA: Diagnosis not present

## 2014-12-29 DIAGNOSIS — E119 Type 2 diabetes mellitus without complications: Secondary | ICD-10-CM | POA: Diagnosis not present

## 2014-12-29 DIAGNOSIS — E1122 Type 2 diabetes mellitus with diabetic chronic kidney disease: Secondary | ICD-10-CM | POA: Diagnosis not present

## 2014-12-31 ENCOUNTER — Encounter: Payer: Self-pay | Admitting: Cardiology

## 2015-02-02 DIAGNOSIS — M25512 Pain in left shoulder: Secondary | ICD-10-CM | POA: Diagnosis not present

## 2015-02-03 ENCOUNTER — Other Ambulatory Visit: Payer: Self-pay | Admitting: Orthopedic Surgery

## 2015-02-03 DIAGNOSIS — R52 Pain, unspecified: Secondary | ICD-10-CM

## 2015-02-11 DIAGNOSIS — H04123 Dry eye syndrome of bilateral lacrimal glands: Secondary | ICD-10-CM | POA: Diagnosis not present

## 2015-02-11 DIAGNOSIS — H2513 Age-related nuclear cataract, bilateral: Secondary | ICD-10-CM | POA: Diagnosis not present

## 2015-02-11 DIAGNOSIS — E119 Type 2 diabetes mellitus without complications: Secondary | ICD-10-CM | POA: Diagnosis not present

## 2015-02-11 DIAGNOSIS — H10413 Chronic giant papillary conjunctivitis, bilateral: Secondary | ICD-10-CM | POA: Diagnosis not present

## 2015-02-13 ENCOUNTER — Ambulatory Visit
Admission: RE | Admit: 2015-02-13 | Discharge: 2015-02-13 | Disposition: A | Discharge status: Home / Self Care | Payer: 59 | Source: Ambulatory Visit | Attending: Orthopedic Surgery | Admitting: Orthopedic Surgery

## 2015-02-13 DIAGNOSIS — R52 Pain, unspecified: Secondary | ICD-10-CM

## 2015-02-13 DIAGNOSIS — S4382XA Sprain of other specified parts of left shoulder girdle, initial encounter: Secondary | ICD-10-CM | POA: Diagnosis not present

## 2015-02-15 DIAGNOSIS — M7542 Impingement syndrome of left shoulder: Secondary | ICD-10-CM | POA: Diagnosis not present

## 2015-02-15 DIAGNOSIS — M75102 Unspecified rotator cuff tear or rupture of left shoulder, not specified as traumatic: Secondary | ICD-10-CM | POA: Diagnosis not present

## 2015-03-04 DIAGNOSIS — G4733 Obstructive sleep apnea (adult) (pediatric): Secondary | ICD-10-CM | POA: Diagnosis not present

## 2015-03-04 DIAGNOSIS — E1122 Type 2 diabetes mellitus with diabetic chronic kidney disease: Secondary | ICD-10-CM | POA: Diagnosis not present

## 2015-03-04 DIAGNOSIS — D509 Iron deficiency anemia, unspecified: Secondary | ICD-10-CM | POA: Diagnosis not present

## 2015-03-04 DIAGNOSIS — E1129 Type 2 diabetes mellitus with other diabetic kidney complication: Secondary | ICD-10-CM | POA: Diagnosis not present

## 2015-03-04 DIAGNOSIS — E119 Type 2 diabetes mellitus without complications: Secondary | ICD-10-CM | POA: Diagnosis not present

## 2015-03-04 DIAGNOSIS — E780 Pure hypercholesterolemia: Secondary | ICD-10-CM | POA: Diagnosis not present

## 2015-03-04 DIAGNOSIS — Z0000 Encounter for general adult medical examination without abnormal findings: Secondary | ICD-10-CM | POA: Diagnosis not present

## 2015-03-04 DIAGNOSIS — N183 Chronic kidney disease, stage 3 (moderate): Secondary | ICD-10-CM | POA: Diagnosis not present

## 2015-03-04 DIAGNOSIS — I10 Essential (primary) hypertension: Secondary | ICD-10-CM | POA: Diagnosis not present

## 2015-03-04 DIAGNOSIS — Z1389 Encounter for screening for other disorder: Secondary | ICD-10-CM | POA: Diagnosis not present

## 2015-03-04 DIAGNOSIS — E559 Vitamin D deficiency, unspecified: Secondary | ICD-10-CM | POA: Diagnosis not present

## 2015-03-07 ENCOUNTER — Ambulatory Visit (INDEPENDENT_AMBULATORY_CARE_PROVIDER_SITE_OTHER): Payer: 59 | Admitting: Cardiology

## 2015-03-07 ENCOUNTER — Encounter: Payer: Self-pay | Admitting: Cardiology

## 2015-03-07 VITALS — BP 130/72 | HR 72 | Ht 61.5 in | Wt 225.2 lb

## 2015-03-07 DIAGNOSIS — I272 Other secondary pulmonary hypertension: Secondary | ICD-10-CM | POA: Diagnosis not present

## 2015-03-07 DIAGNOSIS — Z6841 Body Mass Index (BMI) 40.0 and over, adult: Secondary | ICD-10-CM

## 2015-03-07 DIAGNOSIS — I6529 Occlusion and stenosis of unspecified carotid artery: Secondary | ICD-10-CM | POA: Insufficient documentation

## 2015-03-07 DIAGNOSIS — G4733 Obstructive sleep apnea (adult) (pediatric): Secondary | ICD-10-CM

## 2015-03-07 DIAGNOSIS — I10 Essential (primary) hypertension: Secondary | ICD-10-CM

## 2015-03-07 DIAGNOSIS — I6523 Occlusion and stenosis of bilateral carotid arteries: Secondary | ICD-10-CM

## 2015-03-07 NOTE — Progress Notes (Signed)
Cardiology Office Note   Date:  03/07/2015   ID:  [name], DOB August 18, 1935, MRN [id number]  PCP:  Gerrit Heck, MD    Chief Complaint  Patient presents with  . Sleep Apnea  . Hypertension      History of Present Illness: [name] is a 79 y.o. female with a history of OSA, obesity, moderate pulmonary HTN, diastolic dysfunction and HTN who presents today for followup. She uses a nasal pillow mask and tolerates it well but has not been using her device much due to allergies and cough as well as shoulder pain.  She feels her pressure is adequate. She has no daytime sleepiness and feels rested in the am. She has no dryness of her mouth or nose. She denies any chest pain, SOB or palpitations. She occasionally will have some mild DOE with extreme exertion. Rarely she will have some mild LE edema of her ankles during the day.  She does a lot of walking at work without any problems.  She is going to have shoulder surgery soon.      Past Medical History  Diagnosis Date  . Anemia     takes iron  . GERD (gastroesophageal reflux disease)   . Arthritis     Dr Ronnie Derby  . Cataracts, bilateral   . Diabetes mellitus   . Wears dentures     upper  . Wears glasses   . Urination frequency   . Shortness of breath     resolved with CPAP  . Heart murmur     Dr Irish Lack  . Chronic kidney disease     stage III Dr Mercy Moore  . Obstructive sleep apnea (adult) (pediatric)     w CPAP Severe OSA AHI 52/hr CPAP at 10cm H2O  . HTN (hypertension)   . Morbid obesity with BMI of 40.0-44.9, adult ([place])   . Hyperlipidemia   . Vitamin D deficiency disease   . Pulmonary HTN ([place])     mild with PASP 34mmHg echo 02/2014  . Right carotid bruit 08/19/2014  . Carotid artery stenosis     40-60% bilateral 08/2014    Past Surgical History  Procedure Laterality Date  . Total knee arthroplasty      Left  . Abdominal hysterectomy      partial  . Appendectomy    .  US echocardiography      2012  . Sleep study  2012  . Cardiovascular stress test  2011  . Total knee arthroplasty  07/02/2011    Procedure: TOTAL KNEE ARTHROPLASTY;  Surgeon: Rudean Haskell, MD;  Location: [facility];  Service: Orthopedics;  Laterality: Right;  . Joint replacement      bilateral  . Colonoscopy  01/15/2012    Procedure: COLONOSCOPY;  Surgeon: Lear Ng, MD;  Location: [facility];  Service: Endoscopy;  Laterality: N/A;  . Esophagogastroduodenoscopy  01/15/2012    Procedure: ESOPHAGOGASTRODUODENOSCOPY (EGD);  Surgeon: Lear Ng, MD;  Location: [facility];  Service: Endoscopy;  Laterality: N/A;     Current Outpatient Prescriptions  Medication Sig Dispense Refill  . acetaminophen (TYLENOL) 500 MG tablet Take 500 mg by mouth every 6 (six) hours as needed for moderate pain.    Marland Kitchen amLODipine (NORVASC) 10 MG tablet Take 10 mg by mouth daily.  6  . aspirin 81 MG tablet Take 81 mg by mouth daily.    Marland Kitchen  atorvastatin (LIPITOR) 40 MG tablet Take 40 mg by mouth at bedtime.    . Cholecalciferol (VITAMIN D) 2000 UNITS CAPS Take 2,000 Units by mouth daily.    Marland Kitchen esomeprazole (NEXIUM) 40 MG capsule Take 40 mg by mouth daily at 12 noon.    . ezetimibe (ZETIA) 10 MG tablet Take 10 mg by mouth at bedtime.    . furosemide (LASIX) 40 MG tablet Take 40 mg by mouth daily.     Marland Kitchen HYDROcodone-acetaminophen (NORCO/VICODIN) 5-325 MG tablet TAKE 1-2 TABLETS EVERY 4-6HRS AS NEEDED FOR PAIN  0  . Multiple Vitamins-Minerals (MULTIVITAMINS THER. W/MINERALS) TABS Take 1 tablet by mouth daily.    . ONE TOUCH ULTRA TEST test strip     . pioglitazone (ACTOS) 30 MG tablet Take 30 mg by mouth daily.    . valsartan (DIOVAN) 320 MG tablet Take 320 mg by mouth daily.     No current facility-administered medications for this visit.    Allergies:   Codeine and Ibuprofen    Social History:  The patient  reports that she has never smoked. She does not have any smokeless tobacco history on file.  She reports that she does not drink alcohol or use illicit drugs.   Family History:  The patient's family history includes Diabetes in her father and mother.    ROS:  Please see the history of present illness.   Otherwise, review of systems are positive for shoulder pain   All other systems are reviewed and negative.    PHYSICAL EXAM: VS:  BP 130/72 mmHg  Pulse 72  Ht 5' 1.5" (1.562 m)  Wt 225 lb 3.2 oz (102.15 kg)  BMI 41.87 kg/m2  SpO2 98% , BMI Body mass index is 41.87 kg/(m^2). GEN: Well nourished, well developed, in no acute distress HEENT: normal Neck: no JVD, carotid bruits, or masses Cardiac: RRR; no murmurs, rubs, or gallops.  1+ edema Respiratory:  clear to auscultation bilaterally, normal work of breathing GI: soft, nontender, nondistended, + BS MS: no deformity or atrophy Skin: warm and dry, no rash Neuro:  Strength and sensation are intact Psych: euthymic mood, full affect   EKG:  EKG was ordered today and showed NSR at 63bpm with nonspecific ST abnormality    Recent Labs: No results found for requested labs within last 365 days.    Lipid Panel No results found for: CHOL, TRIG, HDL, CHOLHDL, VLDL, LDLCALC, LDLDIRECT    Wt Readings from Last 3 Encounters:  03/07/15 225 lb 3.2 oz (102.15 kg)  08/19/14 228 lb (103.42 kg)  06/06/14 232 lb (105.235 kg)    ASSESSMENT AND PLAN:  1.  OSA on CPAP- Her d/l showed an AHI of 0.7/hr on 10cm H2O but she is only compliant 24% of the time using more than 4 hours nightly.  She says that she has had a lot of problems with allergies and cough.  I have recommended that she try some OTC Claritin.   2.  HTN - well controlled - continue Amlodipine and Diovan 3.  Obesity - I have encouraged her to continue her exercise regimen. She was also counseled in a low fat diet and watch her portions 4.  Mild pulmonary HTN secondary to obesity, OSA and diastolic dysfunction - repeat echo 5.  Diastolic dysfunction on echo 6.  40-60%  carotid stenosis bilaterally - continue ASA.  Repeat dopplers 08/2015    Current medicines are reviewed at length with the patient today.  The patient does not have  concerns regarding medicines.  The following changes have been made:  no change  Labs/ tests ordered today: See above Assessment and Plan No orders of the defined types were placed in this encounter.     Disposition:   FU with me in 1 year  Signed, Sueanne Margarita, MD  03/07/2015 10:55 AM    [address], [place], [place]  [address] Phone: [phone]; Fax: [phone]

## 2015-03-07 NOTE — Patient Instructions (Signed)
Medication Instructions:  Your physician recommends that you continue on your current medications as directed. Please refer to the Current Medication list given to you today.   Labwork: None  Testing/Procedures: Your physician has requested that you have an echocardiogram. Echocardiography is a painless test that uses sound waves to create images of your heart. It provides your doctor with information about the size and shape of your heart and how well your heart's chambers and valves are working. This procedure takes approximately one hour. There are no restrictions for this procedure.   Your physician has requested that you have a carotid duplex in March, 2017. This test is an ultrasound of the carotid arteries in your neck. It looks at blood flow through these arteries that supply the brain with blood. Allow one hour for this exam. There are no restrictions or special instructions.  Follow-Up: Your physician wants you to follow-up in: 1 year with Dr. Radford Pax. You will receive a reminder letter in the mail two months in advance. If you don't receive a letter, please call our office to schedule the follow-up appointment.   Any Other Special Instructions Will Be Listed Below (If Applicable).

## 2015-03-08 ENCOUNTER — Telehealth: Payer: Self-pay | Admitting: Cardiology

## 2015-03-08 NOTE — Telephone Encounter (Signed)
Notes Recorded by Theodoro Parma, RN on 03/08/2015 at 11:53 AM Patient st she was not taking lipitor daily.  Based off this lab work, her PCP instructed her to take the medication daily and they will repeat the lab work in 3 months. Notes Recorded by Theodoro Parma, RN on 03/07/2015 at 5:20 PM Left message to call back. Notes Recorded by Sueanne Margarita, MD on 03/07/2015 at 3:49 PM LDL 167 and goal < 70 with carotid artery disease - please find out how long she has been on lipitor 40mg  daily

## 2015-03-08 NOTE — Telephone Encounter (Signed)
Left message to call back  

## 2015-03-08 NOTE — Telephone Encounter (Signed)
New Message  Pt states that she is returning RN call about lab work  Please leave a message

## 2015-03-14 ENCOUNTER — Ambulatory Visit (HOSPITAL_COMMUNITY): Payer: 59 | Attending: Internal Medicine

## 2015-03-14 ENCOUNTER — Other Ambulatory Visit: Payer: Self-pay

## 2015-03-14 DIAGNOSIS — I129 Hypertensive chronic kidney disease with stage 1 through stage 4 chronic kidney disease, or unspecified chronic kidney disease: Secondary | ICD-10-CM | POA: Diagnosis not present

## 2015-03-14 DIAGNOSIS — N189 Chronic kidney disease, unspecified: Secondary | ICD-10-CM | POA: Diagnosis not present

## 2015-03-14 DIAGNOSIS — I272 Other secondary pulmonary hypertension: Secondary | ICD-10-CM | POA: Insufficient documentation

## 2015-03-14 DIAGNOSIS — Z6841 Body Mass Index (BMI) 40.0 and over, adult: Secondary | ICD-10-CM | POA: Insufficient documentation

## 2015-03-14 DIAGNOSIS — I340 Nonrheumatic mitral (valve) insufficiency: Secondary | ICD-10-CM | POA: Insufficient documentation

## 2015-03-14 DIAGNOSIS — E119 Type 2 diabetes mellitus without complications: Secondary | ICD-10-CM | POA: Insufficient documentation

## 2015-03-14 DIAGNOSIS — E785 Hyperlipidemia, unspecified: Secondary | ICD-10-CM | POA: Diagnosis not present

## 2015-03-14 DIAGNOSIS — I517 Cardiomegaly: Secondary | ICD-10-CM | POA: Diagnosis not present

## 2015-03-16 ENCOUNTER — Telehealth: Payer: Self-pay

## 2015-03-16 DIAGNOSIS — M7542 Impingement syndrome of left shoulder: Secondary | ICD-10-CM | POA: Diagnosis not present

## 2015-03-16 DIAGNOSIS — M94212 Chondromalacia, left shoulder: Secondary | ICD-10-CM | POA: Diagnosis not present

## 2015-03-16 DIAGNOSIS — S43422A Sprain of left rotator cuff capsule, initial encounter: Secondary | ICD-10-CM | POA: Diagnosis not present

## 2015-03-16 DIAGNOSIS — I272 Pulmonary hypertension, unspecified: Secondary | ICD-10-CM

## 2015-03-16 DIAGNOSIS — M75122 Complete rotator cuff tear or rupture of left shoulder, not specified as traumatic: Secondary | ICD-10-CM | POA: Diagnosis not present

## 2015-03-16 DIAGNOSIS — M24112 Other articular cartilage disorders, left shoulder: Secondary | ICD-10-CM | POA: Diagnosis not present

## 2015-03-16 DIAGNOSIS — M19012 Primary osteoarthritis, left shoulder: Secondary | ICD-10-CM | POA: Diagnosis not present

## 2015-03-16 DIAGNOSIS — G8918 Other acute postprocedural pain: Secondary | ICD-10-CM | POA: Diagnosis not present

## 2015-03-16 NOTE — Telephone Encounter (Signed)
-----   Message from Sueanne Margarita, MD sent at 03/14/2015  7:47 PM EDT ----- PASP only milldy increased from prior study - repeat in 1 year

## 2015-03-16 NOTE — Telephone Encounter (Signed)
Informed patient of results and verbal understanding expressed.   Repeat ECHO ordered to be scheduled in one year. Patient agrees with treatment plan. 

## 2015-03-22 DIAGNOSIS — L299 Pruritus, unspecified: Secondary | ICD-10-CM | POA: Diagnosis not present

## 2015-03-24 DIAGNOSIS — M6281 Muscle weakness (generalized): Secondary | ICD-10-CM | POA: Diagnosis not present

## 2015-03-24 DIAGNOSIS — Z9889 Other specified postprocedural states: Secondary | ICD-10-CM | POA: Diagnosis not present

## 2015-03-24 DIAGNOSIS — M25612 Stiffness of left shoulder, not elsewhere classified: Secondary | ICD-10-CM | POA: Diagnosis not present

## 2015-03-24 DIAGNOSIS — M75102 Unspecified rotator cuff tear or rupture of left shoulder, not specified as traumatic: Secondary | ICD-10-CM | POA: Diagnosis not present

## 2015-03-28 DIAGNOSIS — M6281 Muscle weakness (generalized): Secondary | ICD-10-CM | POA: Diagnosis not present

## 2015-03-28 DIAGNOSIS — M25612 Stiffness of left shoulder, not elsewhere classified: Secondary | ICD-10-CM | POA: Diagnosis not present

## 2015-03-28 DIAGNOSIS — Z9889 Other specified postprocedural states: Secondary | ICD-10-CM | POA: Diagnosis not present

## 2015-03-28 DIAGNOSIS — M75102 Unspecified rotator cuff tear or rupture of left shoulder, not specified as traumatic: Secondary | ICD-10-CM | POA: Diagnosis not present

## 2015-03-30 DIAGNOSIS — Z9889 Other specified postprocedural states: Secondary | ICD-10-CM | POA: Diagnosis not present

## 2015-03-30 DIAGNOSIS — M25612 Stiffness of left shoulder, not elsewhere classified: Secondary | ICD-10-CM | POA: Diagnosis not present

## 2015-03-30 DIAGNOSIS — M75102 Unspecified rotator cuff tear or rupture of left shoulder, not specified as traumatic: Secondary | ICD-10-CM | POA: Diagnosis not present

## 2015-03-30 DIAGNOSIS — M6281 Muscle weakness (generalized): Secondary | ICD-10-CM | POA: Diagnosis not present

## 2015-04-05 ENCOUNTER — Encounter: Payer: Self-pay | Admitting: Cardiology

## 2015-06-08 DIAGNOSIS — M75102 Unspecified rotator cuff tear or rupture of left shoulder, not specified as traumatic: Secondary | ICD-10-CM | POA: Diagnosis not present

## 2015-06-08 DIAGNOSIS — M6281 Muscle weakness (generalized): Secondary | ICD-10-CM | POA: Diagnosis not present

## 2015-06-08 DIAGNOSIS — M25612 Stiffness of left shoulder, not elsewhere classified: Secondary | ICD-10-CM | POA: Diagnosis not present

## 2015-06-08 DIAGNOSIS — Z9889 Other specified postprocedural states: Secondary | ICD-10-CM | POA: Diagnosis not present

## 2015-06-10 DIAGNOSIS — M75102 Unspecified rotator cuff tear or rupture of left shoulder, not specified as traumatic: Secondary | ICD-10-CM | POA: Diagnosis not present

## 2015-06-10 DIAGNOSIS — Z9889 Other specified postprocedural states: Secondary | ICD-10-CM | POA: Diagnosis not present

## 2015-06-10 DIAGNOSIS — M25612 Stiffness of left shoulder, not elsewhere classified: Secondary | ICD-10-CM | POA: Diagnosis not present

## 2015-06-10 DIAGNOSIS — M6281 Muscle weakness (generalized): Secondary | ICD-10-CM | POA: Diagnosis not present

## 2015-06-14 DIAGNOSIS — I129 Hypertensive chronic kidney disease with stage 1 through stage 4 chronic kidney disease, or unspecified chronic kidney disease: Secondary | ICD-10-CM | POA: Diagnosis not present

## 2015-06-14 DIAGNOSIS — N183 Chronic kidney disease, stage 3 (moderate): Secondary | ICD-10-CM | POA: Diagnosis not present

## 2015-06-14 DIAGNOSIS — E1129 Type 2 diabetes mellitus with other diabetic kidney complication: Secondary | ICD-10-CM | POA: Diagnosis not present

## 2015-06-16 DIAGNOSIS — Z9889 Other specified postprocedural states: Secondary | ICD-10-CM | POA: Diagnosis not present

## 2015-06-22 DIAGNOSIS — M75102 Unspecified rotator cuff tear or rupture of left shoulder, not specified as traumatic: Secondary | ICD-10-CM | POA: Diagnosis not present

## 2015-06-22 DIAGNOSIS — M25612 Stiffness of left shoulder, not elsewhere classified: Secondary | ICD-10-CM | POA: Diagnosis not present

## 2015-06-22 DIAGNOSIS — M6281 Muscle weakness (generalized): Secondary | ICD-10-CM | POA: Diagnosis not present

## 2015-06-22 DIAGNOSIS — Z9889 Other specified postprocedural states: Secondary | ICD-10-CM | POA: Diagnosis not present

## 2015-06-23 DIAGNOSIS — H04123 Dry eye syndrome of bilateral lacrimal glands: Secondary | ICD-10-CM | POA: Diagnosis not present

## 2015-06-23 DIAGNOSIS — H524 Presbyopia: Secondary | ICD-10-CM | POA: Diagnosis not present

## 2015-06-24 DIAGNOSIS — M25612 Stiffness of left shoulder, not elsewhere classified: Secondary | ICD-10-CM | POA: Diagnosis not present

## 2015-06-24 DIAGNOSIS — M6281 Muscle weakness (generalized): Secondary | ICD-10-CM | POA: Diagnosis not present

## 2015-06-24 DIAGNOSIS — Z9889 Other specified postprocedural states: Secondary | ICD-10-CM | POA: Diagnosis not present

## 2015-06-24 DIAGNOSIS — M75102 Unspecified rotator cuff tear or rupture of left shoulder, not specified as traumatic: Secondary | ICD-10-CM | POA: Diagnosis not present

## 2015-06-27 DIAGNOSIS — Z9889 Other specified postprocedural states: Secondary | ICD-10-CM | POA: Diagnosis not present

## 2015-06-27 DIAGNOSIS — M6281 Muscle weakness (generalized): Secondary | ICD-10-CM | POA: Diagnosis not present

## 2015-06-27 DIAGNOSIS — M75102 Unspecified rotator cuff tear or rupture of left shoulder, not specified as traumatic: Secondary | ICD-10-CM | POA: Diagnosis not present

## 2015-06-27 DIAGNOSIS — M25612 Stiffness of left shoulder, not elsewhere classified: Secondary | ICD-10-CM | POA: Diagnosis not present

## 2015-06-29 DIAGNOSIS — M75102 Unspecified rotator cuff tear or rupture of left shoulder, not specified as traumatic: Secondary | ICD-10-CM | POA: Diagnosis not present

## 2015-06-29 DIAGNOSIS — M25612 Stiffness of left shoulder, not elsewhere classified: Secondary | ICD-10-CM | POA: Diagnosis not present

## 2015-06-29 DIAGNOSIS — Z9889 Other specified postprocedural states: Secondary | ICD-10-CM | POA: Diagnosis not present

## 2015-06-29 DIAGNOSIS — M6281 Muscle weakness (generalized): Secondary | ICD-10-CM | POA: Diagnosis not present

## 2015-07-04 DIAGNOSIS — M6281 Muscle weakness (generalized): Secondary | ICD-10-CM | POA: Diagnosis not present

## 2015-07-04 DIAGNOSIS — Z9889 Other specified postprocedural states: Secondary | ICD-10-CM | POA: Diagnosis not present

## 2015-07-04 DIAGNOSIS — M25612 Stiffness of left shoulder, not elsewhere classified: Secondary | ICD-10-CM | POA: Diagnosis not present

## 2015-07-04 DIAGNOSIS — M75102 Unspecified rotator cuff tear or rupture of left shoulder, not specified as traumatic: Secondary | ICD-10-CM | POA: Diagnosis not present

## 2015-07-06 DIAGNOSIS — M75102 Unspecified rotator cuff tear or rupture of left shoulder, not specified as traumatic: Secondary | ICD-10-CM | POA: Diagnosis not present

## 2015-07-06 DIAGNOSIS — M25612 Stiffness of left shoulder, not elsewhere classified: Secondary | ICD-10-CM | POA: Diagnosis not present

## 2015-07-06 DIAGNOSIS — M6281 Muscle weakness (generalized): Secondary | ICD-10-CM | POA: Diagnosis not present

## 2015-07-06 DIAGNOSIS — Z9889 Other specified postprocedural states: Secondary | ICD-10-CM | POA: Diagnosis not present

## 2015-07-11 DIAGNOSIS — M6281 Muscle weakness (generalized): Secondary | ICD-10-CM | POA: Diagnosis not present

## 2015-07-11 DIAGNOSIS — M75102 Unspecified rotator cuff tear or rupture of left shoulder, not specified as traumatic: Secondary | ICD-10-CM | POA: Diagnosis not present

## 2015-07-11 DIAGNOSIS — Z9889 Other specified postprocedural states: Secondary | ICD-10-CM | POA: Diagnosis not present

## 2015-07-11 DIAGNOSIS — M25612 Stiffness of left shoulder, not elsewhere classified: Secondary | ICD-10-CM | POA: Diagnosis not present

## 2015-07-13 DIAGNOSIS — M25612 Stiffness of left shoulder, not elsewhere classified: Secondary | ICD-10-CM | POA: Diagnosis not present

## 2015-07-13 DIAGNOSIS — M6281 Muscle weakness (generalized): Secondary | ICD-10-CM | POA: Diagnosis not present

## 2015-07-13 DIAGNOSIS — M75102 Unspecified rotator cuff tear or rupture of left shoulder, not specified as traumatic: Secondary | ICD-10-CM | POA: Diagnosis not present

## 2015-07-13 DIAGNOSIS — Z9889 Other specified postprocedural states: Secondary | ICD-10-CM | POA: Diagnosis not present

## 2015-07-19 DIAGNOSIS — Z9889 Other specified postprocedural states: Secondary | ICD-10-CM | POA: Diagnosis not present

## 2015-07-25 DIAGNOSIS — M75102 Unspecified rotator cuff tear or rupture of left shoulder, not specified as traumatic: Secondary | ICD-10-CM | POA: Diagnosis not present

## 2015-07-25 DIAGNOSIS — M25612 Stiffness of left shoulder, not elsewhere classified: Secondary | ICD-10-CM | POA: Diagnosis not present

## 2015-07-25 DIAGNOSIS — M6281 Muscle weakness (generalized): Secondary | ICD-10-CM | POA: Diagnosis not present

## 2015-07-25 DIAGNOSIS — Z9889 Other specified postprocedural states: Secondary | ICD-10-CM | POA: Diagnosis not present

## 2015-08-01 DIAGNOSIS — R319 Hematuria, unspecified: Secondary | ICD-10-CM | POA: Diagnosis not present

## 2015-08-04 DIAGNOSIS — Z9889 Other specified postprocedural states: Secondary | ICD-10-CM | POA: Diagnosis not present

## 2015-08-04 DIAGNOSIS — M75102 Unspecified rotator cuff tear or rupture of left shoulder, not specified as traumatic: Secondary | ICD-10-CM | POA: Diagnosis not present

## 2015-08-04 DIAGNOSIS — R002 Palpitations: Secondary | ICD-10-CM | POA: Diagnosis not present

## 2015-08-04 DIAGNOSIS — R319 Hematuria, unspecified: Secondary | ICD-10-CM | POA: Diagnosis not present

## 2015-08-04 DIAGNOSIS — R1031 Right lower quadrant pain: Secondary | ICD-10-CM | POA: Diagnosis not present

## 2015-08-04 DIAGNOSIS — E1129 Type 2 diabetes mellitus with other diabetic kidney complication: Secondary | ICD-10-CM | POA: Diagnosis not present

## 2015-08-04 DIAGNOSIS — M25612 Stiffness of left shoulder, not elsewhere classified: Secondary | ICD-10-CM | POA: Diagnosis not present

## 2015-08-04 DIAGNOSIS — M6281 Muscle weakness (generalized): Secondary | ICD-10-CM | POA: Diagnosis not present

## 2015-08-04 DIAGNOSIS — E7800 Pure hypercholesterolemia, unspecified: Secondary | ICD-10-CM | POA: Diagnosis not present

## 2015-08-08 DIAGNOSIS — M6281 Muscle weakness (generalized): Secondary | ICD-10-CM | POA: Diagnosis not present

## 2015-08-08 DIAGNOSIS — Z9889 Other specified postprocedural states: Secondary | ICD-10-CM | POA: Diagnosis not present

## 2015-08-08 DIAGNOSIS — M75102 Unspecified rotator cuff tear or rupture of left shoulder, not specified as traumatic: Secondary | ICD-10-CM | POA: Diagnosis not present

## 2015-08-08 DIAGNOSIS — M25612 Stiffness of left shoulder, not elsewhere classified: Secondary | ICD-10-CM | POA: Diagnosis not present

## 2015-08-15 DIAGNOSIS — M25612 Stiffness of left shoulder, not elsewhere classified: Secondary | ICD-10-CM | POA: Diagnosis not present

## 2015-08-15 DIAGNOSIS — Z9889 Other specified postprocedural states: Secondary | ICD-10-CM | POA: Diagnosis not present

## 2015-08-15 DIAGNOSIS — M6281 Muscle weakness (generalized): Secondary | ICD-10-CM | POA: Diagnosis not present

## 2015-08-15 DIAGNOSIS — M75102 Unspecified rotator cuff tear or rupture of left shoulder, not specified as traumatic: Secondary | ICD-10-CM | POA: Diagnosis not present

## 2015-08-22 DIAGNOSIS — M25612 Stiffness of left shoulder, not elsewhere classified: Secondary | ICD-10-CM | POA: Diagnosis not present

## 2015-08-22 DIAGNOSIS — Z9889 Other specified postprocedural states: Secondary | ICD-10-CM | POA: Diagnosis not present

## 2015-08-22 DIAGNOSIS — M75102 Unspecified rotator cuff tear or rupture of left shoulder, not specified as traumatic: Secondary | ICD-10-CM | POA: Diagnosis not present

## 2015-08-22 DIAGNOSIS — M6281 Muscle weakness (generalized): Secondary | ICD-10-CM | POA: Diagnosis not present

## 2015-08-30 DIAGNOSIS — Z9889 Other specified postprocedural states: Secondary | ICD-10-CM | POA: Diagnosis not present

## 2015-08-30 DIAGNOSIS — M6281 Muscle weakness (generalized): Secondary | ICD-10-CM | POA: Diagnosis not present

## 2015-08-30 DIAGNOSIS — M25612 Stiffness of left shoulder, not elsewhere classified: Secondary | ICD-10-CM | POA: Diagnosis not present

## 2015-08-30 DIAGNOSIS — M75102 Unspecified rotator cuff tear or rupture of left shoulder, not specified as traumatic: Secondary | ICD-10-CM | POA: Diagnosis not present

## 2015-09-02 ENCOUNTER — Ambulatory Visit (HOSPITAL_COMMUNITY)
Admission: RE | Admit: 2015-09-02 | Discharge: 2015-09-02 | Disposition: A | Discharge status: Home / Self Care | Payer: 59 | Source: Ambulatory Visit | Attending: Cardiology | Admitting: Cardiology

## 2015-09-02 DIAGNOSIS — E785 Hyperlipidemia, unspecified: Secondary | ICD-10-CM | POA: Insufficient documentation

## 2015-09-02 DIAGNOSIS — K219 Gastro-esophageal reflux disease without esophagitis: Secondary | ICD-10-CM | POA: Diagnosis not present

## 2015-09-02 DIAGNOSIS — I6523 Occlusion and stenosis of bilateral carotid arteries: Secondary | ICD-10-CM | POA: Diagnosis not present

## 2015-09-02 DIAGNOSIS — E1122 Type 2 diabetes mellitus with diabetic chronic kidney disease: Secondary | ICD-10-CM | POA: Diagnosis not present

## 2015-09-02 DIAGNOSIS — I129 Hypertensive chronic kidney disease with stage 1 through stage 4 chronic kidney disease, or unspecified chronic kidney disease: Secondary | ICD-10-CM | POA: Insufficient documentation

## 2015-09-02 DIAGNOSIS — G4733 Obstructive sleep apnea (adult) (pediatric): Secondary | ICD-10-CM | POA: Diagnosis not present

## 2015-09-02 DIAGNOSIS — N183 Chronic kidney disease, stage 3 (moderate): Secondary | ICD-10-CM | POA: Insufficient documentation

## 2015-09-03 ENCOUNTER — Encounter: Payer: Self-pay | Admitting: Cardiology

## 2015-09-05 ENCOUNTER — Telehealth: Payer: Self-pay | Admitting: Cardiology

## 2015-09-05 DIAGNOSIS — M25612 Stiffness of left shoulder, not elsewhere classified: Secondary | ICD-10-CM | POA: Diagnosis not present

## 2015-09-05 DIAGNOSIS — Z9889 Other specified postprocedural states: Secondary | ICD-10-CM | POA: Diagnosis not present

## 2015-09-05 DIAGNOSIS — R29898 Other symptoms and signs involving the musculoskeletal system: Secondary | ICD-10-CM | POA: Diagnosis not present

## 2015-09-05 DIAGNOSIS — M75102 Unspecified rotator cuff tear or rupture of left shoulder, not specified as traumatic: Secondary | ICD-10-CM | POA: Diagnosis not present

## 2015-09-05 NOTE — Telephone Encounter (Signed)
F/u  Pt returning RN phone call- test results; can leave detailed vm. Please call back and discuss.

## 2015-09-05 NOTE — Telephone Encounter (Signed)
Per patient request, left detailed VM of results and Dr. Theodosia Blender recommendations. Informed her repeat dopplers will be done in 2 years. Instructed her to call if she has any questions or concerns.

## 2015-09-05 NOTE — Telephone Encounter (Signed)
-----   Message from Sueanne Margarita, MD sent at 09/03/2015  9:01 PM EDT ----- 1-39% carotid artery stenosis bilaterally - repeat dopplers in 2 years

## 2015-09-06 ENCOUNTER — Telehealth: Payer: Self-pay | Admitting: Cardiology

## 2015-09-06 DIAGNOSIS — Z4889 Encounter for other specified surgical aftercare: Secondary | ICD-10-CM | POA: Diagnosis not present

## 2015-09-06 NOTE — Telephone Encounter (Signed)
PT AWARE OF CAROTID RESULTS ./CY 

## 2015-09-06 NOTE — Telephone Encounter (Signed)
New Message:  Pt is calling back to speak with Valetta Fuller about some test results. Please f/u with her

## 2015-09-07 ENCOUNTER — Other Ambulatory Visit: Payer: Self-pay | Admitting: Orthopedic Surgery

## 2015-09-07 DIAGNOSIS — R531 Weakness: Secondary | ICD-10-CM

## 2015-09-07 DIAGNOSIS — R52 Pain, unspecified: Secondary | ICD-10-CM

## 2015-09-11 ENCOUNTER — Ambulatory Visit
Admission: RE | Admit: 2015-09-11 | Discharge: 2015-09-11 | Disposition: A | Discharge status: Home / Self Care | Payer: 59 | Source: Ambulatory Visit | Attending: Orthopedic Surgery | Admitting: Orthopedic Surgery

## 2015-09-11 DIAGNOSIS — M75112 Incomplete rotator cuff tear or rupture of left shoulder, not specified as traumatic: Secondary | ICD-10-CM | POA: Diagnosis not present

## 2015-09-11 DIAGNOSIS — R531 Weakness: Secondary | ICD-10-CM

## 2015-09-11 DIAGNOSIS — R52 Pain, unspecified: Secondary | ICD-10-CM

## 2015-09-13 DIAGNOSIS — M7540 Impingement syndrome of unspecified shoulder: Secondary | ICD-10-CM | POA: Diagnosis not present

## 2015-09-14 ENCOUNTER — Encounter (HOSPITAL_COMMUNITY): Payer: Self-pay | Admitting: Emergency Medicine

## 2015-09-14 ENCOUNTER — Emergency Department (HOSPITAL_COMMUNITY)
Admission: EM | Admit: 2015-09-14 | Discharge: 2015-09-14 | Disposition: A | Discharge status: Home / Self Care | Payer: 59 | Attending: Emergency Medicine | Admitting: Emergency Medicine

## 2015-09-14 ENCOUNTER — Emergency Department (HOSPITAL_COMMUNITY): Payer: 59

## 2015-09-14 DIAGNOSIS — Z9049 Acquired absence of other specified parts of digestive tract: Secondary | ICD-10-CM | POA: Diagnosis not present

## 2015-09-14 DIAGNOSIS — G4733 Obstructive sleep apnea (adult) (pediatric): Secondary | ICD-10-CM | POA: Diagnosis not present

## 2015-09-14 DIAGNOSIS — Z90710 Acquired absence of both cervix and uterus: Secondary | ICD-10-CM | POA: Diagnosis not present

## 2015-09-14 DIAGNOSIS — R1011 Right upper quadrant pain: Secondary | ICD-10-CM | POA: Diagnosis present

## 2015-09-14 DIAGNOSIS — E119 Type 2 diabetes mellitus without complications: Secondary | ICD-10-CM | POA: Insufficient documentation

## 2015-09-14 DIAGNOSIS — I129 Hypertensive chronic kidney disease with stage 1 through stage 4 chronic kidney disease, or unspecified chronic kidney disease: Secondary | ICD-10-CM | POA: Diagnosis not present

## 2015-09-14 DIAGNOSIS — Z79899 Other long term (current) drug therapy: Secondary | ICD-10-CM | POA: Insufficient documentation

## 2015-09-14 DIAGNOSIS — R1013 Epigastric pain: Secondary | ICD-10-CM | POA: Diagnosis not present

## 2015-09-14 DIAGNOSIS — R110 Nausea: Secondary | ICD-10-CM | POA: Diagnosis not present

## 2015-09-14 DIAGNOSIS — K219 Gastro-esophageal reflux disease without esophagitis: Secondary | ICD-10-CM | POA: Insufficient documentation

## 2015-09-14 DIAGNOSIS — Z973 Presence of spectacles and contact lenses: Secondary | ICD-10-CM | POA: Diagnosis not present

## 2015-09-14 DIAGNOSIS — D649 Anemia, unspecified: Secondary | ICD-10-CM | POA: Diagnosis not present

## 2015-09-14 DIAGNOSIS — Z7982 Long term (current) use of aspirin: Secondary | ICD-10-CM | POA: Diagnosis not present

## 2015-09-14 DIAGNOSIS — M1990 Unspecified osteoarthritis, unspecified site: Secondary | ICD-10-CM | POA: Insufficient documentation

## 2015-09-14 DIAGNOSIS — R011 Cardiac murmur, unspecified: Secondary | ICD-10-CM | POA: Insufficient documentation

## 2015-09-14 DIAGNOSIS — K8050 Calculus of bile duct without cholangitis or cholecystitis without obstruction: Secondary | ICD-10-CM | POA: Diagnosis not present

## 2015-09-14 DIAGNOSIS — E785 Hyperlipidemia, unspecified: Secondary | ICD-10-CM | POA: Diagnosis not present

## 2015-09-14 DIAGNOSIS — Z7984 Long term (current) use of oral hypoglycemic drugs: Secondary | ICD-10-CM | POA: Insufficient documentation

## 2015-09-14 DIAGNOSIS — N183 Chronic kidney disease, stage 3 (moderate): Secondary | ICD-10-CM | POA: Insufficient documentation

## 2015-09-14 DIAGNOSIS — E559 Vitamin D deficiency, unspecified: Secondary | ICD-10-CM | POA: Insufficient documentation

## 2015-09-14 DIAGNOSIS — Z9981 Dependence on supplemental oxygen: Secondary | ICD-10-CM | POA: Diagnosis not present

## 2015-09-14 DIAGNOSIS — K8020 Calculus of gallbladder without cholecystitis without obstruction: Secondary | ICD-10-CM | POA: Diagnosis not present

## 2015-09-14 LAB — CBC
HEMATOCRIT: 32 % — AB (ref 36.0–46.0)
HEMOGLOBIN: 10.7 g/dL — AB (ref 12.0–15.0)
MCH: 28.5 pg (ref 26.0–34.0)
MCHC: 33.4 g/dL (ref 30.0–36.0)
MCV: 85.3 fL (ref 78.0–100.0)
Platelets: 260 10*3/uL (ref 150–400)
RBC: 3.75 MIL/uL — AB (ref 3.87–5.11)
RDW: 15.4 % (ref 11.5–15.5)
WBC: 6.7 10*3/uL (ref 4.0–10.5)

## 2015-09-14 LAB — COMPREHENSIVE METABOLIC PANEL
ALBUMIN: 3.9 g/dL (ref 3.5–5.0)
ALT: 13 U/L — ABNORMAL LOW (ref 14–54)
ANION GAP: 13 (ref 5–15)
AST: 18 U/L (ref 15–41)
Alkaline Phosphatase: 80 U/L (ref 38–126)
BILIRUBIN TOTAL: 0.5 mg/dL (ref 0.3–1.2)
BUN: 21 mg/dL — ABNORMAL HIGH (ref 6–20)
CO2: 20 mmol/L — AB (ref 22–32)
Calcium: 9.4 mg/dL (ref 8.9–10.3)
Chloride: 108 mmol/L (ref 101–111)
Creatinine, Ser: 1.32 mg/dL — ABNORMAL HIGH (ref 0.44–1.00)
GFR calc Af Amer: 44 mL/min — ABNORMAL LOW (ref >60)
GFR calc non Af Amer: 38 mL/min — ABNORMAL LOW (ref >60)
GLUCOSE: 172 mg/dL — AB (ref 65–99)
POTASSIUM: 3.9 mmol/L (ref 3.5–5.1)
SODIUM: 141 mmol/L (ref 135–145)
TOTAL PROTEIN: 7.3 g/dL (ref 6.5–8.1)

## 2015-09-14 LAB — URINALYSIS, ROUTINE W REFLEX MICROSCOPIC
BILIRUBIN URINE: NEGATIVE
Glucose, UA: NEGATIVE mg/dL
Ketones, ur: NEGATIVE mg/dL
Leukocytes, UA: NEGATIVE
NITRITE: NEGATIVE
PH: 6 (ref 5.0–8.0)
Protein, ur: NEGATIVE mg/dL
SPECIFIC GRAVITY, URINE: 1.009 (ref 1.005–1.030)

## 2015-09-14 LAB — URINE MICROSCOPIC-ADD ON

## 2015-09-14 LAB — I-STAT TROPONIN, ED: TROPONIN I, POC: 0.01 ng/mL (ref 0.00–0.08)

## 2015-09-14 LAB — LIPASE, BLOOD: Lipase: 19 U/L (ref 11–51)

## 2015-09-14 MED ORDER — SODIUM CHLORIDE 0.9 % IV BOLUS (SEPSIS)
500.0000 mL | Freq: Once | INTRAVENOUS | Status: AC
  Administered 2015-09-14: 500 mL via INTRAVENOUS

## 2015-09-14 MED ORDER — HYDROCODONE-ACETAMINOPHEN 5-325 MG PO TABS: 1.0000 | ORAL_TABLET | Freq: Three times a day (TID) | ORAL | Status: DC | PRN

## 2015-09-14 MED ORDER — ONDANSETRON HCL 4 MG PO TABS: 4.0000 mg | ORAL_TABLET | Freq: Four times a day (QID) | ORAL | Status: DC

## 2015-09-14 NOTE — ED Notes (Signed)
Woke up with RUQ abd pain, nausea, and dry heaves. Saw PCP who gave her a "shot of toradol and some nausea medication, and that they're worried about her gallbladder" and sent them here. Denies fever, diarrhea.

## 2015-09-14 NOTE — ED Provider Notes (Signed)
CSN: [id number]     Arrival date & time 09/14/15  0957 History   First MD Initiated Contact with Patient 09/14/15 1049     Chief Complaint  Patient presents with  . Abdominal Pain  . Nausea     Patient is a 80 y.o. female presenting with abdominal pain. The history is provided by the patient. No language interpreter was used.  Abdominal Pain  [name] is a 80 y.o. female who presents to the Emergency Department complaining of abdominal pain.  She reports diffuse upper abdominal pain, greatest over the right upper quadrant that started about 5 AM today. The pain is nagging in nature and waxing and waning. She saw her PCP this morning and received a shot of Toradol that significantly improved her pain and she was referred to the emergency department for further evaluation. She denies any fevers, chest pain, shortness of breath, diarrhea, dysuria. She does have nausea and dry heaves. She's had a prior hysterectomy and appendectomy.  Past Medical History  Diagnosis Date  . Anemia     takes iron  . GERD (gastroesophageal reflux disease)   . Arthritis     Dr Ronnie Derby  . Cataracts, bilateral   . Diabetes mellitus   . Wears dentures     upper  . Wears glasses   . Urination frequency   . Shortness of breath     resolved with CPAP  . Heart murmur     Dr Irish Lack  . Chronic kidney disease     stage III Dr Mercy Moore  . Obstructive sleep apnea (adult) (pediatric)     w CPAP Severe OSA AHI 52/hr CPAP at 10cm H2O  . HTN (hypertension)   . Morbid obesity with BMI of 40.0-44.9, adult ([place])   . Hyperlipidemia   . Vitamin D deficiency disease   . Pulmonary HTN ([place])     mild with PASP 21mmHg echo 02/2014  . Right carotid bruit 08/19/2014  . Carotid artery stenosis     1-39% bilateral dopplers 08/2015   Past Surgical History  Procedure Laterality Date  . Total knee arthroplasty      Left  . Abdominal hysterectomy      partial  . Appendectomy    . US echocardiography      2012  .  Sleep study  2012  . Cardiovascular stress test  2011  . Total knee arthroplasty  07/02/2011    Procedure: TOTAL KNEE ARTHROPLASTY;  Surgeon: Rudean Haskell, MD;  Location: [facility];  Service: Orthopedics;  Laterality: Right;  . Joint replacement      bilateral  . Colonoscopy  01/15/2012    Procedure: COLONOSCOPY;  Surgeon: Lear Ng, MD;  Location: [facility];  Service: Endoscopy;  Laterality: N/A;  . Esophagogastroduodenoscopy  01/15/2012    Procedure: ESOPHAGOGASTRODUODENOSCOPY (EGD);  Surgeon: Lear Ng, MD;  Location: [facility];  Service: Endoscopy;  Laterality: N/A;   Family History  Problem Relation Age of Onset  . Diabetes Mother   . Diabetes Father    Social History  Substance Use Topics  . Smoking status: Never Smoker   . Smokeless tobacco: None  . Alcohol Use: No   OB History    No data available     Review of Systems  Gastrointestinal: Positive for abdominal pain.  All other systems reviewed and are negative.     Allergies  Codeine and Ibuprofen  Home Medications   Prior to Admission medications   Medication Sig Start  Date End Date Taking? Authorizing Provider  acetaminophen (TYLENOL) 500 MG tablet Take 500 mg by mouth every 8 (eight) hours as needed for mild pain, moderate pain, fever or headache.    Yes Historical Provider, MD  amLODipine (NORVASC) 10 MG tablet Take 10 mg by mouth daily. 02/17/15  Yes Historical Provider, MD  aspirin 81 MG tablet Take 81 mg by mouth daily.   Yes Historical Provider, MD  atorvastatin (LIPITOR) 40 MG tablet Take 40 mg by mouth at bedtime.   Yes Historical Provider, MD  Cholecalciferol (VITAMIN D) 2000 UNITS CAPS Take 2,000 Units by mouth daily.   Yes Historical Provider, MD  docusate sodium (COLACE) 100 MG capsule Take 100 mg by mouth daily as needed for mild constipation.   Yes Historical Provider, MD  esomeprazole (NEXIUM) 40 MG capsule Take 40 mg by mouth daily at 12 noon.   Yes Historical Provider, MD   ezetimibe (ZETIA) 10 MG tablet Take 10 mg by mouth at bedtime.   Yes Historical Provider, MD  furosemide (LASIX) 40 MG tablet Take 40 mg by mouth daily.  04/17/13  Yes Historical Provider, MD  ketorolac (TORADOL) 60 MG/2ML SOLN injection Inject 60 mg into the vein once.   Yes Historical Provider, MD  Multiple Vitamins-Minerals (MULTIVITAMINS THER. W/MINERALS) TABS Take 1 tablet by mouth daily.   Yes Historical Provider, MD  ONE TOUCH ULTRA TEST test strip 1 each by Other route as needed (for blood sugar).  03/04/15  Yes Historical Provider, MD  pioglitazone (ACTOS) 30 MG tablet Take 30 mg by mouth daily.   Yes Historical Provider, MD  valsartan (DIOVAN) 320 MG tablet Take 320 mg by mouth daily.   Yes Historical Provider, MD  HYDROcodone-acetaminophen (NORCO/VICODIN) 5-325 MG tablet Take 1 tablet by mouth every 8 (eight) hours as needed. 09/14/15   Quintella Reichert, MD  ondansetron (ZOFRAN) 4 MG tablet Take 1 tablet (4 mg total) by mouth every 6 (six) hours. 09/14/15   Quintella Reichert, MD   BP 154/62 mmHg  Pulse 75  Temp(Src) 97.7 F (36.5 C) (Oral)  Resp 16  SpO2 99% Physical Exam  Constitutional: She is oriented to person, place, and time. She appears well-developed and well-nourished.  HENT:  Head: Normocephalic and atraumatic.  Cardiovascular: Normal rate and regular rhythm.   No murmur heard. Pulmonary/Chest: Effort normal and breath sounds normal. No respiratory distress.  Abdominal: Soft. There is no rebound and no guarding.  mild right upper quadrant tenderness  Musculoskeletal: She exhibits no tenderness.  Nonpitting edema of BLE  Neurological: She is alert and oriented to person, place, and time.  Skin: Skin is warm and dry.  Psychiatric: She has a normal mood and affect. Her behavior is normal.  Nursing note and vitals reviewed.   ED Course  Procedures (including critical care time) Labs Review Labs Reviewed  COMPREHENSIVE METABOLIC PANEL - Abnormal; Notable for the  following:    CO2 20 (*)    Glucose, Bld 172 (*)    BUN 21 (*)    Creatinine, Ser 1.32 (*)    ALT 13 (*)    GFR calc non Af Amer 38 (*)    GFR calc Af Amer 44 (*)    All other components within normal limits  CBC - Abnormal; Notable for the following:    RBC 3.75 (*)    Hemoglobin 10.7 (*)    HCT 32.0 (*)    All other components within normal limits  URINALYSIS, ROUTINE W REFLEX MICROSCOPIC (NOT  AT [facility]) - Abnormal; Notable for the following:    Hgb urine dipstick TRACE (*)    All other components within normal limits  URINE MICROSCOPIC-ADD ON - Abnormal; Notable for the following:    Squamous Epithelial / LPF 6-30 (*)    Bacteria, UA FEW (*)    All other components within normal limits  LIPASE, BLOOD  I-STAT TROPOININ, ED    Imaging Review US Abdomen Limited Ruq  09/14/2015  CLINICAL DATA:  Acute abdominal pain. History of hypertension and diabetes. EXAM: US ABDOMEN LIMITED - RIGHT UPPER QUADRANT COMPARISON:  Radiographs 09/08/2005.  CT 09/04/2005. FINDINGS: Gallbladder: Gallstones are present, measuring up to 2.9 cm. In addition, there is gallbladder sludge. The gallbladder wall thickness is upper limits of normal. There is no pericholecystic fluid or sonographic Murphy's sign. Common bile duct: Diameter: 3 mm Liver: No focal lesion identified. Within normal limits in parenchymal echogenicity. IMPRESSION: Cholelithiasis and gallbladder sludge. No evidence of cholecystitis or biliary dilatation. Electronically Signed   By: Richardean Sale M.D.   On: 09/14/2015 11:46   I have personally reviewed and evaluated these images and lab results as part of my medical decision-making.   EKG Interpretation   Date/Time:  Wednesday September 14 2015 11:14:21 EDT Ventricular Rate:  69 PR Interval:  168 QRS Duration: 112 QT Interval:  443 QTC Calculation: 475 R Axis:   55 Text Interpretation:  Sinus rhythm Borderline intraventricular conduction  delay Minimal ST depression, diffuse leads  Baseline wander in lead(s) V5  Confirmed by Hazle Coca [phone]) on 09/14/2015 11:21:39 AM      MDM   Final diagnoses:  RUQ abdominal pain  Biliary colic    Patient here for evaluation of right upper quadrant abdominal pain. She is in no acute distress. Emergency department. EKG with no significant change from priors. Mild right upper quadrant tenderness on initial evaluation but declined pain medications. On repeat evaluation she has no abdominal tenderness on examination. Ultrasound does demonstrate cholelithiasis and sludge but no evidence of cholecystitis. Presentation is not consistent with ACS, PE, dissection, cholecystitis, renal colic, UTI.  Discussed follow-up with surgery, PCP. Discussed close home care and return precautions.  Quintella Reichert, MD 09/14/15 1539

## 2015-09-14 NOTE — ED Notes (Signed)
Patient transported to Ultrasound 

## 2015-09-14 NOTE — Discharge Instructions (Signed)
Biliary Colic Biliary colic is a pain in the upper abdomen. The pain:  Is usually felt on the right side of the abdomen, but it may also be felt in the center of the abdomen, just below the breastbone (sternum).  May spread back toward the right shoulder blade.  May be steady or irregular.  May be accompanied by nausea and vomiting. Most of the time, the pain goes away in 1-5 hours. After the most intense pain passes, the abdomen may continue to ache mildly for about 24 hours. Biliary colic is caused by a blockage in the bile duct. The bile duct is a pathway that carries bile--a liquid that helps to digest fats--from the gallbladder to the small intestine. Biliary colic usually occurs after eating, when the digestive system demands bile. The pain develops when muscle cells contract forcefully to try to move the blockage so that bile can get by. HOME CARE INSTRUCTIONS  Take medicines only as directed by your health care provider.  Drink enough fluid to keep your urine clear or pale yellow.  Avoid fatty, greasy, and fried foods. These kinds of foods increase your body's demand for bile.  Avoid any foods that make your pain worse.  Avoid overeating.  Avoid having a large meal after fasting. SEEK MEDICAL CARE IF:  You develop a fever.  Your pain gets worse.  You vomit.  You develop nausea that prevents you from eating and drinking. SEEK IMMEDIATE MEDICAL CARE IF:  You suddenly develop a fever and shaking chills.  You develop a yellowish discoloration (jaundice) of:  Skin.  Whites of the eyes.  Mucous membranes.  You have continuous or severe pain that is not relieved with medicines.  You have nausea and vomiting that is not relieved with medicines.  You develop dizziness or you faint.   This information is not intended to replace advice given to you by your health care provider. Make sure you discuss any questions you have with your health care provider.   Document  Released: 10/22/2005 Document Revised: 10/05/2014 Document Reviewed: 03/02/2014 Elsevier Interactive Patient Education 2016 [employer]  Abdominal Pain, Adult Many things can cause abdominal pain. Usually, abdominal pain is not caused by a disease and will improve without treatment. It can often be observed and treated at home. Your health care provider will do a physical exam and possibly order blood tests and X-rays to help determine the seriousness of your pain. However, in many cases, more time must pass before a clear cause of the pain can be found. Before that point, your health care provider may not know if you need more testing or further treatment. HOME CARE INSTRUCTIONS Monitor your abdominal pain for any changes. The following actions may help to alleviate any discomfort you are experiencing:  Only take over-the-counter or prescription medicines as directed by your health care provider.  Do not take laxatives unless directed to do so by your health care provider.  Try a clear liquid diet (broth, tea, or water) as directed by your health care provider. Slowly move to a bland diet as tolerated. SEEK MEDICAL CARE IF:  You have unexplained abdominal pain.  You have abdominal pain associated with nausea or diarrhea.  You have pain when you urinate or have a bowel movement.  You experience abdominal pain that wakes you in the night.  You have abdominal pain that is worsened or improved by eating food.  You have abdominal pain that is worsened with eating fatty foods.  You  have a fever. SEEK IMMEDIATE MEDICAL CARE IF:  Your pain does not go away within 2 hours.  You keep throwing up (vomiting).  Your pain is felt only in portions of the abdomen, such as the right side or the left lower portion of the abdomen.  You pass bloody or black tarry stools. MAKE SURE YOU:  Understand these instructions.  Will watch your condition.  Will get help right away if you are not  doing well or get worse.   This information is not intended to replace advice given to you by your health care provider. Make sure you discuss any questions you have with your health care provider.   Document Released: 02/28/2005 Document Revised: 02/09/2015 Document Reviewed: 01/28/2013 Elsevier Interactive Patient Education [employer].

## 2015-09-26 ENCOUNTER — Other Ambulatory Visit: Payer: Self-pay | Admitting: Surgery

## 2015-09-26 DIAGNOSIS — K8020 Calculus of gallbladder without cholecystitis without obstruction: Secondary | ICD-10-CM | POA: Diagnosis not present

## 2015-09-30 ENCOUNTER — Encounter (HOSPITAL_COMMUNITY): Payer: Self-pay | Admitting: *Deleted

## 2015-10-03 MED ORDER — CEFAZOLIN SODIUM-DEXTROSE 2-4 GM/100ML-% IV SOLN
2.0000 g | INTRAVENOUS | Status: DC
  Filled 2015-10-03: qty 100

## 2015-10-03 NOTE — H&P (Signed)
[name] 09/26/2015 9:57 AM Location: [facility] Patient #: [id number] DOB: [birth year] Widowed / Language: [place] / Race: Black or African American Female   History of Present Illness (Tiffany Sangiovanni A. Ninfa Linden MD; 09/26/2015 10:12 AM) Patient words: New-gallbladder.  The patient is a 80 year old female who presents for evaluation of gall stones. This is a pleasant female referred to me by Dr. Melinda Crutch for evaluation of symptomatic cholelithiasis. She has had multiple attacks from her gallstones. The most recent was several weeks ago resulting in a visit to the emergency department for sharp, severe epigastric and right upper quadrant abdominal pain with dry heaves. She has had emesis in the past. This always occurs after fatty meals. She is otherwise without complaints. She is pain-free today.   Other Problems [name], CMA; 09/26/2015 9:58 AM) Arthritis Back Pain Diabetes Mellitus Gastroesophageal Reflux Disease Heart murmur High blood pressure Hypercholesterolemia Sleep Apnea  Past Surgical History [name], CMA; 09/26/2015 9:58 AM) Appendectomy Hysterectomy (not due to cancer) - Partial Knee Surgery Bilateral. Shoulder Surgery Left.  Diagnostic Studies History [name], CMA; 09/26/2015 9:58 AM) Colonoscopy 1-5 years ago Mammogram within last year Pap Smear 1-5 years ago  Allergies [name], CMA; 09/26/2015 9:59 AM) Codeine Sulfate *ANALGESICS - OPIOID* Ibuprofen *ANALGESICS - ANTI-INFLAMMATORY*  Medication History ([name], CMA; 09/26/2015 10:00 AM) Hydrocodone-Acetaminophen (5-325MG  Tablet, Oral) Active. AmLODIPine Besylate (10MG  Tablet, Oral) Active. Esomeprazole Magnesium (40MG  Capsule DR, Oral) Active. Furosemide (40MG  Tablet, Oral) Active. Pioglitazone HCl (30MG  Tablet, Oral) Active. Valsartan (320MG  Tablet, Oral) Active. Tylenol (500MG  Capsule, Oral) Active. Aspirin (81MG  Tablet, Oral)  Active. Vitamin D (2000UNIT Capsule, Oral) Active. Multivitamin Adult (Oral) Active. Medications Reconciled  Social History [name], CMA; 09/26/2015 9:58 AM) Alcohol use Occasional alcohol use. Caffeine use Carbonated beverages, Coffee, Tea.  Family History [name], CMA; 09/26/2015 9:58 AM) Diabetes Mellitus Father, Mother, Sister. Kidney Disease Mother. Respiratory Condition Brother.  Pregnancy / Birth History [name], CMA; 09/26/2015 9:58 AM) Age at menarche 54 years. Gravida 2 Maternal age 61-30 Para 2    Review of Systems [name] CMA; 09/26/2015 9:58 AM) General Not Present- Appetite Loss, Chills, Fatigue, Fever, Night Sweats, Weight Gain and Weight Loss. Skin Not Present- Change in Wart/Mole, Dryness, Hives, Jaundice, New Lesions, Non-Healing Wounds, Rash and Ulcer. HEENT Present- Seasonal Allergies and Wears glasses/contact lenses. Not Present- Earache, Hearing Loss, Hoarseness, Nose Bleed, Oral Ulcers, Ringing in the Ears, Sinus Pain, Sore Throat, Visual Disturbances and Yellow Eyes. Respiratory Not Present- Bloody sputum, Chronic Cough, Difficulty Breathing, Snoring and Wheezing. Breast Not Present- Breast Mass, Breast Pain, Nipple Discharge and Skin Changes. Female Genitourinary Present- Frequency and Nocturia. Not Present- Painful Urination, Pelvic Pain and Urgency. Musculoskeletal Present- Back Pain, Joint Pain and Joint Stiffness. Not Present- Muscle Pain, Muscle Weakness and Swelling of Extremities. Neurological Not Present- Decreased Memory, Fainting, Headaches, Numbness, Seizures, Tingling, Tremor, Trouble walking and Weakness. Psychiatric Not Present- Anxiety, Bipolar, Change in Sleep Pattern, Depression, Fearful and Frequent crying. Endocrine Not Present- Cold Intolerance, Excessive Hunger, Hair Changes, Heat Intolerance, Hot flashes and New Diabetes. Hematology Not Present- Easy Bruising, Excessive bleeding, Gland problems, HIV  and Persistent Infections.  Vitals ([name] CMA; 09/26/2015 9:58 AM) 09/26/2015 9:58 AM Weight: 219.8 lb Height: 61.5in Body Surface Area: 1.98 m Body Mass Index: 40.86 kg/m  Temp.: 98.91F(Oral)  Pulse: 72 (Regular)  BP: 140/76 (Sitting, Left Arm, Standard)       Physical Exam ([name] A. Ninfa Linden MD; 09/26/2015 10:12 AM) The physical exam  findings are as follows: Note:morbidly obese  General Mental Status-Alert. General Appearance-Consistent with stated age. Hydration-Well hydrated. Voice-Normal.  Head and Neck Head-normocephalic, atraumatic with no lesions or palpable masses.  Eye Eyeball - Bilateral-Extraocular movements intact. Sclera/Conjunctiva - Bilateral-No scleral icterus.  Chest and Lung Exam Chest and lung exam reveals -quiet, even and easy respiratory effort with no use of accessory muscles and on auscultation, normal breath sounds, no adventitious sounds and normal vocal resonance. Inspection Chest Wall - Normal. Back - normal.  Cardiovascular Cardiovascular examination reveals -on palpation PMI is normal in location and amplitude, no palpable S3 or S4. Normal cardiac borders., normal heart sounds, regular rate and rhythm with no murmurs, carotid auscultation reveals no bruits and normal pedal pulses bilaterally.  Abdomen Inspection Inspection of the abdomen reveals - No Hernias. Skin - Scar - no surgical scars. Palpation/Percussion Palpation and Percussion of the abdomen reveal - Soft, Non Tender, No Rebound tenderness, No Rigidity (guarding) and No hepatosplenomegaly. Auscultation Auscultation of the abdomen reveals - Bowel sounds normal.  Neurologic Neurologic evaluation reveals -alert and oriented x 3 with no impairment of recent or remote memory. Mental Status-Normal.  Musculoskeletal Normal Exam - Left-Upper Extremity Strength Normal and Lower Extremity Strength Normal. Normal Exam - Right-Upper  Extremity Strength Normal, Lower Extremity Weakness.    Assessment & Plan ([name] A. Ninfa Linden MD; 09/26/2015 10:13 AM) SYMPTOMATIC CHOLELITHIASIS (K80.20) Impression: I discussed the diagnosis with the patient and her daughter. Laparoscopic cholecystectomy is recommended. I gave him literature regarding the surgery and discussed in detail. I discussed the risk which includes but is not limited to bleeding, infection, bile duct injury, bile leak, the need to convert to an open procedure, cardiopulmonary issues, postoperative recovery, etc. They understand and wish to proceed with surgery which will be scheduled.

## 2015-10-04 ENCOUNTER — Ambulatory Visit (HOSPITAL_COMMUNITY): Payer: 59 | Admitting: Anesthesiology

## 2015-10-04 ENCOUNTER — Observation Stay (HOSPITAL_COMMUNITY)
Admission: RE | Admit: 2015-10-04 | Discharge: 2015-10-05 | Disposition: A | Discharge status: Home / Self Care | Payer: 59 | Source: Ambulatory Visit | Attending: Surgery | Admitting: Surgery

## 2015-10-04 ENCOUNTER — Encounter (HOSPITAL_COMMUNITY): Payer: Self-pay | Admitting: *Deleted

## 2015-10-04 ENCOUNTER — Encounter (HOSPITAL_COMMUNITY)
Admission: RE | Disposition: A | Discharge status: Home / Self Care | Payer: Self-pay | Source: Ambulatory Visit | Attending: Surgery

## 2015-10-04 DIAGNOSIS — E119 Type 2 diabetes mellitus without complications: Secondary | ICD-10-CM | POA: Insufficient documentation

## 2015-10-04 DIAGNOSIS — G4730 Sleep apnea, unspecified: Secondary | ICD-10-CM | POA: Diagnosis not present

## 2015-10-04 DIAGNOSIS — Z79891 Long term (current) use of opiate analgesic: Secondary | ICD-10-CM | POA: Insufficient documentation

## 2015-10-04 DIAGNOSIS — Z6841 Body Mass Index (BMI) 40.0 and over, adult: Secondary | ICD-10-CM | POA: Diagnosis not present

## 2015-10-04 DIAGNOSIS — K8010 Calculus of gallbladder with chronic cholecystitis without obstruction: Principal | ICD-10-CM | POA: Insufficient documentation

## 2015-10-04 DIAGNOSIS — Z79899 Other long term (current) drug therapy: Secondary | ICD-10-CM | POA: Insufficient documentation

## 2015-10-04 DIAGNOSIS — Z9049 Acquired absence of other specified parts of digestive tract: Secondary | ICD-10-CM

## 2015-10-04 DIAGNOSIS — K219 Gastro-esophageal reflux disease without esophagitis: Secondary | ICD-10-CM | POA: Insufficient documentation

## 2015-10-04 DIAGNOSIS — I10 Essential (primary) hypertension: Secondary | ICD-10-CM | POA: Diagnosis not present

## 2015-10-04 DIAGNOSIS — K8020 Calculus of gallbladder without cholecystitis without obstruction: Secondary | ICD-10-CM | POA: Diagnosis present

## 2015-10-04 DIAGNOSIS — M1990 Unspecified osteoarthritis, unspecified site: Secondary | ICD-10-CM | POA: Diagnosis not present

## 2015-10-04 DIAGNOSIS — Z7982 Long term (current) use of aspirin: Secondary | ICD-10-CM | POA: Insufficient documentation

## 2015-10-04 DIAGNOSIS — E7800 Pure hypercholesterolemia, unspecified: Secondary | ICD-10-CM | POA: Diagnosis not present

## 2015-10-04 HISTORY — PX: CHOLECYSTECTOMY: SHX55

## 2015-10-04 LAB — GLUCOSE, CAPILLARY
GLUCOSE-CAPILLARY: 102 mg/dL — AB (ref 65–99)
GLUCOSE-CAPILLARY: 168 mg/dL — AB (ref 65–99)
Glucose-Capillary: 133 mg/dL — ABNORMAL HIGH (ref 65–99)

## 2015-10-04 SURGERY — LAPAROSCOPIC CHOLECYSTECTOMY
Anesthesia: General | Site: Abdomen

## 2015-10-04 MED ORDER — PROPOFOL 10 MG/ML IV BOLUS
INTRAVENOUS | Status: AC
  Filled 2015-10-04: qty 20

## 2015-10-04 MED ORDER — INSULIN ASPART 100 UNIT/ML ~~LOC~~ SOLN
0.0000 [IU] | Freq: Three times a day (TID) | SUBCUTANEOUS | Status: DC
  Administered 2015-10-05: 2 [IU] via SUBCUTANEOUS

## 2015-10-04 MED ORDER — BUPIVACAINE HCL (PF) 0.5 % IJ SOLN
INTRAMUSCULAR | Status: AC
  Filled 2015-10-04: qty 30

## 2015-10-04 MED ORDER — ROCURONIUM BROMIDE 100 MG/10ML IV SOLN
INTRAVENOUS | Status: DC | PRN
  Administered 2015-10-04: 20 mg via INTRAVENOUS

## 2015-10-04 MED ORDER — IRBESARTAN 300 MG PO TABS
300.0000 mg | ORAL_TABLET | Freq: Every day | ORAL | Status: DC
  Administered 2015-10-05: 300 mg via ORAL
  Filled 2015-10-04: qty 1

## 2015-10-04 MED ORDER — ONDANSETRON HCL 4 MG/2ML IJ SOLN
INTRAMUSCULAR | Status: AC
Start: 2015-10-04 — End: 2015-10-04
  Filled 2015-10-04: qty 2

## 2015-10-04 MED ORDER — FENTANYL CITRATE (PF) 100 MCG/2ML IJ SOLN
INTRAMUSCULAR | Status: AC
  Filled 2015-10-04: qty 2

## 2015-10-04 MED ORDER — FENTANYL CITRATE (PF) 100 MCG/2ML IJ SOLN
INTRAMUSCULAR | Status: DC | PRN
  Administered 2015-10-04 (×2): 50 ug via INTRAVENOUS

## 2015-10-04 MED ORDER — MORPHINE SULFATE (PF) 2 MG/ML IV SOLN
1.0000 mg | INTRAVENOUS | Status: DC | PRN
  Administered 2015-10-04: 2 mg via INTRAVENOUS
  Filled 2015-10-04: qty 1

## 2015-10-04 MED ORDER — ENOXAPARIN SODIUM 30 MG/0.3ML ~~LOC~~ SOLN
30.0000 mg | SUBCUTANEOUS | Status: DC
  Administered 2015-10-05: 30 mg via SUBCUTANEOUS
  Filled 2015-10-04 (×2): qty 0.3

## 2015-10-04 MED ORDER — ROCURONIUM BROMIDE 100 MG/10ML IV SOLN
INTRAVENOUS | Status: AC
  Filled 2015-10-04: qty 1

## 2015-10-04 MED ORDER — LACTATED RINGERS IV SOLN
INTRAVENOUS | Status: DC
  Administered 2015-10-04: 1000 mL via INTRAVENOUS

## 2015-10-04 MED ORDER — ONDANSETRON HCL 4 MG/2ML IJ SOLN
4.0000 mg | Freq: Four times a day (QID) | INTRAMUSCULAR | Status: DC | PRN
  Administered 2015-10-04: 4 mg via INTRAVENOUS
  Filled 2015-10-04: qty 2

## 2015-10-04 MED ORDER — ONDANSETRON 4 MG PO TBDP: 4.0000 mg | ORAL_TABLET | Freq: Four times a day (QID) | ORAL | Status: DC | PRN

## 2015-10-04 MED ORDER — LIDOCAINE HCL (CARDIAC) 20 MG/ML IV SOLN
INTRAVENOUS | Status: DC | PRN
  Administered 2015-10-04: 100 mg via INTRAVENOUS

## 2015-10-04 MED ORDER — CEFAZOLIN SODIUM-DEXTROSE 2-4 GM/100ML-% IV SOLN
INTRAVENOUS | Status: AC
  Filled 2015-10-04: qty 100

## 2015-10-04 MED ORDER — LIDOCAINE HCL (CARDIAC) 20 MG/ML IV SOLN
INTRAVENOUS | Status: AC
  Filled 2015-10-04: qty 5

## 2015-10-04 MED ORDER — LACTATED RINGERS IR SOLN
Status: DC | PRN
  Administered 2015-10-04: 1000 mL

## 2015-10-04 MED ORDER — PANTOPRAZOLE SODIUM 40 MG PO TBEC
40.0000 mg | DELAYED_RELEASE_TABLET | Freq: Every day | ORAL | Status: DC
  Administered 2015-10-05: 40 mg via ORAL
  Filled 2015-10-04: qty 1

## 2015-10-04 MED ORDER — FUROSEMIDE 40 MG PO TABS
40.0000 mg | ORAL_TABLET | Freq: Every day | ORAL | Status: DC
  Administered 2015-10-04 – 2015-10-05 (×2): 40 mg via ORAL
  Filled 2015-10-04 (×2): qty 1

## 2015-10-04 MED ORDER — DEXAMETHASONE SODIUM PHOSPHATE 10 MG/ML IJ SOLN
INTRAMUSCULAR | Status: DC | PRN
  Administered 2015-10-04: 10 mg via INTRAVENOUS

## 2015-10-04 MED ORDER — DIPHENHYDRAMINE HCL 50 MG/ML IJ SOLN: 12.5000 mg | Freq: Four times a day (QID) | INTRAMUSCULAR | Status: DC | PRN

## 2015-10-04 MED ORDER — ONDANSETRON HCL 4 MG/2ML IJ SOLN
INTRAMUSCULAR | Status: DC | PRN
  Administered 2015-10-04: 4 mg via INTRAVENOUS

## 2015-10-04 MED ORDER — DEXAMETHASONE SODIUM PHOSPHATE 10 MG/ML IJ SOLN
INTRAMUSCULAR | Status: AC
  Filled 2015-10-04: qty 1

## 2015-10-04 MED ORDER — HYDROMORPHONE HCL 1 MG/ML IJ SOLN
INTRAMUSCULAR | Status: AC
  Filled 2015-10-04: qty 1

## 2015-10-04 MED ORDER — ACETAMINOPHEN 325 MG PO TABS: 650.0000 mg | ORAL_TABLET | Freq: Four times a day (QID) | ORAL | Status: DC | PRN

## 2015-10-04 MED ORDER — IOPAMIDOL (ISOVUE-300) INJECTION 61%
INTRAVENOUS | Status: AC
Start: 2015-10-04 — End: 2015-10-04
  Filled 2015-10-04: qty 50

## 2015-10-04 MED ORDER — HYDROCODONE-ACETAMINOPHEN 5-325 MG PO TABS
1.0000 | ORAL_TABLET | ORAL | Status: DC | PRN
  Administered 2015-10-04 – 2015-10-05 (×2): 1 via ORAL
  Filled 2015-10-04 (×2): qty 1

## 2015-10-04 MED ORDER — INSULIN ASPART 100 UNIT/ML ~~LOC~~ SOLN: 0.0000 [IU] | Freq: Every day | SUBCUTANEOUS | Status: DC

## 2015-10-04 MED ORDER — EZETIMIBE 10 MG PO TABS
10.0000 mg | ORAL_TABLET | Freq: Every day | ORAL | Status: DC
  Administered 2015-10-04: 10 mg via ORAL
  Filled 2015-10-04 (×2): qty 1

## 2015-10-04 MED ORDER — BUPIVACAINE HCL (PF) 0.5 % IJ SOLN
INTRAMUSCULAR | Status: DC | PRN
  Administered 2015-10-04: 21 mL

## 2015-10-04 MED ORDER — SUCCINYLCHOLINE CHLORIDE 20 MG/ML IJ SOLN
INTRAMUSCULAR | Status: DC | PRN
  Administered 2015-10-04: 100 mg via INTRAVENOUS

## 2015-10-04 MED ORDER — MEPERIDINE HCL 50 MG/ML IJ SOLN: 6.2500 mg | INTRAMUSCULAR | Status: DC | PRN

## 2015-10-04 MED ORDER — SUGAMMADEX SODIUM 200 MG/2ML IV SOLN
INTRAVENOUS | Status: AC
  Filled 2015-10-04: qty 2

## 2015-10-04 MED ORDER — DIPHENHYDRAMINE HCL 12.5 MG/5ML PO ELIX: 12.5000 mg | ORAL_SOLUTION | Freq: Four times a day (QID) | ORAL | Status: DC | PRN

## 2015-10-04 MED ORDER — LACTATED RINGERS IV SOLN
INTRAVENOUS | Status: DC | PRN
  Administered 2015-10-04: 10:00:00 via INTRAVENOUS

## 2015-10-04 MED ORDER — PROPOFOL 10 MG/ML IV BOLUS
INTRAVENOUS | Status: DC | PRN
  Administered 2015-10-04: 160 mg via INTRAVENOUS

## 2015-10-04 MED ORDER — SODIUM CHLORIDE 0.9 % IV SOLN
INTRAVENOUS | Status: DC
  Administered 2015-10-04: 15:00:00 via INTRAVENOUS

## 2015-10-04 MED ORDER — ACETAMINOPHEN 650 MG RE SUPP: 650.0000 mg | Freq: Four times a day (QID) | RECTAL | Status: DC | PRN

## 2015-10-04 MED ORDER — CEFAZOLIN SODIUM-DEXTROSE 2-4 GM/100ML-% IV SOLN
2.0000 g | INTRAVENOUS | Status: AC
  Administered 2015-10-04: 2 g via INTRAVENOUS

## 2015-10-04 MED ORDER — PIOGLITAZONE HCL 30 MG PO TABS
30.0000 mg | ORAL_TABLET | Freq: Every day | ORAL | Status: DC
  Administered 2015-10-05: 30 mg via ORAL
  Filled 2015-10-04: qty 1

## 2015-10-04 MED ORDER — SUGAMMADEX SODIUM 200 MG/2ML IV SOLN
INTRAVENOUS | Status: DC | PRN
Start: 2015-10-04 — End: 2015-10-04
  Administered 2015-10-04: 200 mg via INTRAVENOUS

## 2015-10-04 MED ORDER — AMLODIPINE BESYLATE 10 MG PO TABS
10.0000 mg | ORAL_TABLET | Freq: Every day | ORAL | Status: DC
  Administered 2015-10-05: 10 mg via ORAL
  Filled 2015-10-04: qty 1

## 2015-10-04 MED ORDER — HYDROMORPHONE HCL 1 MG/ML IJ SOLN
0.2500 mg | INTRAMUSCULAR | Status: DC | PRN
  Administered 2015-10-04 (×3): 0.5 mg via INTRAVENOUS

## 2015-10-04 MED ORDER — 0.9 % SODIUM CHLORIDE (POUR BTL) OPTIME
TOPICAL | Status: DC | PRN
  Administered 2015-10-04: 1000 mL

## 2015-10-04 SURGICAL SUPPLY — 36 items
APL SKNCLS STERI-STRIP NONHPOA (GAUZE/BANDAGES/DRESSINGS)
APPLIER CLIP 5 13 M/L LIGAMAX5 (MISCELLANEOUS) ×3
APR CLP MED LRG 5 ANG JAW (MISCELLANEOUS) ×1
BAG SPEC RTRVL LRG 6X4 10 (ENDOMECHANICALS) ×1
BANDAGE ADH SHEER 1  50/CT (GAUZE/BANDAGES/DRESSINGS) IMPLANT
BENZOIN TINCTURE PRP APPL 2/3 (GAUZE/BANDAGES/DRESSINGS) IMPLANT
CHLORAPREP W/TINT 26ML (MISCELLANEOUS) ×3 IMPLANT
CLIP APPLIE 5 13 M/L LIGAMAX5 (MISCELLANEOUS) ×1 IMPLANT
CLOSURE WOUND 1/2 X4 (GAUZE/BANDAGES/DRESSINGS)
COVER MAYO STAND STRL (DRAPES) IMPLANT
COVER SURGICAL LIGHT HANDLE (MISCELLANEOUS) ×3 IMPLANT
DECANTER SPIKE VIAL GLASS SM (MISCELLANEOUS) ×1 IMPLANT
DRAPE C-ARM 42X120 X-RAY (DRAPES) IMPLANT
DRAPE LAPAROSCOPIC ABDOMINAL (DRAPES) ×1 IMPLANT
ELECT REM PT RETURN 9FT ADLT (ELECTROSURGICAL) ×3
ELECTRODE REM PT RTRN 9FT ADLT (ELECTROSURGICAL) ×1 IMPLANT
GLOVE SURG SIGNA 7.5 PF LTX (GLOVE) ×3 IMPLANT
GOWN STRL REUS W/TWL XL LVL3 (GOWN DISPOSABLE) ×10 IMPLANT
HEMOSTAT SURGICEL 4X8 (HEMOSTASIS) IMPLANT
KIT BASIN OR (CUSTOM PROCEDURE TRAY) ×3 IMPLANT
LIQUID BAND (GAUZE/BANDAGES/DRESSINGS) ×2 IMPLANT
PAD POSITIONING PINK XL (MISCELLANEOUS) IMPLANT
POSITIONER SURGICAL ARM (MISCELLANEOUS) IMPLANT
POUCH SPECIMEN RETRIEVAL 10MM (ENDOMECHANICALS) ×3 IMPLANT
SET CHOLANGIOGRAPH MIX (MISCELLANEOUS) IMPLANT
SET IRRIG TUBING LAPAROSCOPIC (IRRIGATION / IRRIGATOR) ×3 IMPLANT
STRIP CLOSURE SKIN 1/2X4 (GAUZE/BANDAGES/DRESSINGS) IMPLANT
SUT MNCRL AB 4-0 PS2 18 (SUTURE) ×3 IMPLANT
SUT VICRYL 0 UR6 27IN ABS (SUTURE) ×2 IMPLANT
TAPE CLOTH 4X10 WHT NS (GAUZE/BANDAGES/DRESSINGS) IMPLANT
TOWEL OR 17X26 10 PK STRL BLUE (TOWEL DISPOSABLE) ×3 IMPLANT
TRAY LAPAROSCOPIC (CUSTOM PROCEDURE TRAY) ×3 IMPLANT
TROCAR BLADELESS OPT 5 75 (ENDOMECHANICALS) ×3 IMPLANT
TROCAR SLEEVE XCEL 5X75 (ENDOMECHANICALS) ×6 IMPLANT
TROCAR XCEL BLUNT TIP 100MML (ENDOMECHANICALS) ×3 IMPLANT
TUBING INSUF HEATED (TUBING) ×2 IMPLANT

## 2015-10-04 NOTE — Op Note (Signed)

## 2015-10-04 NOTE — Anesthesia Postprocedure Evaluation (Signed)
Anesthesia Post Note  Patient: [name]  Procedure(s) Performed: Procedure(s) (LRB): LAPAROSCOPIC CHOLECYSTECTOMY (N/A)  Patient location during evaluation: PACU Anesthesia Type: General Level of consciousness: awake and alert Pain management: pain level controlled Vital Signs Assessment: post-procedure vital signs reviewed and stable Respiratory status: spontaneous breathing, nonlabored ventilation, respiratory function stable and patient connected to nasal cannula oxygen Cardiovascular status: blood pressure returned to baseline and stable Postop Assessment: no signs of nausea or vomiting Anesthetic complications: no    Last Vitals:  Filed Vitals:   10/04/15 1300 10/04/15 1315  BP: 133/60 124/54  Pulse: 70 72  Temp:  36.7 C  Resp: 14 15    Last Pain:  Filed Vitals:   10/04/15 1329  PainSc: 2                  Effie Berkshire

## 2015-10-04 NOTE — Interval H&P Note (Signed)
History and Physical Interval Note: no change in H and P  10/04/2015 10:10 AM  [name]  has presented today for surgery, with the diagnosis of Symptomatic cholelithiasis  The various methods of treatment have been discussed with the patient and family. After consideration of risks, benefits and other options for treatment, the patient has consented to  Procedure(s): LAPAROSCOPIC CHOLECYSTECTOMY (N/A) as a surgical intervention .  The patient's history has been reviewed, patient examined, no change in status, stable for surgery.  I have reviewed the patient's chart and labs.  Questions were answered to the patient's satisfaction.     Juandavid Dallman A

## 2015-10-04 NOTE — Anesthesia Procedure Notes (Signed)
Procedure Name: Intubation Date/Time: 10/04/2015 10:16 AM Performed by: Lind Covert Pre-anesthesia Checklist: Patient identified, Emergency Drugs available, Suction available, Patient being monitored and Timeout performed Patient Re-evaluated:Patient Re-evaluated prior to inductionOxygen Delivery Method: Circle system utilized Preoxygenation: Pre-oxygenation with 100% oxygen Intubation Type: IV induction Laryngoscope Size: Mac and 4 Grade View: Grade I Tube type: Oral Tube size: 7.0 mm Number of attempts: 1 Airway Equipment and Method: Stylet Placement Confirmation: ETT inserted through vocal cords under direct vision,  positive ETCO2 and breath sounds checked- equal and bilateral Secured at: 22 cm Tube secured with: Tape Dental Injury: Teeth and Oropharynx as per pre-operative assessment

## 2015-10-04 NOTE — Transfer of Care (Signed)
Immediate Anesthesia Transfer of Care Note  Patient: [name]  Procedure(s) Performed: Procedure(s): LAPAROSCOPIC CHOLECYSTECTOMY (N/A)  Patient Location: PACU  Anesthesia Type:General  Level of Consciousness: sedated  Airway & Oxygen Therapy: Patient Spontanous Breathing and Patient connected to face mask oxygen  Post-op Assessment: Report given to RN and Post -op Vital signs reviewed and stable  Post vital signs: Reviewed and stable  Last Vitals:  Filed Vitals:   10/04/15 0810  BP: 145/70  Pulse: 71  Temp: 36.7 C  Resp: 18    Last Pain: There were no vitals filed for this visit.    Patients Stated Pain Goal: 4 ([id number])  Complications: No apparent anesthesia complications

## 2015-10-04 NOTE — Anesthesia Preprocedure Evaluation (Addendum)
Anesthesia Evaluation  Patient identified by MRN, date of birth, ID band Patient awake    Reviewed: Allergy & Precautions, NPO status , Patient's Chart, lab work & pertinent test results  Airway Mallampati: II  TM Distance: >3 FB Neck ROM: Full    Dental  (+) Edentulous Upper, Dental Advisory Given   Pulmonary sleep apnea and Continuous Positive Airway Pressure Ventilation ,    breath sounds clear to auscultation       Cardiovascular hypertension, Pt. on medications + Peripheral Vascular Disease  + Valvular Problems/Murmurs  Rhythm:Regular Rate:Normal     Neuro/Psych negative neurological ROS  negative psych ROS   GI/Hepatic Neg liver ROS, GERD  Medicated,  Endo/Other  diabetes, Type 2  Renal/GU CRFRenal disease  negative genitourinary   Musculoskeletal  (+) Arthritis ,   Abdominal   Peds negative pediatric ROS (+)  Hematology negative hematology ROS (+)   Anesthesia Other Findings - HLD - Pulm HTN   Reproductive/Obstetrics negative OB ROS                            Lab Results  Component Value Date   WBC 6.7 09/14/2015   HGB 10.7* 09/14/2015   HCT 32.0* 09/14/2015   MCV 85.3 09/14/2015   PLT 260 09/14/2015   Lab Results  Component Value Date   CREATININE 1.32* 09/14/2015   BUN 21* 09/14/2015   NA 141 09/14/2015   K 3.9 09/14/2015   CL 108 09/14/2015   CO2 20* 09/14/2015   Lab Results  Component Value Date   INR 1.11 06/21/2011   INR 1.03 04/25/2010   03/2015 Echo - Left ventricle: The cavity size was normal. Wall thickness was normal. Systolic function was normal. The estimated ejection fraction was in the range of 60% to 65%. - Mitral valve: There was mild regurgitation. - Left atrium: The atrium was moderately dilated. - Pulmonary arteries: PA peak pressure: 47 mm Hg (S).     Anesthesia Physical Anesthesia Plan  ASA: III  Anesthesia Plan: General    Post-op Pain Management:    Induction: Intravenous  Airway Management Planned: Oral ETT  Additional Equipment:   Intra-op Plan:   Post-operative Plan: Extubation in OR  Informed Consent: I have reviewed the patients History and Physical, chart, labs and discussed the procedure including the risks, benefits and alternatives for the proposed anesthesia with the patient or authorized representative who has indicated his/her understanding and acceptance.   Dental advisory given  Plan Discussed with: CRNA  Anesthesia Plan Comments:         Anesthesia Quick Evaluation

## 2015-10-05 DIAGNOSIS — E119 Type 2 diabetes mellitus without complications: Secondary | ICD-10-CM | POA: Diagnosis not present

## 2015-10-05 DIAGNOSIS — E7800 Pure hypercholesterolemia, unspecified: Secondary | ICD-10-CM | POA: Diagnosis not present

## 2015-10-05 DIAGNOSIS — G4730 Sleep apnea, unspecified: Secondary | ICD-10-CM | POA: Diagnosis not present

## 2015-10-05 DIAGNOSIS — M1990 Unspecified osteoarthritis, unspecified site: Secondary | ICD-10-CM | POA: Diagnosis not present

## 2015-10-05 DIAGNOSIS — I10 Essential (primary) hypertension: Secondary | ICD-10-CM | POA: Diagnosis not present

## 2015-10-05 DIAGNOSIS — K8010 Calculus of gallbladder with chronic cholecystitis without obstruction: Secondary | ICD-10-CM | POA: Diagnosis not present

## 2015-10-05 LAB — GLUCOSE, CAPILLARY
GLUCOSE-CAPILLARY: 124 mg/dL — AB (ref 65–99)
GLUCOSE-CAPILLARY: 150 mg/dL — AB (ref 65–99)

## 2015-10-05 LAB — HEMOGLOBIN A1C
HEMOGLOBIN A1C: 6.4 % — AB (ref 4.8–5.6)
MEAN PLASMA GLUCOSE: 137 mg/dL

## 2015-10-05 MED ORDER — HYDROCODONE-ACETAMINOPHEN 5-325 MG PO TABS: 1.0000 | ORAL_TABLET | ORAL | Status: DC | PRN

## 2015-10-05 NOTE — Discharge Summary (Signed)
Physician Discharge Summary  Patient ID: [name] MRN: [id number] DOB/AGE: 1935-09-08 80 y.o.  Admit date: 10/04/2015 Discharge date: 10/05/2015  Admission Diagnoses:  Discharge Diagnoses:  Active Problems:   S/P laparoscopic cholecystectomy symptomatic cholelithiasis Sleep apnea  Discharged Condition: good  Hospital Course: uneventful post op recovery s/p surgery.  Discharged pod#1.  Consults: None  Significant Diagnostic Studies:   Treatments: surgery: lap chole  Discharge Exam: Blood pressure 114/59, pulse 68, temperature 97.6 F (36.4 C), temperature source Oral, resp. rate 19, height 5\' 1"  (1.549 m), weight 97.637 kg (215 lb 4 oz), SpO2 100 %. General appearance: alert, cooperative and no distress Resp: clear to auscultation bilaterally Cardio: regular rate and rhythm, S1, S2 normal, no murmur, click, rub or gallop Incision/Wound:incisions clean, abdomen soft, minimally tender  Disposition: 01-Home or Self Care     Medication List    TAKE these medications        acetaminophen 500 MG tablet  Commonly known as:  TYLENOL  Take 500 mg by mouth every 8 (eight) hours as needed for mild pain, moderate pain, fever or headache.     amLODipine 10 MG tablet  Commonly known as:  NORVASC  Take 10 mg by mouth daily.     aspirin EC 81 MG tablet  Take 81 mg by mouth daily.     atorvastatin 40 MG tablet  Commonly known as:  LIPITOR  Take 40 mg by mouth at bedtime.     docusate sodium 100 MG capsule  Commonly known as:  COLACE  Take 100 mg by mouth daily as needed for mild constipation.     esomeprazole 40 MG capsule  Commonly known as:  NEXIUM  Take 40 mg by mouth daily.     ezetimibe 10 MG tablet  Commonly known as:  ZETIA  Take 10 mg by mouth at bedtime.     furosemide 40 MG tablet  Commonly known as:  LASIX  Take 40 mg by mouth daily.     HYDROcodone-acetaminophen 5-325 MG tablet  Commonly known as:  NORCO/VICODIN  Take 1-2 tablets by mouth every 4  (four) hours as needed for moderate pain.     multivitamins ther. w/minerals Tabs tablet  Take 1 tablet by mouth daily.     ondansetron 4 MG tablet  Commonly known as:  ZOFRAN  Take 1 tablet (4 mg total) by mouth every 6 (six) hours.     ONE TOUCH ULTRA TEST test strip  Generic drug:  glucose blood  1 each by Other route as needed (for blood sugar).     pioglitazone 30 MG tablet  Commonly known as:  ACTOS  Take 30 mg by mouth daily.     valsartan 320 MG tablet  Commonly known as:  DIOVAN  Take 320 mg by mouth daily.     Vitamin D 2000 units Caps  Take 2,000 Units by mouth daily.           Follow-up Information    Follow up with [facility], MD. Schedule an appointment as soon as possible for a visit on 10/18/2015.   Specialty:  General Surgery   Contact information:   [address] [place] [address] [phone]       Signed: Harl Bowie 10/05/2015, 6:54 AM

## 2015-10-05 NOTE — Discharge Instructions (Signed)
[facility] ______[facility] °LAPAROSCOPIC SURGERY: POST OP INSTRUCTIONS °Always review your discharge instruction sheet given to you by the facility where your surgery was performed. °IF YOU HAVE DISABILITY OR FAMILY LEAVE FORMS, YOU MUST BRING THEM TO THE OFFICE FOR PROCESSING.   °DO NOT GIVE THEM TO YOUR DOCTOR. ° °1. A prescription for pain medication may be given to you upon discharge.  Take your pain medication as prescribed, if needed.  If narcotic pain medicine is not needed, then you may take acetaminophen (Tylenol) or ibuprofen (Advil) as needed. °2. Take your usually prescribed medications unless otherwise directed. °3. If you need a refill on your pain medication, please contact your pharmacy.  They will contact our office to request authorization. Prescriptions will not be filled after 5pm or on week-ends. °4. You should follow a light diet the first few days after arrival home, such as soup and crackers, etc.  Be sure to include lots of fluids daily. °5. Most patients will experience some swelling and bruising in the area of the incisions.  Ice packs will help.  Swelling and bruising can take several days to resolve.  °6. It is common to experience some constipation if taking pain medication after surgery.  Increasing fluid intake and taking a stool softener (such as Colace) will usually help or prevent this problem from occurring.  A mild laxative (Milk of Magnesia or Miralax) should be taken according to package instructions if there are no bowel movements after 48 hours. °7. Unless discharge instructions indicate otherwise, you may remove your bandages 24-48 hours after surgery, and you may shower at that time.  You may have steri-strips (small skin tapes) in place directly over the incision.  These strips should be left on the skin for 7-10 days.  If your surgeon used skin glue on the incision, you may shower in 24 hours.  The glue will flake off over the next 2-3 weeks.  Any sutures or  staples will be removed at the office during your follow-up visit. °8. ACTIVITIES:  You may resume regular (light) daily activities beginning the next day--such as daily self-care, walking, climbing stairs--gradually increasing activities as tolerated.  You may have sexual intercourse when it is comfortable.  Refrain from any heavy lifting or straining until approved by your doctor. °a. You may drive when you are no longer taking prescription pain medication, you can comfortably wear a seatbelt, and you can safely maneuver your car and apply brakes. °b. RETURN TO WORK:  __________________________________________________________ °9. You should see your doctor in the office for a follow-up appointment approximately 2-3 weeks after your surgery.  Make sure that you call for this appointment within a day or two after you arrive home to insure a convenient appointment time. °10. OTHER INSTRUCTIONS: __________________________________________________________________________________________________________________________ __________________________________________________________________________________________________________________________ °WHEN TO CALL YOUR DOCTOR: °1. Fever over 101.0 °2. Inability to urinate °3. Continued bleeding from incision. °4. Increased pain, redness, or drainage from the incision. °5. Increasing abdominal pain ° °The clinic staff is available to answer your questions during regular business hours.  Please don’t hesitate to call and ask to speak to one of the nurses for clinical concerns.  If you have a medical emergency, go to the nearest emergency room or call 911.  A surgeon from [facility] is always on call at the hospital. °[address], [address], [place], [place]  [address] ? [address] [address], [place], [place]   [address] °[phone] ? [phone] ? FAX [phone] °Web site:   [email or web address] °

## 2015-10-05 NOTE — Progress Notes (Signed)
Patient ID: [name], female   DOB: [birth year], 80 y.o.   MRN: [id number]  Doing well this morning Pain controlled Abdomen soft  Plan: Discharge home today

## 2015-12-21 ENCOUNTER — Other Ambulatory Visit: Payer: Self-pay | Admitting: Family Medicine

## 2015-12-21 DIAGNOSIS — Z1231 Encounter for screening mammogram for malignant neoplasm of breast: Secondary | ICD-10-CM

## 2016-01-04 ENCOUNTER — Ambulatory Visit
Admission: RE | Admit: 2016-01-04 | Discharge: 2016-01-04 | Disposition: A | Discharge status: Home / Self Care | Payer: 59 | Source: Ambulatory Visit | Attending: Family Medicine | Admitting: Family Medicine

## 2016-01-04 DIAGNOSIS — Z1231 Encounter for screening mammogram for malignant neoplasm of breast: Secondary | ICD-10-CM

## 2016-03-06 ENCOUNTER — Encounter: Payer: Self-pay | Admitting: Cardiology

## 2016-03-15 DIAGNOSIS — E1129 Type 2 diabetes mellitus with other diabetic kidney complication: Secondary | ICD-10-CM | POA: Diagnosis not present

## 2016-03-15 DIAGNOSIS — E559 Vitamin D deficiency, unspecified: Secondary | ICD-10-CM | POA: Diagnosis not present

## 2016-03-15 DIAGNOSIS — N183 Chronic kidney disease, stage 3 (moderate): Secondary | ICD-10-CM | POA: Diagnosis not present

## 2016-03-15 DIAGNOSIS — E7800 Pure hypercholesterolemia, unspecified: Secondary | ICD-10-CM | POA: Diagnosis not present

## 2016-03-15 DIAGNOSIS — Z7984 Long term (current) use of oral hypoglycemic drugs: Secondary | ICD-10-CM | POA: Diagnosis not present

## 2016-03-15 DIAGNOSIS — I10 Essential (primary) hypertension: Secondary | ICD-10-CM | POA: Diagnosis not present

## 2016-03-21 ENCOUNTER — Ambulatory Visit: Payer: 59 | Admitting: Cardiology

## 2016-04-12 ENCOUNTER — Encounter: Payer: Self-pay | Admitting: Cardiology

## 2016-05-08 NOTE — Progress Notes (Signed)
Cardiology Office Note    Date:  05/09/2016   ID:  [name], DOB 1935/09/14, MRN [id number]  PCP:  Gerrit Heck, MD  Cardiologist:  Fransico Him, MD   Chief Complaint  Patient presents with  . Sleep Apnea  . Hypertension    History of Present Illness:  [name] is a 80 y.o. female with a history of OSA, obesity, moderate pulmonary HTN, diastolic dysfunction and HTN who presents today for followup. She uses a nasal pillow mask and tolerates it well.  She feels her pressure is adequate. She has no daytime sleepiness and feels rested in the am. She has no dryness of her mouth but has some congestion in her nose which sometimes makes it difficult to use her nasal mask. She denies any chest pain, SOB or palpitations. Rarely she will have some mild LE edema of her ankles during the day.     Past Medical History:  Diagnosis Date  . Anemia    takes iron  . Arthritis    Dr Ronnie Derby  . Carotid artery stenosis    1-39% bilateral dopplers 08/2015  . Cataracts, bilateral   . Chronic kidney disease    stage III Dr Mercy Moore  . Diabetes mellitus   . GERD (gastroesophageal reflux disease)   . Heart murmur    Dr Irish Lack  . HTN (hypertension)   . Hyperlipidemia   . Morbid obesity with BMI of 40.0-44.9, adult ([place])   . Obstructive sleep apnea (adult) (pediatric)    w CPAP Severe OSA AHI 52/hr CPAP at 10cm H2O  . Pulmonary HTN    mild with PASP 35mmHg echo 02/2014  . Right carotid bruit 08/19/2014  . Shortness of breath    resolved with CPAP  . Urination frequency   . Vitamin D deficiency disease   . Wears dentures    upper  . Wears glasses     Past Surgical History:  Procedure Laterality Date  . ABDOMINAL HYSTERECTOMY     partial  . APPENDECTOMY    . CARDIOVASCULAR STRESS TEST  2011  . CHOLECYSTECTOMY N/A 10/04/2015   Procedure: LAPAROSCOPIC CHOLECYSTECTOMY;  Surgeon: Coralie Keens, MD;  Location: [facility];  Service: General;  Laterality: N/A;  .  COLONOSCOPY  01/15/2012   Procedure: COLONOSCOPY;  Surgeon: Lear Ng, MD;  Location: [facility];  Service: Endoscopy;  Laterality: N/A;  . ESOPHAGOGASTRODUODENOSCOPY  01/15/2012   Procedure: ESOPHAGOGASTRODUODENOSCOPY (EGD);  Surgeon: Lear Ng, MD;  Location: [facility];  Service: Endoscopy;  Laterality: N/A;  . JOINT REPLACEMENT     bilateral  . left shoulder surgery      rotator cuff tear   . sleep study  2012  . TOTAL KNEE ARTHROPLASTY     Left  . TOTAL KNEE ARTHROPLASTY  07/02/2011   Procedure: TOTAL KNEE ARTHROPLASTY;  Surgeon: Rudean Haskell, MD;  Location: [facility];  Service: Orthopedics;  Laterality: Right;  . US ECHOCARDIOGRAPHY     2012    Current Medications: Outpatient Medications Prior to Visit  Medication Sig Dispense Refill  . acetaminophen (TYLENOL) 500 MG tablet Take 500 mg by mouth every 8 (eight) hours as needed for mild pain, moderate pain, fever or headache.     Marland Kitchen amLODipine (NORVASC) 10 MG tablet Take 10 mg by mouth daily.  6  . aspirin EC 81 MG tablet Take 81 mg by mouth daily.    Marland Kitchen atorvastatin (LIPITOR) 40 MG tablet Take 40 mg by mouth at bedtime.    Marland Kitchen  Cholecalciferol (VITAMIN D) 2000 UNITS CAPS Take 2,000 Units by mouth daily.    Marland Kitchen docusate sodium (COLACE) 100 MG capsule Take 100 mg by mouth daily as needed for mild constipation.    Marland Kitchen esomeprazole (NEXIUM) 40 MG capsule Take 40 mg by mouth daily.     Marland Kitchen ezetimibe (ZETIA) 10 MG tablet Take 10 mg by mouth at bedtime.    . furosemide (LASIX) 40 MG tablet Take 40 mg by mouth daily.     . Multiple Vitamins-Minerals (MULTIVITAMINS THER. W/MINERALS) TABS Take 1 tablet by mouth daily.    . pioglitazone (ACTOS) 30 MG tablet Take 30 mg by mouth daily.    . valsartan (DIOVAN) 320 MG tablet Take 320 mg by mouth daily.    Marland Kitchen HYDROcodone-acetaminophen (NORCO/VICODIN) 5-325 MG tablet Take 1-2 tablets by mouth every 4 (four) hours as needed for moderate pain. 30 tablet 0  . ondansetron (ZOFRAN) 4 MG  tablet Take 1 tablet (4 mg total) by mouth every 6 (six) hours. (Patient taking differently: Take 4 mg by mouth every 6 (six) hours as needed for nausea or vomiting. ) 12 tablet 0  . ONE TOUCH ULTRA TEST test strip 1 each by Other route as needed (for blood sugar).      No facility-administered medications prior to visit.      Allergies:   Codeine and Ibuprofen   Social History   Social History  . Marital status: Widowed    Spouse name: N/A  . Number of children: N/A  . Years of education: N/A   Social History Main Topics  . Smoking status: Never Smoker  . Smokeless tobacco: Never Used  . Alcohol use No  . Drug use: No  . Sexual activity: Not on file   Other Topics Concern  . Not on file   Social History Narrative  . No narrative on file     Family History:  The patient's family history includes Diabetes in her father and mother.   ROS:   Please see the history of present illness.    ROS All other systems reviewed and are negative.  No flowsheet data found.     PHYSICAL EXAM:   VS:  BP (!) 150/78   Pulse 64   Ht 5' 1.5" (1.562 m)   Wt 217 lb 12.8 oz (98.8 kg)   BMI 40.49 kg/m    GEN: Well nourished, well developed, in no acute distress  HEENT: normal  Neck: no JVD, carotid bruits, or masses Cardiac: RRR; no murmurs, rubs, or gallops,no edema.  Intact distal pulses bilaterally.  Respiratory:  clear to auscultation bilaterally, normal work of breathing GI: soft, nontender, nondistended, + BS MS: no deformity or atrophy  Skin: warm and dry, no rash Neuro:  Alert and Oriented x 3, Strength and sensation are intact Psych: euthymic mood, full affect  Wt Readings from Last 3 Encounters:  05/09/16 217 lb 12.8 oz (98.8 kg)  10/04/15 215 lb 4 oz (97.6 kg)  03/07/15 225 lb 3.2 oz (102.2 kg)      Studies/Labs Reviewed:   EKG:  EKG is not ordered today.    Recent Labs: 09/14/2015: ALT 13; BUN 21; Creatinine, Ser 1.32; Hemoglobin 10.7; Platelets 260; Potassium  3.9; Sodium 141   Lipid Panel No results found for: CHOL, TRIG, HDL, CHOLHDL, VLDL, LDLCALC, LDLDIRECT  Additional studies/ records that were reviewed today include:  none    ASSESSMENT:    1. Pulmonary HTN   2. Essential hypertension  3. Bilateral carotid artery stenosis   4. Obstructive sleep apnea   5. Morbid obesity with BMI of 40.0-44.9, adult ([place])      PLAN:  In order of problems listed above:  1.  Moderate pulmonary HTN with PASP 16mmHg - repeat echo to make sure this has not progressed.  Most likely related to underlying OSA.   2.  HTN - BP controlled on current meds. Continue amlodipine and ARB.  3.  Mild bilateral carotid artery stenosis (1-39%) by dopplers this year- continue ASA/statin.   4.  OSA - the patient is tolerating PAP therapy well without any problems.  The patient has been using and benefiting from CPAP use and will continue to benefit from therapy. I will get a download.  I will order her a Resmed Airfit F20 mask to use when she has sinus congestion. 5.   Morbid Obesity - I have encouraged her to get into a routine exercise program and cut back on carbs and portions.     Medication Adjustments/Labs and Tests Ordered: Current medicines are reviewed at length with the patient today.  Concerns regarding medicines are outlined above.  Medication changes, Labs and Tests ordered today are listed in the Patient Instructions below.  There are no Patient Instructions on file for this visit.   Signed, Fransico Him, MD  05/09/2016 8:32 AM    [facility] [place], [place], [place]  [address] Phone: [phone]; Fax: [phone]

## 2016-05-09 ENCOUNTER — Ambulatory Visit (INDEPENDENT_AMBULATORY_CARE_PROVIDER_SITE_OTHER): Payer: 59 | Admitting: Cardiology

## 2016-05-09 VITALS — BP 150/78 | HR 64 | Ht 61.5 in | Wt 217.8 lb

## 2016-05-09 DIAGNOSIS — I10 Essential (primary) hypertension: Secondary | ICD-10-CM

## 2016-05-09 DIAGNOSIS — I6523 Occlusion and stenosis of bilateral carotid arteries: Secondary | ICD-10-CM

## 2016-05-09 DIAGNOSIS — G4733 Obstructive sleep apnea (adult) (pediatric): Secondary | ICD-10-CM | POA: Diagnosis not present

## 2016-05-09 DIAGNOSIS — I272 Pulmonary hypertension, unspecified: Secondary | ICD-10-CM

## 2016-05-09 DIAGNOSIS — Z6841 Body Mass Index (BMI) 40.0 and over, adult: Secondary | ICD-10-CM

## 2016-05-09 NOTE — Patient Instructions (Signed)

## 2016-05-14 ENCOUNTER — Other Ambulatory Visit: Payer: Self-pay | Admitting: *Deleted

## 2016-05-14 ENCOUNTER — Telehealth: Payer: Self-pay | Admitting: Cardiology

## 2016-05-14 DIAGNOSIS — G4733 Obstructive sleep apnea (adult) (pediatric): Secondary | ICD-10-CM

## 2016-05-14 NOTE — Telephone Encounter (Signed)
Received SIM card (patient had OV last week and dropped off the card to be read).  Completed download and printed for Dr. Radford Pax to review. Gave card to CPAP assistant to arrange pick-up with patient.

## 2016-05-14 NOTE — Telephone Encounter (Signed)
New message  Pt is returning call, can pick up memory stick on Thursday if that is what the call was about   Please call back, can leave message/pt is at work

## 2016-05-14 NOTE — Telephone Encounter (Signed)
Called and left message to call back.

## 2016-05-16 ENCOUNTER — Telehealth: Payer: Self-pay | Admitting: Cardiology

## 2016-05-16 NOTE — Telephone Encounter (Signed)
Left message for patient that sleep chip has been placed at the front desk for pick-up. Instructed her to call with any questions or concerns.

## 2016-05-16 NOTE — Telephone Encounter (Signed)
New message  Pt is calling concerning the memory chip for her Cpap machine  Pt states that we have the memory chip and pt is leaving to go out of town tomorrow for 2 weeks  Gae Bon is off today  Please call pt back and advise

## 2016-05-17 NOTE — Telephone Encounter (Signed)
Spoke to the patient today and she already came to pick up her chip

## 2016-05-22 ENCOUNTER — Telehealth: Payer: Self-pay | Admitting: *Deleted

## 2016-05-22 NOTE — Telephone Encounter (Signed)
Left message to return my call.  

## 2016-05-22 NOTE — Telephone Encounter (Signed)
-----   Message from Sueanne Margarita, MD sent at 05/21/2016 11:29 PM EST ----- Good AHI on CPAP but needs to improve compliance

## 2016-05-29 DIAGNOSIS — R42 Dizziness and giddiness: Secondary | ICD-10-CM | POA: Diagnosis not present

## 2016-05-29 DIAGNOSIS — R110 Nausea: Secondary | ICD-10-CM | POA: Diagnosis not present

## 2016-06-05 DIAGNOSIS — M9902 Segmental and somatic dysfunction of thoracic region: Secondary | ICD-10-CM | POA: Diagnosis not present

## 2016-06-05 DIAGNOSIS — M9904 Segmental and somatic dysfunction of sacral region: Secondary | ICD-10-CM | POA: Diagnosis not present

## 2016-06-05 DIAGNOSIS — M9903 Segmental and somatic dysfunction of lumbar region: Secondary | ICD-10-CM | POA: Diagnosis not present

## 2016-06-05 DIAGNOSIS — M5136 Other intervertebral disc degeneration, lumbar region: Secondary | ICD-10-CM | POA: Diagnosis not present

## 2016-06-07 DIAGNOSIS — M9903 Segmental and somatic dysfunction of lumbar region: Secondary | ICD-10-CM | POA: Diagnosis not present

## 2016-06-07 DIAGNOSIS — M9904 Segmental and somatic dysfunction of sacral region: Secondary | ICD-10-CM | POA: Diagnosis not present

## 2016-06-07 DIAGNOSIS — M5136 Other intervertebral disc degeneration, lumbar region: Secondary | ICD-10-CM | POA: Diagnosis not present

## 2016-06-07 DIAGNOSIS — M9902 Segmental and somatic dysfunction of thoracic region: Secondary | ICD-10-CM | POA: Diagnosis not present

## 2016-06-11 ENCOUNTER — Telehealth (HOSPITAL_COMMUNITY): Payer: Self-pay | Admitting: Radiology

## 2016-06-11 NOTE — Telephone Encounter (Signed)
Left message regarding office closure

## 2016-06-11 NOTE — Telephone Encounter (Signed)
Left message regarding office close and we will call to reschedule

## 2016-06-12 ENCOUNTER — Other Ambulatory Visit (HOSPITAL_COMMUNITY): Payer: 59

## 2016-06-14 DIAGNOSIS — M9902 Segmental and somatic dysfunction of thoracic region: Secondary | ICD-10-CM | POA: Diagnosis not present

## 2016-06-14 DIAGNOSIS — M9904 Segmental and somatic dysfunction of sacral region: Secondary | ICD-10-CM | POA: Diagnosis not present

## 2016-06-14 DIAGNOSIS — M9903 Segmental and somatic dysfunction of lumbar region: Secondary | ICD-10-CM | POA: Diagnosis not present

## 2016-06-14 DIAGNOSIS — M5136 Other intervertebral disc degeneration, lumbar region: Secondary | ICD-10-CM | POA: Diagnosis not present

## 2016-06-18 DIAGNOSIS — R1011 Right upper quadrant pain: Secondary | ICD-10-CM | POA: Diagnosis not present

## 2016-06-18 DIAGNOSIS — R1084 Generalized abdominal pain: Secondary | ICD-10-CM | POA: Diagnosis not present

## 2016-06-22 DIAGNOSIS — M9901 Segmental and somatic dysfunction of cervical region: Secondary | ICD-10-CM | POA: Diagnosis not present

## 2016-06-22 DIAGNOSIS — M5134 Other intervertebral disc degeneration, thoracic region: Secondary | ICD-10-CM | POA: Diagnosis not present

## 2016-06-22 DIAGNOSIS — M9902 Segmental and somatic dysfunction of thoracic region: Secondary | ICD-10-CM | POA: Diagnosis not present

## 2016-06-22 DIAGNOSIS — M50322 Other cervical disc degeneration at C5-C6 level: Secondary | ICD-10-CM | POA: Diagnosis not present

## 2016-06-23 DIAGNOSIS — M542 Cervicalgia: Secondary | ICD-10-CM | POA: Diagnosis not present

## 2016-06-25 DIAGNOSIS — M9902 Segmental and somatic dysfunction of thoracic region: Secondary | ICD-10-CM | POA: Diagnosis not present

## 2016-06-25 DIAGNOSIS — S46819A Strain of other muscles, fascia and tendons at shoulder and upper arm level, unspecified arm, initial encounter: Secondary | ICD-10-CM | POA: Diagnosis not present

## 2016-06-25 DIAGNOSIS — M5134 Other intervertebral disc degeneration, thoracic region: Secondary | ICD-10-CM | POA: Diagnosis not present

## 2016-06-25 DIAGNOSIS — M50322 Other cervical disc degeneration at C5-C6 level: Secondary | ICD-10-CM | POA: Diagnosis not present

## 2016-06-25 DIAGNOSIS — M9901 Segmental and somatic dysfunction of cervical region: Secondary | ICD-10-CM | POA: Diagnosis not present

## 2016-07-03 DIAGNOSIS — M9902 Segmental and somatic dysfunction of thoracic region: Secondary | ICD-10-CM | POA: Diagnosis not present

## 2016-07-03 DIAGNOSIS — M5134 Other intervertebral disc degeneration, thoracic region: Secondary | ICD-10-CM | POA: Diagnosis not present

## 2016-07-03 DIAGNOSIS — Z6839 Body mass index (BMI) 39.0-39.9, adult: Secondary | ICD-10-CM | POA: Diagnosis not present

## 2016-07-03 DIAGNOSIS — E1129 Type 2 diabetes mellitus with other diabetic kidney complication: Secondary | ICD-10-CM | POA: Diagnosis not present

## 2016-07-03 DIAGNOSIS — I129 Hypertensive chronic kidney disease with stage 1 through stage 4 chronic kidney disease, or unspecified chronic kidney disease: Secondary | ICD-10-CM | POA: Diagnosis not present

## 2016-07-03 DIAGNOSIS — M9901 Segmental and somatic dysfunction of cervical region: Secondary | ICD-10-CM | POA: Diagnosis not present

## 2016-07-03 DIAGNOSIS — S46819A Strain of other muscles, fascia and tendons at shoulder and upper arm level, unspecified arm, initial encounter: Secondary | ICD-10-CM | POA: Diagnosis not present

## 2016-07-03 DIAGNOSIS — M50322 Other cervical disc degeneration at C5-C6 level: Secondary | ICD-10-CM | POA: Diagnosis not present

## 2016-07-03 DIAGNOSIS — N183 Chronic kidney disease, stage 3 (moderate): Secondary | ICD-10-CM | POA: Diagnosis not present

## 2016-07-03 DIAGNOSIS — H6590 Unspecified nonsuppurative otitis media, unspecified ear: Secondary | ICD-10-CM | POA: Diagnosis not present

## 2016-07-06 ENCOUNTER — Telehealth: Payer: Self-pay | Admitting: *Deleted

## 2016-07-06 NOTE — Telephone Encounter (Signed)
Called and left message for patient to return my call.

## 2016-07-16 ENCOUNTER — Telehealth: Payer: Self-pay | Admitting: *Deleted

## 2016-07-16 NOTE — Telephone Encounter (Signed)
-----   Message from Sueanne Margarita, MD sent at 07/13/2016  9:00 PM EST ----- Good AHI and compliance.  Continue current CPAP settings.

## 2016-07-16 NOTE — Telephone Encounter (Signed)
Called and left a message on vm to return my call

## 2016-07-19 ENCOUNTER — Telehealth: Payer: Self-pay | Admitting: *Deleted

## 2016-07-19 NOTE — Telephone Encounter (Signed)
Called the patient and gave her the results, she verbalized understanding and stated she has not wore her mask in about 3 weeks because her neck/head has been hurting like she had whiplash but doctor gave her muscle  relaxers and she is getting better and she will resume wearing her mask this weekend. 07/19/2016

## 2016-07-19 NOTE — Telephone Encounter (Signed)
-----   Message from Sueanne Margarita, MD sent at 07/13/2016  9:00 PM EST ----- Good AHI and compliance.  Continue current CPAP settings.

## 2016-07-24 ENCOUNTER — Other Ambulatory Visit: Payer: Self-pay | Admitting: Orthopedic Surgery

## 2016-07-24 DIAGNOSIS — M542 Cervicalgia: Secondary | ICD-10-CM

## 2016-07-29 ENCOUNTER — Ambulatory Visit
Admission: RE | Admit: 2016-07-29 | Discharge: 2016-07-29 | Disposition: A | Discharge status: Home / Self Care | Payer: 59 | Source: Ambulatory Visit | Attending: Orthopedic Surgery | Admitting: Orthopedic Surgery

## 2016-07-29 DIAGNOSIS — M542 Cervicalgia: Secondary | ICD-10-CM

## 2016-07-29 DIAGNOSIS — M4802 Spinal stenosis, cervical region: Secondary | ICD-10-CM | POA: Diagnosis not present

## 2016-07-31 DIAGNOSIS — M9905 Segmental and somatic dysfunction of pelvic region: Secondary | ICD-10-CM | POA: Diagnosis not present

## 2016-07-31 DIAGNOSIS — M5030 Other cervical disc degeneration, unspecified cervical region: Secondary | ICD-10-CM | POA: Diagnosis not present

## 2016-07-31 DIAGNOSIS — M542 Cervicalgia: Secondary | ICD-10-CM | POA: Diagnosis not present

## 2016-07-31 DIAGNOSIS — M9904 Segmental and somatic dysfunction of sacral region: Secondary | ICD-10-CM | POA: Diagnosis not present

## 2016-07-31 DIAGNOSIS — M5136 Other intervertebral disc degeneration, lumbar region: Secondary | ICD-10-CM | POA: Diagnosis not present

## 2016-07-31 DIAGNOSIS — M9903 Segmental and somatic dysfunction of lumbar region: Secondary | ICD-10-CM | POA: Diagnosis not present

## 2016-07-31 DIAGNOSIS — M5020 Other cervical disc displacement, unspecified cervical region: Secondary | ICD-10-CM | POA: Diagnosis not present

## 2016-08-02 DIAGNOSIS — M9905 Segmental and somatic dysfunction of pelvic region: Secondary | ICD-10-CM | POA: Diagnosis not present

## 2016-08-02 DIAGNOSIS — M5136 Other intervertebral disc degeneration, lumbar region: Secondary | ICD-10-CM | POA: Diagnosis not present

## 2016-08-02 DIAGNOSIS — M9903 Segmental and somatic dysfunction of lumbar region: Secondary | ICD-10-CM | POA: Diagnosis not present

## 2016-08-02 DIAGNOSIS — M9904 Segmental and somatic dysfunction of sacral region: Secondary | ICD-10-CM | POA: Diagnosis not present

## 2016-08-09 DIAGNOSIS — M542 Cervicalgia: Secondary | ICD-10-CM | POA: Diagnosis not present

## 2016-08-09 DIAGNOSIS — Z6841 Body Mass Index (BMI) 40.0 and over, adult: Secondary | ICD-10-CM | POA: Diagnosis not present

## 2016-08-09 DIAGNOSIS — M47812 Spondylosis without myelopathy or radiculopathy, cervical region: Secondary | ICD-10-CM | POA: Diagnosis not present

## 2016-08-09 DIAGNOSIS — I10 Essential (primary) hypertension: Secondary | ICD-10-CM | POA: Diagnosis not present

## 2016-08-14 DIAGNOSIS — M542 Cervicalgia: Secondary | ICD-10-CM | POA: Diagnosis not present

## 2016-08-14 DIAGNOSIS — M436 Torticollis: Secondary | ICD-10-CM | POA: Diagnosis not present

## 2016-08-14 DIAGNOSIS — M5030 Other cervical disc degeneration, unspecified cervical region: Secondary | ICD-10-CM | POA: Diagnosis not present

## 2016-08-16 DIAGNOSIS — H2513 Age-related nuclear cataract, bilateral: Secondary | ICD-10-CM | POA: Diagnosis not present

## 2016-08-16 DIAGNOSIS — H524 Presbyopia: Secondary | ICD-10-CM | POA: Diagnosis not present

## 2016-08-16 DIAGNOSIS — H04123 Dry eye syndrome of bilateral lacrimal glands: Secondary | ICD-10-CM | POA: Diagnosis not present

## 2016-08-16 DIAGNOSIS — E119 Type 2 diabetes mellitus without complications: Secondary | ICD-10-CM | POA: Diagnosis not present

## 2016-08-28 DIAGNOSIS — M5030 Other cervical disc degeneration, unspecified cervical region: Secondary | ICD-10-CM | POA: Diagnosis not present

## 2016-08-28 DIAGNOSIS — M436 Torticollis: Secondary | ICD-10-CM | POA: Diagnosis not present

## 2016-08-28 DIAGNOSIS — M542 Cervicalgia: Secondary | ICD-10-CM | POA: Diagnosis not present

## 2016-09-07 DIAGNOSIS — M436 Torticollis: Secondary | ICD-10-CM | POA: Diagnosis not present

## 2016-09-07 DIAGNOSIS — M542 Cervicalgia: Secondary | ICD-10-CM | POA: Diagnosis not present

## 2016-09-07 DIAGNOSIS — M5030 Other cervical disc degeneration, unspecified cervical region: Secondary | ICD-10-CM | POA: Diagnosis not present

## 2016-09-12 DIAGNOSIS — M436 Torticollis: Secondary | ICD-10-CM | POA: Diagnosis not present

## 2016-09-12 DIAGNOSIS — M542 Cervicalgia: Secondary | ICD-10-CM | POA: Diagnosis not present

## 2016-09-12 DIAGNOSIS — M5030 Other cervical disc degeneration, unspecified cervical region: Secondary | ICD-10-CM | POA: Diagnosis not present

## 2016-09-17 DIAGNOSIS — R011 Cardiac murmur, unspecified: Secondary | ICD-10-CM | POA: Diagnosis not present

## 2016-09-17 DIAGNOSIS — E1122 Type 2 diabetes mellitus with diabetic chronic kidney disease: Secondary | ICD-10-CM | POA: Diagnosis not present

## 2016-09-17 DIAGNOSIS — R635 Abnormal weight gain: Secondary | ICD-10-CM | POA: Diagnosis not present

## 2016-09-17 DIAGNOSIS — Z7984 Long term (current) use of oral hypoglycemic drugs: Secondary | ICD-10-CM | POA: Diagnosis not present

## 2016-09-17 DIAGNOSIS — E559 Vitamin D deficiency, unspecified: Secondary | ICD-10-CM | POA: Diagnosis not present

## 2016-09-17 DIAGNOSIS — D509 Iron deficiency anemia, unspecified: Secondary | ICD-10-CM | POA: Diagnosis not present

## 2016-09-17 DIAGNOSIS — N183 Chronic kidney disease, stage 3 (moderate): Secondary | ICD-10-CM | POA: Diagnosis not present

## 2016-09-17 DIAGNOSIS — E1129 Type 2 diabetes mellitus with other diabetic kidney complication: Secondary | ICD-10-CM | POA: Diagnosis not present

## 2016-09-17 DIAGNOSIS — I10 Essential (primary) hypertension: Secondary | ICD-10-CM | POA: Diagnosis not present

## 2016-09-17 DIAGNOSIS — E7800 Pure hypercholesterolemia, unspecified: Secondary | ICD-10-CM | POA: Diagnosis not present

## 2016-09-20 ENCOUNTER — Other Ambulatory Visit: Payer: Self-pay

## 2016-09-20 ENCOUNTER — Ambulatory Visit (HOSPITAL_COMMUNITY): Payer: 59 | Attending: Interventional Cardiology

## 2016-09-20 DIAGNOSIS — I081 Rheumatic disorders of both mitral and tricuspid valves: Secondary | ICD-10-CM | POA: Diagnosis not present

## 2016-09-20 DIAGNOSIS — I272 Pulmonary hypertension, unspecified: Secondary | ICD-10-CM | POA: Diagnosis not present

## 2016-09-25 ENCOUNTER — Telehealth (HOSPITAL_COMMUNITY): Payer: Self-pay | Admitting: Cardiology

## 2016-09-25 ENCOUNTER — Telehealth: Payer: Self-pay | Admitting: Cardiology

## 2016-09-25 NOTE — Telephone Encounter (Signed)
I called and left VM for patient to call back.  Need to schedule pt for new pt pulm htn appt with Dr. Aundra Dubin.

## 2016-09-25 NOTE — Telephone Encounter (Signed)
New message    [phone]<< try work number 1st  Pt is returning your call , leave results on her cellphone voicemail, if there is a problem she will call you back

## 2016-09-25 NOTE — Telephone Encounter (Signed)
Per patient request, left detailed message with results and informed her Dr. Claris Gladden office will call with appointment time. Instructed her to call with further questions or concerns.

## 2016-09-25 NOTE — Telephone Encounter (Signed)
-----   Message from Sueanne Margarita, MD sent at 09/20/2016  2:15 PM EDT ----- Echo showed normal LVF with increased stiffness of heart muscle, mild MR, moderate TR and moderate pulmonary HTN.  Compared to prior echo, PASP as increased from 22mmHg to 56mmHg.  Please refer to Dr. Aundra Dubin for further workup of pulmnary HTN which is likely secondary to OSA.

## 2016-09-26 NOTE — Telephone Encounter (Signed)
appt sch 5/22

## 2016-10-09 DIAGNOSIS — M9905 Segmental and somatic dysfunction of pelvic region: Secondary | ICD-10-CM | POA: Diagnosis not present

## 2016-10-09 DIAGNOSIS — M5136 Other intervertebral disc degeneration, lumbar region: Secondary | ICD-10-CM | POA: Diagnosis not present

## 2016-10-09 DIAGNOSIS — M9904 Segmental and somatic dysfunction of sacral region: Secondary | ICD-10-CM | POA: Diagnosis not present

## 2016-10-09 DIAGNOSIS — M9903 Segmental and somatic dysfunction of lumbar region: Secondary | ICD-10-CM | POA: Diagnosis not present

## 2016-10-15 DIAGNOSIS — M5136 Other intervertebral disc degeneration, lumbar region: Secondary | ICD-10-CM | POA: Diagnosis not present

## 2016-10-15 DIAGNOSIS — M9903 Segmental and somatic dysfunction of lumbar region: Secondary | ICD-10-CM | POA: Diagnosis not present

## 2016-10-15 DIAGNOSIS — M9905 Segmental and somatic dysfunction of pelvic region: Secondary | ICD-10-CM | POA: Diagnosis not present

## 2016-10-15 DIAGNOSIS — M9904 Segmental and somatic dysfunction of sacral region: Secondary | ICD-10-CM | POA: Diagnosis not present

## 2016-10-18 DIAGNOSIS — M9905 Segmental and somatic dysfunction of pelvic region: Secondary | ICD-10-CM | POA: Diagnosis not present

## 2016-10-18 DIAGNOSIS — M9904 Segmental and somatic dysfunction of sacral region: Secondary | ICD-10-CM | POA: Diagnosis not present

## 2016-10-18 DIAGNOSIS — M9903 Segmental and somatic dysfunction of lumbar region: Secondary | ICD-10-CM | POA: Diagnosis not present

## 2016-10-18 DIAGNOSIS — M5136 Other intervertebral disc degeneration, lumbar region: Secondary | ICD-10-CM | POA: Diagnosis not present

## 2016-10-19 DIAGNOSIS — M436 Torticollis: Secondary | ICD-10-CM | POA: Diagnosis not present

## 2016-10-19 DIAGNOSIS — M542 Cervicalgia: Secondary | ICD-10-CM | POA: Diagnosis not present

## 2016-10-19 DIAGNOSIS — M5030 Other cervical disc degeneration, unspecified cervical region: Secondary | ICD-10-CM | POA: Diagnosis not present

## 2016-10-23 ENCOUNTER — Encounter (HOSPITAL_COMMUNITY): Payer: Self-pay | Admitting: Internal Medicine

## 2016-10-23 ENCOUNTER — Ambulatory Visit (HOSPITAL_COMMUNITY)
Admission: RE | Admit: 2016-10-23 | Discharge: 2016-10-23 | Disposition: A | Discharge status: Home / Self Care | Payer: 59 | Source: Ambulatory Visit | Attending: Internal Medicine | Admitting: Internal Medicine

## 2016-10-23 VITALS — BP 133/60 | HR 67 | Wt 214.5 lb

## 2016-10-23 DIAGNOSIS — D649 Anemia, unspecified: Secondary | ICD-10-CM | POA: Diagnosis not present

## 2016-10-23 DIAGNOSIS — E559 Vitamin D deficiency, unspecified: Secondary | ICD-10-CM | POA: Diagnosis not present

## 2016-10-23 DIAGNOSIS — Z885 Allergy status to narcotic agent status: Secondary | ICD-10-CM | POA: Diagnosis not present

## 2016-10-23 DIAGNOSIS — Z6839 Body mass index (BMI) 39.0-39.9, adult: Secondary | ICD-10-CM | POA: Insufficient documentation

## 2016-10-23 DIAGNOSIS — E669 Obesity, unspecified: Secondary | ICD-10-CM | POA: Insufficient documentation

## 2016-10-23 DIAGNOSIS — I5032 Chronic diastolic (congestive) heart failure: Secondary | ICD-10-CM | POA: Diagnosis not present

## 2016-10-23 DIAGNOSIS — Z833 Family history of diabetes mellitus: Secondary | ICD-10-CM | POA: Insufficient documentation

## 2016-10-23 DIAGNOSIS — G4733 Obstructive sleep apnea (adult) (pediatric): Secondary | ICD-10-CM | POA: Diagnosis not present

## 2016-10-23 DIAGNOSIS — K219 Gastro-esophageal reflux disease without esophagitis: Secondary | ICD-10-CM | POA: Diagnosis not present

## 2016-10-23 DIAGNOSIS — I130 Hypertensive heart and chronic kidney disease with heart failure and stage 1 through stage 4 chronic kidney disease, or unspecified chronic kidney disease: Secondary | ICD-10-CM | POA: Diagnosis not present

## 2016-10-23 DIAGNOSIS — Z7982 Long term (current) use of aspirin: Secondary | ICD-10-CM | POA: Insufficient documentation

## 2016-10-23 DIAGNOSIS — Z7984 Long term (current) use of oral hypoglycemic drugs: Secondary | ICD-10-CM | POA: Diagnosis not present

## 2016-10-23 DIAGNOSIS — Z888 Allergy status to other drugs, medicaments and biological substances status: Secondary | ICD-10-CM | POA: Insufficient documentation

## 2016-10-23 DIAGNOSIS — I272 Pulmonary hypertension, unspecified: Secondary | ICD-10-CM | POA: Diagnosis not present

## 2016-10-23 DIAGNOSIS — E1122 Type 2 diabetes mellitus with diabetic chronic kidney disease: Secondary | ICD-10-CM | POA: Diagnosis not present

## 2016-10-23 DIAGNOSIS — Z79899 Other long term (current) drug therapy: Secondary | ICD-10-CM | POA: Insufficient documentation

## 2016-10-23 DIAGNOSIS — M1990 Unspecified osteoarthritis, unspecified site: Secondary | ICD-10-CM | POA: Insufficient documentation

## 2016-10-23 DIAGNOSIS — N183 Chronic kidney disease, stage 3 (moderate): Secondary | ICD-10-CM | POA: Insufficient documentation

## 2016-10-23 DIAGNOSIS — E785 Hyperlipidemia, unspecified: Secondary | ICD-10-CM | POA: Diagnosis not present

## 2016-10-23 MED ORDER — SPIRONOLACTONE 25 MG PO TABS: 12.5000 mg | ORAL_TABLET | Freq: Every day | ORAL | 3 refills | Status: DC

## 2016-10-23 NOTE — Patient Instructions (Signed)
Start Spironolactone 12.5 mg (1/2 tab) daily  Your physician has recommended that you have a pulmonary function test. Pulmonary Function Tests are a group of tests that measure how well air moves in and out of your lungs.  You have been referred to [facility] for home oxygen, they will contact you before bringing the equipment  You have been referred to Pulmonary Rehab, they will contact you to schedule  Labs in 1 week at Dr. Benjamine Mola [place] office  Your physician recommends that you schedule a follow-up appointment in: 2 months

## 2016-10-23 NOTE — Progress Notes (Signed)
SATURATION QUALIFICATIONS: (This note is used to comply with regulatory documentation for home oxygen)  Patient Saturations on Room Air at Rest = 100%  Patient Saturations on Room Air while Ambulating = 84%  Patient Saturations on 2 Liters of oxygen while Ambulating = 92%  Please briefly explain why patient needs home oxygen: hypoxia, pulmonary hypertension

## 2016-10-23 NOTE — Progress Notes (Signed)
ADVANCED HF CLINIC CONSULT NOTE  Referring Physician: Primary Care: Primary Cardiologist: Radford Pax  HPI:  [name] is a 81 y.o. female with a history of HTN, DM2, CKD, diastolic HF, OSA, obesity and moderate pulmonary HTN referred by Dr. Radford Pax for further evaluation of worsening pulmonary HTN on echo.    She denies any h/o known CAD or other heart disease. Says sh does have a heart murmur. Has been a non-smoker. She does have HTN and OSA. Says SBP typically in 120-130s. She uses her CPAP religiously. She has no daytime sleepiness and feels rested in the am.Occasional LE edema but managed well with lasix.  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 Mild DOE. Non-smoker. No h/o DVT/PE. No syncope/presyncope.    Had repeat echo (reviewed personally) 09/20/16 LVEF 19-41% Grade II diastolic dysfunction RV ok RVSP 59    Review of Systems: [y] = yes, [ ]  = no   General: Weight gain [ ] ; Weight loss [ ] ; Anorexia [ ] ; Fatigue [ ] ; Fever [ ] ; Chills [ ] ; Weakness [ ]   Cardiac: Chest pain/pressure [ ] ; Resting SOB [ ] ; Exertional SOB [ y]; Orthopnea [ ] ; Pedal Edema [ ] ; Palpitations [ ] ; Syncope [ ] ; Presyncope [ ] ; Paroxysmal nocturnal dyspnea[ ]   Pulmonary: Cough [ ] ; Wheezing[ ] ; Hemoptysis[ ] ; Sputum [ ] ; Snoring [ ]   GI: Vomiting[ ] ; Dysphagia[ ] ; Melena[ ] ; Hematochezia [ ] ; Heartburn[ ] ; Abdominal pain [ ] ; Constipation [ ] ; Diarrhea [ ] ; BRBPR [ ]   GU: Hematuria[ ] ; Dysuria [ ] ; Nocturia[ ]   Vascular: Pain in legs with walking [ ] ; Pain in feet with lying flat [ ] ; Non-healing sores [ ] ; Stroke [ ] ; TIA [ ] ; Slurred speech [ ] ;  Neuro: Headaches[ ] ; Vertigo[ ] ; Seizures[ ] ; Paresthesias[ ] ;Blurred vision [ ] ; Diplopia [ ] ; Vision changes [ ]   Ortho/Skin: Arthritis [ y]; Joint pain Blue.Reese ]; Muscle pain [ ] ; Joint swelling [ ] ; Back Pain [ ] ; Rash [ ]   Psych: Depression[ ] ; Anxiety[ ]   Heme: Bleeding problems [ ] ; Clotting disorders [ ] ; Anemia [ ]   Endocrine: Diabetes Blue.Reese  ]; Thyroid dysfunction[ ]    Past Medical History:  Diagnosis Date  . Anemia    takes iron  . Arthritis    Dr Ronnie Derby  . Carotid artery stenosis    1-39% bilateral dopplers 08/2015  . Cataracts, bilateral   . Chronic kidney disease    stage III Dr Mercy Moore  . Diabetes mellitus   . GERD (gastroesophageal reflux disease)   . Heart murmur    Dr Irish Lack  . HTN (hypertension)   . Hyperlipidemia   . Morbid obesity with BMI of 40.0-44.9, adult ([place])   . Obstructive sleep apnea (adult) (pediatric)    w CPAP Severe OSA AHI 52/hr CPAP at 10cm H2O  . Pulmonary HTN ([place])    mild with PASP 62mmHg echo 02/2014  . Right carotid bruit 08/19/2014  . Shortness of breath    resolved with CPAP  . Urination frequency   . Vitamin D deficiency disease   . Wears dentures    upper  . Wears glasses     Current Outpatient Prescriptions  Medication Sig Dispense Refill  . acetaminophen (TYLENOL) 500 MG tablet Take 500 mg by mouth every 8 (eight) hours as needed for mild pain, moderate pain, fever or headache.     Marland Kitchen amLODipine (NORVASC) 10 MG tablet Take 10 mg by mouth daily.  6  . aspirin EC 81 MG tablet Take 81 mg by mouth daily.    Marland Kitchen atorvastatin (LIPITOR) 40 MG tablet Take 40 mg by mouth at bedtime.    . Cholecalciferol (VITAMIN D) 2000 UNITS CAPS Take 2,000 Units by mouth daily.    Marland Kitchen esomeprazole (NEXIUM) 40 MG capsule Take 40 mg by mouth daily.     Marland Kitchen ezetimibe (ZETIA) 10 MG tablet Take 10 mg by mouth at bedtime.    . furosemide (LASIX) 40 MG tablet Take 40 mg by mouth daily.     . Multiple Vitamins-Minerals (MULTIVITAMINS THER. W/MINERALS) TABS Take 1 tablet by mouth daily.    . pioglitazone (ACTOS) 30 MG tablet Take 30 mg by mouth daily.    . valsartan (DIOVAN) 320 MG tablet Take 320 mg by mouth daily.    Marland Kitchen docusate sodium (COLACE) 100 MG capsule Take 100 mg by mouth daily as needed for mild constipation.     No current facility-administered medications for this encounter.     Allergies    Allergen Reactions  . Codeine Itching  . Ibuprofen Other (See Comments)    "gave her kidney trouble"      Social History   Social History  . Marital status: Widowed    Spouse name: N/A  . Number of children: N/A  . Years of education: N/A   Occupational History  . Not on file.   Social History Main Topics  . Smoking status: Never Smoker  . Smokeless tobacco: Never Used  . Alcohol use No  . Drug use: No  . Sexual activity: Not on file   Other Topics Concern  . Not on file   Social History Narrative  . No narrative on file      Family History  Problem Relation Age of Onset  . Diabetes Mother   . Diabetes Father     Vitals:   10/23/16 1109  BP: 133/60  Pulse: 67  SpO2: 100%  Weight: 214 lb 8 oz (97.3 kg)    Hall walk done personally with desat down to 84% - increased to 92% with 2L O2  PHYSICAL EXAM: General:  Elderly. Walks with a cane No respiratory difficulty HEENT: normal Neck: supple. JVP 6 Carotids 2+ bilat; no bruits. No lymphadenopathy or thryomegaly appreciated. Cor: PMI nondisplaced. Regular rate & rhythm. 2/6 SEM LSB Lungs: clear Abdomen: obese soft, nontender, nondistended. No hepatosplenomegaly. No bruits or masses. Good bowel sounds. Extremities: no cyanosis, clubbing, rash, no edema Neuro: alert & oriented x 3, cranial nerves grossly intact. moves all 4 extremities w/o difficulty. Affect pleasant.ECG:   ASSESSMENT & PLAN:  1. Pulm HTN    I have reviewed echo personally as well as Dr. Theodosia Blender note. Pulmonary pressures up from previous and now in mild to moderate PH range. Suspect she has WHO Group II (diastolic HF) and III (OSA/OHS/hypoxia) PH. Overall stable NYHA II symptoms. Pressures on echo will fluctuate based on volume status. She desaturated quickly on hall walk particularly with talking. Will plan the following.   - Start supplemental O2 - Continue CPAP - Check PFTs with DLCO - Add spiro 12.5 mg daily to keep volume status in  check. (check BMET in 1 week) - Referred to Pulmonary Rehab - Encouraged weight loss - I do not feel there is strong need for rheumatologic serologies, RHC or VQ at this point.  - I will see her back in 2 months.   Glori Bickers, MD  5:49 PM

## 2016-10-24 ENCOUNTER — Telehealth (HOSPITAL_COMMUNITY): Payer: Self-pay | Admitting: *Deleted

## 2016-10-24 NOTE — Telephone Encounter (Signed)
Patient called in saying [facility] came and dropped off the oxygen tank yesterday afternoon.  Patient is asking for a portable tank to use for when she is going to work and running errands.    I have sent Darlina Guys a staff message asking if patient can have a portable oxygen tank.  I will call patient back with her response.

## 2016-10-24 NOTE — Telephone Encounter (Signed)
[facility] contacted me regarding the portable oxygen tank and stated that patient will be setup for portable oxygen tank system once she has paid her co-pay.  I have called patient back and she understands and will call them for co-payment. No further questions at this time.

## 2016-10-25 DIAGNOSIS — D649 Anemia, unspecified: Secondary | ICD-10-CM | POA: Diagnosis not present

## 2016-10-25 DIAGNOSIS — I509 Heart failure, unspecified: Secondary | ICD-10-CM | POA: Diagnosis not present

## 2016-10-25 DIAGNOSIS — R112 Nausea with vomiting, unspecified: Secondary | ICD-10-CM | POA: Diagnosis not present

## 2016-10-25 DIAGNOSIS — N183 Chronic kidney disease, stage 3 (moderate): Secondary | ICD-10-CM | POA: Diagnosis not present

## 2016-10-25 DIAGNOSIS — R42 Dizziness and giddiness: Secondary | ICD-10-CM | POA: Diagnosis not present

## 2016-10-30 ENCOUNTER — Other Ambulatory Visit (HOSPITAL_COMMUNITY): Payer: Self-pay | Admitting: Internal Medicine

## 2016-11-01 ENCOUNTER — Ambulatory Visit (HOSPITAL_COMMUNITY)
Admission: RE | Admit: 2016-11-01 | Discharge: 2016-11-01 | Disposition: A | Discharge status: Home / Self Care | Payer: 59 | Source: Ambulatory Visit | Attending: Internal Medicine | Admitting: Internal Medicine

## 2016-11-01 DIAGNOSIS — J449 Chronic obstructive pulmonary disease, unspecified: Secondary | ICD-10-CM | POA: Insufficient documentation

## 2016-11-01 DIAGNOSIS — R942 Abnormal results of pulmonary function studies: Secondary | ICD-10-CM | POA: Insufficient documentation

## 2016-11-01 DIAGNOSIS — I272 Pulmonary hypertension, unspecified: Secondary | ICD-10-CM

## 2016-11-01 LAB — PULMONARY FUNCTION TEST
DL/VA % PRED: 98 %
DL/VA: 4.41 ml/min/mmHg/L
DLCO unc % pred: 55 %
DLCO unc: 11.62 ml/min/mmHg
FEF 25-75 POST: 0.54 L/s
FEF 25-75 Pre: 0.5 L/sec
FEF2575-%CHANGE-POST: 6 %
FEF2575-%PRED-POST: 45 %
FEF2575-%Pred-Pre: 42 %
FEV1-%CHANGE-POST: 2 %
FEV1-%PRED-PRE: 67 %
FEV1-%Pred-Post: 68 %
FEV1-POST: 0.95 L
FEV1-PRE: 0.93 L
FEV1FVC-%CHANGE-POST: 6 %
FEV1FVC-%PRED-PRE: 89 %
FEV6-%Change-Post: -3 %
FEV6-%Pred-Post: 76 %
FEV6-%Pred-Pre: 79 %
FEV6-POST: 1.31 L
FEV6-Pre: 1.36 L
FEV6FVC-%PRED-POST: 105 %
FEV6FVC-%Pred-Pre: 105 %
FVC-%CHANGE-POST: -3 %
FVC-%PRED-POST: 72 %
FVC-%PRED-PRE: 75 %
FVC-POST: 1.31 L
FVC-PRE: 1.36 L
PRE FEV6/FVC RATIO: 100 %
Post FEV1/FVC ratio: 73 %
Post FEV6/FVC ratio: 100 %
Pre FEV1/FVC ratio: 68 %
RV % PRED: 90 %
RV: 2.02 L
TLC % pred: 75 %
TLC: 3.51 L

## 2016-11-01 MED ORDER — ALBUTEROL SULFATE (2.5 MG/3ML) 0.083% IN NEBU
2.5000 mg | INHALATION_SOLUTION | Freq: Once | RESPIRATORY_TRACT | Status: AC
  Administered 2016-11-01: 2.5 mg via RESPIRATORY_TRACT

## 2016-11-09 ENCOUNTER — Encounter (HOSPITAL_COMMUNITY)
Admission: RE | Admit: 2016-11-09 | Discharge: 2016-11-09 | Disposition: A | Discharge status: Home / Self Care | Payer: 59 | Source: Ambulatory Visit | Attending: Internal Medicine | Admitting: Internal Medicine

## 2016-11-09 ENCOUNTER — Encounter (HOSPITAL_COMMUNITY): Payer: Self-pay

## 2016-11-09 VITALS — BP 132/41 | HR 58 | Resp 18 | Ht 60.0 in | Wt 215.2 lb

## 2016-11-09 DIAGNOSIS — I272 Pulmonary hypertension, unspecified: Secondary | ICD-10-CM | POA: Diagnosis not present

## 2016-11-09 DIAGNOSIS — Z79899 Other long term (current) drug therapy: Secondary | ICD-10-CM | POA: Insufficient documentation

## 2016-11-09 NOTE — Progress Notes (Signed)
[name] 81 y.o. female Pulmonary Rehab Orientation Note Patient arrived today in Cardiac and Pul[facility] Rehab for orientation to Pulmonary Rehab. She was transported from [employer] via wheel chair. She has recently been prescribed home and portable oxygen but unsure when she should wear it. Per pt, she is only using it at home when she is resting because her portable tanks are too heavy and cumbersome for her to take to work. Will work with patient on getting more suitable portable unit for her active lifestyle. Color good, skin warm and dry. Patient is oriented to time and place. Patient's medical history, psychosocial health, and medications reviewed. Psychosocial assessment reveals pt lives in her own home. Her two adult granddaughters live with her. 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 She states she does not work for financial reasons but because she enjoys the work, socialization, and she feels it keeps her young. Pt hobbies include puzzles, music, and movies. Pt reports her stress level is low. Pt does not exhibit signs of depression. PHQ2/9 score 0/na. Pt shows good  coping skills with positive outlook. She is offered emotional support and reassurance. Will continue to monitor and evaluate progress toward psychosocial goal(s) of remaining physically and emotionally healthy. Physical assessment reveals heart rate is normal, breath sounds clear to auscultation, no wheezes, rales, or rhonchi. Grip strength equal, strong. Distal pulses palpable. Patient reports she does take medications as prescribed. Patient states she follows a Diabetic diet. The patient reports no specific efforts to gain or lose weight. Patient's weight will be monitored closely. Demonstration and practice of PLB using pulse oximeter. Patient able to return demonstration satisfactorily. Safety and hand hygiene in the exercise area reviewed with patient. Patient voices understanding of the information  reviewed. Department expectations discussed with patient and achievable goals were set. The patient shows enthusiasm about attending the program and we look forward to working with this nice lady. The patient is scheduled for a 6 min walk test on Tuesday 6/12 and to begin exercise on Tuesday 6/19.   45 minutes was spent on a variety of activities such as assessment of the patient, obtaining baseline data including height, weight, BMI, and grip strength, verifying medical history, allergies, and current medications, and teaching patient strategies for performing tasks with less respiratory effort with emphasis on pursed lip breathing.

## 2016-11-13 ENCOUNTER — Encounter (HOSPITAL_COMMUNITY)
Admission: RE | Admit: 2016-11-13 | Discharge: 2016-11-13 | Disposition: A | Discharge status: Home / Self Care | Payer: 59 | Source: Ambulatory Visit | Attending: Internal Medicine | Admitting: Internal Medicine

## 2016-11-13 DIAGNOSIS — I272 Pulmonary hypertension, unspecified: Secondary | ICD-10-CM

## 2016-11-13 NOTE — Progress Notes (Signed)
Pulmonary Individual Treatment Plan  Patient Details  Name: [name] MRN: [id number] Date of Birth: Oct 19, 1935 Referring Provider:     Pulmonary Rehab Walk Test from 11/13/2016 in [name]  Referring Provider  Dr. Haroldine Laws      Initial Encounter Date:    Pulmonary Rehab Walk Test from 11/13/2016 in [place]  Date  11/13/16  Referring Provider  Dr. Haroldine Laws      Visit Diagnosis: Pulmonary hypertension ([place])  Patient's Home Medications on Admission:   Current Outpatient Prescriptions:  .  acetaminophen (TYLENOL) 500 MG tablet, Take 500 mg by mouth every 8 (eight) hours as needed for mild pain, moderate pain, fever or headache. , Disp: , Rfl:  .  amLODipine (NORVASC) 10 MG tablet, Take 10 mg by mouth daily., Disp: , Rfl: 6 .  aspirin EC 81 MG tablet, Take 81 mg by mouth daily., Disp: , Rfl:  .  atorvastatin (LIPITOR) 40 MG tablet, Take 40 mg by mouth at bedtime., Disp: , Rfl:  .  Cholecalciferol (VITAMIN D) 2000 UNITS CAPS, Take 2,000 Units by mouth daily., Disp: , Rfl:  .  docusate sodium (COLACE) 100 MG capsule, Take 100 mg by mouth daily as needed for mild constipation., Disp: , Rfl:  .  esomeprazole (NEXIUM) 40 MG capsule, Take 40 mg by mouth daily. , Disp: , Rfl:  .  ezetimibe (ZETIA) 10 MG tablet, Take 10 mg by mouth at bedtime., Disp: , Rfl:  .  furosemide (LASIX) 40 MG tablet, Take 40 mg by mouth daily. , Disp: , Rfl:  .  Multiple Vitamins-Minerals (MULTIVITAMINS THER. W/MINERALS) TABS, Take 1 tablet by mouth daily., Disp: , Rfl:  .  pioglitazone (ACTOS) 30 MG tablet, Take 30 mg by mouth daily., Disp: , Rfl:  .  spironolactone (ALDACTONE) 25 MG tablet, Take 0.5 tablets (12.5 mg total) by mouth daily., Disp: 15 tablet, Rfl: 3 .  valsartan (DIOVAN) 320 MG tablet, Take 320 mg by mouth daily., Disp: , Rfl:   Past Medical History: Past Medical History:  Diagnosis Date  . Anemia    takes iron  .  Arthritis    Dr Ronnie Derby  . Carotid artery stenosis    1-39% bilateral dopplers 08/2015  . Cataracts, bilateral   . Chronic kidney disease    stage III Dr Mercy Moore  . Diabetes mellitus   . GERD (gastroesophageal reflux disease)   . Heart murmur    Dr Irish Lack  . HTN (hypertension)   . Hyperlipidemia   . Morbid obesity with BMI of 40.0-44.9, adult ([place])   . Obstructive sleep apnea (adult) (pediatric)    w CPAP Severe OSA AHI 52/hr CPAP at 10cm H2O  . Pulmonary HTN ([place])    mild with PASP 49mHg echo 02/2014  . Right carotid bruit 08/19/2014  . Shortness of breath    resolved with CPAP  . Urination frequency   . Vitamin D deficiency disease   . Wears dentures    upper  . Wears glasses     Tobacco Use: History  Smoking Status  . Never Smoker  Smokeless Tobacco  . Never Used    Labs: Recent Review Flowsheet Data    Labs for ITP Cardiac and Pulmonary Rehab Latest Ref Rng & Units 05/02/2010 10/04/2015   Hemoglobin A1c 4.8 - 5.6 % 6.4 (NOTE)  According to the ADA Clinical Practice Recommendations for 2011, when HbA1c is used as a screening test:   >=6.5%   Diagnostic of Diabetes Mellitus           (if abnormal result is confirmed)  5.7-6.4%   Increased risk of developing Diabetes Mellitus  References:Diagnosis and Classification of Diabetes Mellitus,Diabetes PFXT,0240,97(DZHGD 1):S62-S69 and Standards of Medical Care in         Diabetes - 2011,Diabetes [id number],6834,19 (Suppl 1):S11-S61.(H) 6.4(H)      Capillary Blood Glucose: Lab Results  Component Value Date   GLUCAP 150 (H) 10/05/2015   GLUCAP 124 (H) 10/05/2015   GLUCAP 168 (H) 10/04/2015   GLUCAP 133 (H) 10/04/2015   GLUCAP 102 (H) 10/04/2015     ADL UCSD:   Pulmonary Function Assessment:     Pulmonary Function Assessment - 11/09/16 1430      Breath   Bilateral Breath Sounds Clear   Shortness of Breath Yes;Limiting activity      Exercise  Target Goals: Date: 11/13/16  Exercise Program Goal: Individual exercise prescription set with THRR, safety & activity barriers. Participant demonstrates ability to understand and report RPE using BORG scale, to self-measure pulse accurately, and to acknowledge the importance of the exercise prescription.  Exercise Prescription Goal: Starting with aerobic activity 30 plus minutes a day, 3 days per week for initial exercise prescription. Provide home exercise prescription and guidelines that participant acknowledges understanding prior to discharge.  Activity Barriers & Risk Stratification:     Activity Barriers & Cardiac Risk Stratification - 11/09/16 1417      Activity Barriers & Cardiac Risk Stratification   Activity Barriers Deconditioning;Shortness of Breath;Arthritis;Left Knee Replacement;Right Knee Replacement;Assistive Device;Balance Concerns;Neck/Spine Problems      6 Minute Walk:     6 Minute Walk    Row Name 11/13/16 1651         6 Minute Walk   Phase Discharge     Distance 1163 feet     Walk Time 6 minutes     # of Rest Breaks 0     MPH 2.2     METS 2.53     RPE 15     Perceived Dyspnea  3     Symptoms Yes (comment)     Comments wheelchair     Resting HR 62 bpm     Resting BP 148/75     Max Ex. HR 105 bpm     Max Ex. BP 189/74     2 Minute Post BP 149/83       Interval HR   Baseline HR 62     1 Minute HR 64     2 Minute HR 62     3 Minute HR 63     4 Minute HR 82     5 Minute HR 104     6 Minute HR 105     2 Minute Post HR 96     Interval Heart Rate? Yes       Interval Oxygen   Interval Oxygen? Yes     Baseline Oxygen Saturation % 100 %     Baseline Liters of Oxygen 2 L     1 Minute Oxygen Saturation % 97 %     1 Minute Liters of Oxygen 2 L     2 Minute Oxygen Saturation % 90 %     2 Minute Liters of Oxygen 2 L     3 Minute Oxygen Saturation % 98 %  3 Minute Liters of Oxygen 2 L     4 Minute Oxygen Saturation % 98 %     4 Minute Liters  of Oxygen 2 L     5 Minute Oxygen Saturation % 100 %     5 Minute Liters of Oxygen 2 L     6 Minute Oxygen Saturation % 98 %     6 Minute Liters of Oxygen 2 L     2 Minute Post Oxygen Saturation % 100 %     2 Minute Post Liters of Oxygen 2 L        Oxygen Initial Assessment:     Oxygen Initial Assessment - 11/13/16 1650      Initial 6 min Walk   Oxygen Used Continuous;E-Tanks   Liters per minute 2   Resting Oxygen Saturation  during 6 min walk 100 %   Exercise Oxygen Saturation  during 6 min walk 90 %     Program Oxygen Prescription   Program Oxygen Prescription Continuous;E-Tanks   Liters per minute 2      Oxygen Re-Evaluation:   Oxygen Discharge (Final Oxygen Re-Evaluation):   Initial Exercise Prescription:     Initial Exercise Prescription - 11/13/16 1600      Date of Initial Exercise RX and Referring Provider   Date 11/13/16   Referring Provider Dr. Haroldine Laws     Oxygen   Oxygen Continuous   Liters 2     NuStep   Level 2   Minutes 17   METs 1.5     Arm Ergometer   Level 2   Minutes 17   METs 1.5     Track   Laps 7   Minutes 17     Prescription Details   Frequency (times per week) 2   Duration Progress to 45 minutes of aerobic exercise without signs/symptoms of physical distress     Intensity   THRR 40-80% of Max Heartrate 57-114   Ratings of Perceived Exertion 11-13   Perceived Dyspnea 0-4     Progression   Progression Continue progressive overload as per policy without signs/symptoms or physical distress.     Resistance Training   Training Prescription Yes   Weight orange bands   Reps 10-15      Perform Capillary Blood Glucose checks as needed.  Exercise Prescription Changes:   Exercise Comments:   Exercise Goals and Review:     Exercise Goals    Row Name 11/09/16 1418             Exercise Goals   Increase Physical Activity Yes       Intervention Provide advice, education, support and counseling about physical  activity/exercise needs.;Develop an individualized exercise prescription for aerobic and resistive training based on initial evaluation findings, risk stratification, comorbidities and participant's personal goals.       Expected Outcomes Achievement of increased cardiorespiratory fitness and enhanced flexibility, muscular endurance and strength shown through measurements of functional capacity and personal statement of participant.       Increase Strength and Stamina Yes       Intervention Provide advice, education, support and counseling about physical activity/exercise needs.;Develop an individualized exercise prescription for aerobic and resistive training based on initial evaluation findings, risk stratification, comorbidities and participant's personal goals.       Expected Outcomes Achievement of increased cardiorespiratory fitness and enhanced flexibility, muscular endurance and strength shown through measurements of functional capacity and personal statement of participant.  Exercise Goals Re-Evaluation :   Discharge Exercise Prescription (Final Exercise Prescription Changes):   Nutrition:  Target Goals: Understanding of nutrition guidelines, daily intake of sodium 1500mg , cholesterol 200mg , calories 30% from fat and 7% or less from saturated fats, daily to have 5 or more servings of fruits and vegetables.  Biometrics:     Pre Biometrics - 11/09/16 1418      Pre Biometrics   Grip Strength 18 kg       Nutrition Therapy Plan and Nutrition Goals:   Nutrition Discharge: Rate Your Plate Scores:   Nutrition Goals Re-Evaluation:   Nutrition Goals Discharge (Final Nutrition Goals Re-Evaluation):   Psychosocial: Target Goals: Acknowledge presence or absence of significant depression and/or stress, maximize coping skills, provide positive support system. Participant is able to verbalize types and ability to use techniques and skills needed for reducing stress and  depression.  Initial Review & Psychosocial Screening:     Initial Psych Review & Screening - 11/09/16 1436      Initial Review   Current issues with None Identified     Family Dynamics   Good Support System? Yes     Barriers   Psychosocial barriers to participate in program There are no identifiable barriers or psychosocial needs.      Quality of Life Scores:   PHQ-9: Recent Review Flowsheet Data    Depression screen [facility] 2/9 11/09/2016   Decreased Interest 0   Down, Depressed, Hopeless 0   PHQ - 2 Score 0     Interpretation of Total Score  Total Score Depression Severity:  1-4 = Minimal depression, 5-9 = Mild depression, 10-14 = Moderate depression, 15-19 = Moderately severe depression, 20-27 = Severe depression   Psychosocial Evaluation and Intervention:     Psychosocial Evaluation - 11/09/16 1438      Psychosocial Evaluation & Interventions   Interventions Encouraged to exercise with the program and follow exercise prescription   Continue Psychosocial Services  No Follow up required      Psychosocial Re-Evaluation:     Psychosocial Re-Evaluation    [place] Name 11/09/16 1439             Psychosocial Re-Evaluation   Current issues with None Identified       Interventions Encouraged to attend Pulmonary Rehabilitation for the exercise       Continue Psychosocial Services  No Follow up required          Psychosocial Discharge (Final Psychosocial Re-Evaluation):     Psychosocial Re-Evaluation - 11/09/16 1439      Psychosocial Re-Evaluation   Current issues with None Identified   Interventions Encouraged to attend Pulmonary Rehabilitation for the exercise   Continue Psychosocial Services  No Follow up required      Education: Education Goals: Education classes will be provided on a weekly basis, covering required topics. Participant will state understanding/return demonstration of topics presented.  Learning Barriers/Preferences:     Learning  Barriers/Preferences - 11/09/16 1430      Learning Barriers/Preferences   Learning Barriers None   Learning Preferences Group Instruction;Individual Instruction;Written Material;Skilled Demonstration      Education Topics: Risk Factor Reduction:  -Group instruction that is supported by a PowerPoint presentation. Instructor discusses the definition of a risk factor, different risk factors for pulmonary disease, and how the heart and lungs work together.     Nutrition for Pulmonary Patient:  -Group instruction provided by PowerPoint slides, verbal discussion, and written materials to support subject matter. The instructor gives an  explanation and review of healthy diet recommendations, which includes a discussion on weight management, recommendations for fruit and vegetable consumption, as well as protein, fluid, caffeine, fiber, sodium, sugar, and alcohol. Tips for eating when patients are short of breath are discussed.   Pursed Lip Breathing:  -Group instruction that is supported by demonstration and informational handouts. Instructor discusses the benefits of pursed lip and diaphragmatic breathing and detailed demonstration on how to preform both.     Oxygen Safety:  -Group instruction provided by PowerPoint, verbal discussion, and written material to support subject matter. There is an overview of "What is Oxygen" and "Why do we need it".  Instructor also reviews how to create a safe environment for oxygen use, the importance of using oxygen as prescribed, and the risks of noncompliance. There is a brief discussion on traveling with oxygen and resources the patient may utilize.   Oxygen Equipment:  -Group instruction provided by [facility] Staff utilizing handouts, written materials, and equipment demonstrations.   Signs and Symptoms:  -Group instruction provided by written material and verbal discussion to support subject matter. Warning signs and symptoms of infection, stroke, and  heart attack are reviewed and when to call the physician/911 reinforced. Tips for preventing the spread of infection discussed.   Advanced Directives:  -Group instruction provided by verbal instruction and written material to support subject matter. Instructor reviews Advanced Directive laws and proper instruction for filling out document.   Pulmonary Video:  -Group video education that reviews the importance of medication and oxygen compliance, exercise, good nutrition, pulmonary hygiene, and pursed lip and diaphragmatic breathing for the pulmonary patient.   Exercise for the Pulmonary Patient:  -Group instruction that is supported by a PowerPoint presentation. Instructor discusses benefits of exercise, core components of exercise, frequency, duration, and intensity of an exercise routine, importance of utilizing pulse oximetry during exercise, safety while exercising, and options of places to exercise outside of rehab.     Pulmonary Medications:  -Verbally interactive group education provided by instructor with focus on inhaled medications and proper administration.   Anatomy and Physiology of the Respiratory System and Intimacy:  -Group instruction provided by PowerPoint, verbal discussion, and written material to support subject matter. Instructor reviews respiratory cycle and anatomical components of the respiratory system and their functions. Instructor also reviews differences in obstructive and restrictive respiratory diseases with examples of each. Intimacy, Sex, and Sexuality differences are reviewed with a discussion on how relationships can change when diagnosed with pulmonary disease. Common sexual concerns are reviewed.   MD DAY -A group question and answer session with a medical doctor that allows participants to ask questions that relate to their pulmonary disease state.   OTHER EDUCATION -Group or individual verbal, written, or video instructions that support the  educational goals of the pulmonary rehab program.   Knowledge Questionnaire Score:   Core Components/Risk Factors/Patient Goals at Admission:     Personal Goals and Risk Factors at Admission - 11/09/16 1435      Core Components/Risk Factors/Patient Goals on Admission    Weight Management Yes;Obesity   Intervention Obesity: Provide education and appropriate resources to help participant work on and attain dietary goals.;Weight Management/Obesity: Establish reasonable short term and long term weight goals.;Weight Management: Provide education and appropriate resources to help participant work on and attain dietary goals.;Weight Management: Develop a combined nutrition and exercise program designed to reach desired caloric intake, while maintaining appropriate intake of nutrient and fiber, sodium and fats, and appropriate energy expenditure required  for the weight goal.   Admit Weight 214 lb 11.5 oz (97.4 kg)   Expected Outcomes Short Term: Continue to assess and modify interventions until short term weight is achieved;Weight Loss: Understanding of general recommendations for a balanced deficit meal plan, which promotes 1-2 lb weight loss per week and includes a negative energy balance of [phone] kcal/d;Understanding recommendations for meals to include 15-35% energy as protein, 25-35% energy from fat, 35-60% energy from carbohydrates, less than 200mg  of dietary cholesterol, 20-35 gm of total fiber daily;Understanding of distribution of calorie intake throughout the day with the consumption of 4-5 meals/snacks   Improve shortness of breath with ADL's Yes   Intervention Provide education, individualized exercise plan and daily activity instruction to help decrease symptoms of SOB with activities of daily living.   Expected Outcomes Short Term: Achieves a reduction of symptoms when performing activities of daily living.   Develop more efficient breathing techniques such as purse lipped breathing and  diaphragmatic breathing; and practicing self-pacing with activity Yes   Intervention Provide education, demonstration and support about specific breathing techniuqes utilized for more efficient breathing. Include techniques such as pursed lipped breathing, diaphragmatic breathing and self-pacing activity.   Expected Outcomes Short Term: Participant will be able to demonstrate and use breathing techniques as needed throughout daily activities.      Core Components/Risk Factors/Patient Goals Review:    Core Components/Risk Factors/Patient Goals at Discharge (Final Review):    ITP Comments:   Comments:

## 2016-11-20 ENCOUNTER — Encounter (HOSPITAL_COMMUNITY)
Admission: RE | Admit: 2016-11-20 | Discharge: 2016-11-20 | Disposition: A | Discharge status: Home / Self Care | Payer: 59 | Source: Ambulatory Visit | Attending: Internal Medicine | Admitting: Internal Medicine

## 2016-11-20 VITALS — Wt 214.7 lb

## 2016-11-20 DIAGNOSIS — I272 Pulmonary hypertension, unspecified: Secondary | ICD-10-CM

## 2016-11-20 LAB — GLUCOSE, CAPILLARY
GLUCOSE-CAPILLARY: 115 mg/dL — AB (ref 65–99)
Glucose-Capillary: 117 mg/dL — ABNORMAL HIGH (ref 65–99)

## 2016-11-20 NOTE — Progress Notes (Signed)
Daily Session Note  Patient Details  Name: [name] MRN: [id number] Date of Birth: 02-04-1936 Referring Provider:     Pulmonary Rehab Walk Test from 11/13/2016 in [place]  Referring Provider  Dr. Haroldine Laws      Encounter Date: 11/20/2016  Check In:     Session Check In - 11/20/16 1330      Check-In   Location [facility]   Staff Present Rosebud Poles, RN, BSN;Molly diVincenzo, MS, ACSM RCEP, Exercise Physiologist;Portia Rollene Rotunda, RN, BSN   Supervising physician immediately available to respond to emergencies [facility] immediately available   Physician(s) Dr. Allyson Sabal   Medication changes reported     No   Fall or balance concerns reported    No   Comments patient uses cane to steady herself when standing initially. She has had knee surgery   Tobacco Cessation No Change   Warm-up and Cool-down Performed as group-led instruction   Resistance Training Performed Yes   VAD Patient? No     Pain Assessment   Currently in Pain? No/denies   Multiple Pain Sites No      Capillary Blood Glucose: Results for orders placed or performed during the hospital encounter of 11/20/16 (from the past 24 hour(s))  Glucose, capillary     Status: Abnormal   Collection Time: 11/20/16  1:25 PM  Result Value Ref Range   Glucose-Capillary 115 (H) 65 - 99 mg/dL  Glucose, capillary     Status: Abnormal   Collection Time: 11/20/16  2:36 PM  Result Value Ref Range   Glucose-Capillary 117 (H) 65 - 99 mg/dL        Exercise Prescription Changes - 11/20/16 1500      Response to Exercise   Blood Pressure (Admit) 122/56   Blood Pressure (Exercise) 136/70   Blood Pressure (Exit) 120/56   Heart Rate (Admit) 62 bpm   Heart Rate (Exercise) 85 bpm   Heart Rate (Exit) 62 bpm   Oxygen Saturation (Admit) 97 %   Oxygen Saturation (Exercise) 100 %   Oxygen Saturation (Exit) 99 %   Rating of Perceived Exertion (Exercise) 13   Perceived Dyspnea  (Exercise) 2   Duration Progress to 45 minutes of aerobic exercise without signs/symptoms of physical distress   Intensity --  40-80% HRR     Resistance Training   Training Prescription Yes   Weight orange bands   Reps 10-15   Time --  10 minutes     Interval Training   Interval Training No     Oxygen   Oxygen Continuous   Liters 2     NuStep   Level 2   Minutes 17   METs 1.6     Arm Ergometer   Level 2   Minutes 17     Track   Laps 9   Minutes 17      History  Smoking Status  . Never Smoker  Smokeless Tobacco  . Never Used    Goals Met:  Exercise tolerated well Strength training completed today  Goals Unmet:  Not Applicable  Comments: Service time is from 1330 to 1500   Dr. Rush Farmer is Medical Director for Pulmonary Rehab at [facility].

## 2016-11-22 ENCOUNTER — Encounter (HOSPITAL_COMMUNITY)
Admission: RE | Admit: 2016-11-22 | Discharge: 2016-11-22 | Disposition: A | Discharge status: Home / Self Care | Payer: 59 | Source: Ambulatory Visit | Attending: Internal Medicine | Admitting: Internal Medicine

## 2016-11-22 VITALS — Wt 217.4 lb

## 2016-11-22 DIAGNOSIS — I272 Pulmonary hypertension, unspecified: Secondary | ICD-10-CM

## 2016-11-22 LAB — GLUCOSE, CAPILLARY: GLUCOSE-CAPILLARY: 106 mg/dL — AB (ref 65–99)

## 2016-11-22 NOTE — Progress Notes (Signed)
Daily Session Note  Patient Details  Name: [name] MRN: [id number] Date of Birth: [birth year] Referring Provider:     Pulmonary Rehab Walk Test from 11/13/2016 in [place]  Referring Provider  Dr. Haroldine Laws      Encounter Date: 11/22/2016  Check In:     Session Check In - 11/22/16 1330      Check-In   Location [facility]   Staff Present Rosebud Poles, RN, BSN;Molly diVincenzo, MS, ACSM RCEP, Exercise Physiologist;Tranesha Lessner Rollene Rotunda, RN, BSN   Supervising physician immediately available to respond to emergencies [facility] immediately available   Physician(s) Dr. Nada Maclachlan   Medication changes reported     No   Fall or balance concerns reported    No   Tobacco Cessation No Change   Warm-up and Cool-down Performed as group-led instruction   Resistance Training Performed Yes   VAD Patient? No     Pain Assessment   Currently in Pain? No/denies   Multiple Pain Sites No      Capillary Blood Glucose: Results for orders placed or performed during the hospital encounter of 11/22/16 (from the past 24 hour(s))  Glucose, capillary     Status: Abnormal   Collection Time: 11/22/16  3:08 PM  Result Value Ref Range   Glucose-Capillary 106 (H) 65 - 99 mg/dL       POCT Glucose - 11/22/16 1534      POCT Blood Glucose   Pre-Exercise 115 mg/dL   Post-Exercise 104 mg/dL         Exercise Prescription Changes - 11/22/16 1532      Response to Exercise   Blood Pressure (Admit) 108/58   Blood Pressure (Exercise) 106/60   Blood Pressure (Exit) 102/60   Heart Rate (Admit) 57 bpm   Heart Rate (Exercise) 72 bpm   Heart Rate (Exit) 64 bpm   Oxygen Saturation (Admit) 97 %   Oxygen Saturation (Exercise) 95 %   Oxygen Saturation (Exit) 94 %   Rating of Perceived Exertion (Exercise) 13   Perceived Dyspnea (Exercise) 1   Duration Progress to 45 minutes of aerobic exercise without signs/symptoms of physical distress   Intensity --   40-80% HRR     Resistance Training   Training Prescription Yes   Weight orange bands   Reps 10-15   Time 10 Minutes     Interval Training   Interval Training No     Oxygen   Oxygen Continuous   Liters 2     Arm Ergometer   Level 2   Minutes 17     Track   Laps 10   Minutes 17      History  Smoking Status  . Never Smoker  Smokeless Tobacco  . Never Used    Goals Met:  Exercise tolerated well Queuing for purse lip breathing No report of cardiac concerns or symptoms Strength training completed today  Goals Unmet:  Not Applicable  Comments: Service time is from 1330 to 1520   Dr. Rush Farmer is Medical Director for Pulmonary Rehab at [facility].

## 2016-11-23 LAB — GLUCOSE, CAPILLARY: Glucose-Capillary: 115 mg/dL — ABNORMAL HIGH (ref 65–99)

## 2016-11-26 ENCOUNTER — Other Ambulatory Visit: Payer: Self-pay | Admitting: Family Medicine

## 2016-11-26 DIAGNOSIS — Z1231 Encounter for screening mammogram for malignant neoplasm of breast: Secondary | ICD-10-CM

## 2016-11-27 ENCOUNTER — Encounter (HOSPITAL_COMMUNITY)
Admission: RE | Admit: 2016-11-27 | Discharge: 2016-11-27 | Disposition: A | Discharge status: Home / Self Care | Payer: 59 | Source: Ambulatory Visit | Attending: Internal Medicine | Admitting: Internal Medicine

## 2016-11-27 VITALS — Wt 214.9 lb

## 2016-11-27 DIAGNOSIS — I272 Pulmonary hypertension, unspecified: Secondary | ICD-10-CM

## 2016-11-27 NOTE — Progress Notes (Signed)
Daily Session Note  Patient Details  Name: [name] MRN: [id number] Date of Birth: [birth year] Referring Provider:     Pulmonary Rehab Walk Test from 11/13/2016 in [place]  Referring Provider  Dr. Haroldine Laws      Encounter Date: 11/27/2016  Check In:     Session Check In - 11/27/16 1328      Check-In   Location [facility]   Staff Present Rosebud Poles, RN, BSN;Molly diVincenzo, MS, ACSM RCEP, Exercise Physiologist;Harlyn Italiano Ysidro Evert, RN   Supervising physician immediately available to respond to emergencies [facility] immediately available   Physician(s) Dr. Nada Maclachlan   Medication changes reported     No   Fall or balance concerns reported    No   Tobacco Cessation No Change   Warm-up and Cool-down Performed as group-led instruction   Resistance Training Performed Yes   VAD Patient? No     Pain Assessment   Currently in Pain? No/denies   Multiple Pain Sites No      Capillary Blood Glucose: No results found for this or any previous visit (from the past 24 hour(s)).     POCT Glucose - 11/27/16 1621      POCT Blood Glucose   Pre-Exercise 108 mg/dL   Post-Exercise 114 mg/dL         Exercise Prescription Changes - 11/27/16 1600      Response to Exercise   Blood Pressure (Admit) 114/56   Blood Pressure (Exercise) 110/60   Blood Pressure (Exit) 100/60   Heart Rate (Admit) 59 bpm   Heart Rate (Exercise) 73 bpm   Heart Rate (Exit) 59 bpm   Oxygen Saturation (Admit) 99 %   Oxygen Saturation (Exercise) 98 %   Oxygen Saturation (Exit) 100 %   Rating of Perceived Exertion (Exercise) 13   Perceived Dyspnea (Exercise) 1   Duration Progress to 45 minutes of aerobic exercise without signs/symptoms of physical distress   Intensity THRR unchanged     Progression   Progression Continue to progress workloads to maintain intensity without signs/symptoms of physical distress.     Resistance Training   Training  Prescription Yes   Weight orange bands   Reps 10-15   Time 10 Minutes     Interval Training   Interval Training No     Oxygen   Oxygen Continuous   Liters 2     NuStep   Level 2   Minutes 17   METs 1.9     Arm Ergometer   Level 2   Minutes 17     Track   Laps 11   Minutes 17      History  Smoking Status  . Never Smoker  Smokeless Tobacco  . Never Used    Goals Met:  No report of cardiac concerns or symptoms Strength training completed today  Goals Unmet:  Not Applicable  Comments: Service time is from 1330 to 1500    Dr. Rush Farmer is Medical Director for Pulmonary Rehab at [facility].

## 2016-11-29 ENCOUNTER — Encounter (HOSPITAL_COMMUNITY)
Admission: RE | Admit: 2016-11-29 | Discharge: 2016-11-29 | Disposition: A | Discharge status: Home / Self Care | Payer: 59 | Source: Ambulatory Visit | Attending: Internal Medicine | Admitting: Internal Medicine

## 2016-11-29 VITALS — Wt 219.6 lb

## 2016-11-29 DIAGNOSIS — I272 Pulmonary hypertension, unspecified: Secondary | ICD-10-CM

## 2016-11-29 NOTE — Progress Notes (Signed)
Patient showed up to Pulmonary Rehab for an exercise session complaining of neck and shoulder pain. Patient stayed for our 30 minute education session but then decided to go home. Will follow up with patient next week to see if there is improvement in the pain.

## 2016-12-03 DIAGNOSIS — M9901 Segmental and somatic dysfunction of cervical region: Secondary | ICD-10-CM | POA: Diagnosis not present

## 2016-12-03 DIAGNOSIS — M50322 Other cervical disc degeneration at C5-C6 level: Secondary | ICD-10-CM | POA: Diagnosis not present

## 2016-12-03 DIAGNOSIS — M5136 Other intervertebral disc degeneration, lumbar region: Secondary | ICD-10-CM | POA: Diagnosis not present

## 2016-12-03 DIAGNOSIS — M9903 Segmental and somatic dysfunction of lumbar region: Secondary | ICD-10-CM | POA: Diagnosis not present

## 2016-12-04 ENCOUNTER — Encounter (HOSPITAL_COMMUNITY)
Admission: RE | Admit: 2016-12-04 | Discharge: 2016-12-04 | Disposition: A | Discharge status: Home / Self Care | Payer: 59 | Source: Ambulatory Visit | Attending: Internal Medicine | Admitting: Internal Medicine

## 2016-12-04 DIAGNOSIS — I272 Pulmonary hypertension, unspecified: Secondary | ICD-10-CM | POA: Insufficient documentation

## 2016-12-04 NOTE — Progress Notes (Signed)
Pulmonary Individual Treatment Plan  Patient Details  Name: [name] MRN: [id number] Date of Birth: Oct 19, 1935 Referring Provider:     Pulmonary Rehab Walk Test from 11/13/2016 in [name]  Referring Provider  Dr. Haroldine Laws      Initial Encounter Date:    Pulmonary Rehab Walk Test from 11/13/2016 in [place]  Date  11/13/16  Referring Provider  Dr. Haroldine Laws      Visit Diagnosis: Pulmonary hypertension ([place])  Patient's Home Medications on Admission:   Current Outpatient Prescriptions:  .  acetaminophen (TYLENOL) 500 MG tablet, Take 500 mg by mouth every 8 (eight) hours as needed for mild pain, moderate pain, fever or headache. , Disp: , Rfl:  .  amLODipine (NORVASC) 10 MG tablet, Take 10 mg by mouth daily., Disp: , Rfl: 6 .  aspirin EC 81 MG tablet, Take 81 mg by mouth daily., Disp: , Rfl:  .  atorvastatin (LIPITOR) 40 MG tablet, Take 40 mg by mouth at bedtime., Disp: , Rfl:  .  Cholecalciferol (VITAMIN D) 2000 UNITS CAPS, Take 2,000 Units by mouth daily., Disp: , Rfl:  .  docusate sodium (COLACE) 100 MG capsule, Take 100 mg by mouth daily as needed for mild constipation., Disp: , Rfl:  .  esomeprazole (NEXIUM) 40 MG capsule, Take 40 mg by mouth daily. , Disp: , Rfl:  .  ezetimibe (ZETIA) 10 MG tablet, Take 10 mg by mouth at bedtime., Disp: , Rfl:  .  furosemide (LASIX) 40 MG tablet, Take 40 mg by mouth daily. , Disp: , Rfl:  .  Multiple Vitamins-Minerals (MULTIVITAMINS THER. W/MINERALS) TABS, Take 1 tablet by mouth daily., Disp: , Rfl:  .  pioglitazone (ACTOS) 30 MG tablet, Take 30 mg by mouth daily., Disp: , Rfl:  .  spironolactone (ALDACTONE) 25 MG tablet, Take 0.5 tablets (12.5 mg total) by mouth daily., Disp: 15 tablet, Rfl: 3 .  valsartan (DIOVAN) 320 MG tablet, Take 320 mg by mouth daily., Disp: , Rfl:   Past Medical History: Past Medical History:  Diagnosis Date  . Anemia    takes iron  .  Arthritis    Dr Ronnie Derby  . Carotid artery stenosis    1-39% bilateral dopplers 08/2015  . Cataracts, bilateral   . Chronic kidney disease    stage III Dr Mercy Moore  . Diabetes mellitus   . GERD (gastroesophageal reflux disease)   . Heart murmur    Dr Irish Lack  . HTN (hypertension)   . Hyperlipidemia   . Morbid obesity with BMI of 40.0-44.9, adult ([place])   . Obstructive sleep apnea (adult) (pediatric)    w CPAP Severe OSA AHI 52/hr CPAP at 10cm H2O  . Pulmonary HTN ([place])    mild with PASP 49mHg echo 02/2014  . Right carotid bruit 08/19/2014  . Shortness of breath    resolved with CPAP  . Urination frequency   . Vitamin D deficiency disease   . Wears dentures    upper  . Wears glasses     Tobacco Use: History  Smoking Status  . Never Smoker  Smokeless Tobacco  . Never Used    Labs: Recent Review Flowsheet Data    Labs for ITP Cardiac and Pulmonary Rehab Latest Ref Rng & Units 05/02/2010 10/04/2015   Hemoglobin A1c 4.8 - 5.6 % 6.4 (NOTE)  According to the ADA Clinical Practice Recommendations for 2011, when HbA1c is used as a screening test:   >=6.5%   Diagnostic of Diabetes Mellitus           (if abnormal result is confirmed)  5.7-6.4%   Increased risk of developing Diabetes Mellitus  References:Diagnosis and Classification of Diabetes Mellitus,Diabetes [facility],9470,96(GEZMO 1):S62-S69 and Standards of Medical Care in         Diabetes - 2011,Diabetes QHUT,6546,50 (Suppl 1):S11-S61.(H) 6.4(H)      Capillary Blood Glucose: Lab Results  Component Value Date   GLUCAP 106 (H) 11/22/2016   GLUCAP 115 (H) 11/22/2016   GLUCAP 117 (H) 11/20/2016   GLUCAP 115 (H) 11/20/2016   GLUCAP 150 (H) 10/05/2015       POCT Glucose    Row Name 11/22/16 1534 11/27/16 1621           POCT Blood Glucose   Pre-Exercise 115 mg/dL 108 mg/dL      Post-Exercise 104 mg/dL 114 mg/dL         ADL UCSD:   Pulmonary Function  Assessment:     Pulmonary Function Assessment - 11/09/16 1430      Breath   Bilateral Breath Sounds Clear   Shortness of Breath Yes;Limiting activity      Exercise Target Goals:    Exercise Program Goal: Individual exercise prescription set with THRR, safety & activity barriers. Participant demonstrates ability to understand and report RPE using BORG scale, to self-measure pulse accurately, and to acknowledge the importance of the exercise prescription.  Exercise Prescription Goal: Starting with aerobic activity 30 plus minutes a day, 3 days per week for initial exercise prescription. Provide home exercise prescription and guidelines that participant acknowledges understanding prior to discharge.  Activity Barriers & Risk Stratification:     Activity Barriers & Cardiac Risk Stratification - 11/09/16 1417      Activity Barriers & Cardiac Risk Stratification   Activity Barriers Deconditioning;Shortness of Breath;Arthritis;Left Knee Replacement;Right Knee Replacement;Assistive Device;Balance Concerns;Neck/Spine Problems      6 Minute Walk:     6 Minute Walk    Row Name 11/13/16 1651         6 Minute Walk   Phase Discharge     Distance 1163 feet     Walk Time 6 minutes     # of Rest Breaks 0     MPH 2.2     METS 2.53     RPE 15     Perceived Dyspnea  3     Symptoms Yes (comment)     Comments wheelchair     Resting HR 62 bpm     Resting BP 148/75     Max Ex. HR 105 bpm     Max Ex. BP 189/74     2 Minute Post BP 149/83       Interval HR   Baseline HR 62     1 Minute HR 64     2 Minute HR 62     3 Minute HR 63     4 Minute HR 82     5 Minute HR 104     6 Minute HR 105     2 Minute Post HR 96     Interval Heart Rate? Yes       Interval Oxygen   Interval Oxygen? Yes     Baseline Oxygen Saturation % 100 %     Baseline Liters of Oxygen 2 L     1  Minute Oxygen Saturation % 97 %     1 Minute Liters of Oxygen 2 L     2 Minute Oxygen Saturation % 90 %     2  Minute Liters of Oxygen 2 L     3 Minute Oxygen Saturation % 98 %     3 Minute Liters of Oxygen 2 L     4 Minute Oxygen Saturation % 98 %     4 Minute Liters of Oxygen 2 L     5 Minute Oxygen Saturation % 100 %     5 Minute Liters of Oxygen 2 L     6 Minute Oxygen Saturation % 98 %     6 Minute Liters of Oxygen 2 L     2 Minute Post Oxygen Saturation % 100 %     2 Minute Post Liters of Oxygen 2 L        Oxygen Initial Assessment:     Oxygen Initial Assessment - 12/03/16 1142      Initial 6 min Walk   Oxygen Used Continuous;E-Tanks   Liters per minute 1  1-2 liters      Oxygen Re-Evaluation:     Oxygen Re-Evaluation    Row Name 12/04/16 0930 12/04/16 0931           Program Oxygen Prescription   Program Oxygen Prescription Continuous  -      Liters per minute 2  -        Home Oxygen   Home Oxygen Device Home Concentrator;E-Tanks  -      Sleep Oxygen Prescription CPAP  -      Home Exercise Oxygen Prescription Continuous Continuous      Liters per minute  - 2      Home at Rest Exercise Oxygen Prescription  - Continuous      Liters per minute  - 2      Compliance with Home Oxygen Use  - Yes        Goals/Expected Outcomes   Short Term Goals  - To learn and exhibit compliance with exercise, home and travel O2 prescription;To learn and understand importance of monitoring SPO2 with pulse oximeter and demonstrate accurate use of the pulse oximeter.;To Learn and understand importance of maintaining oxygen saturations>88%;To learn and demonstrate proper purse lipped breathing techniques or other breathing techniques.      Long  Term Goals  - Exhibits compliance with exercise, home and travel O2 prescription      Comments  - Is compliant with oxygen at this time      Goals/Expected Outcomes  - continued compliance         Oxygen Discharge (Final Oxygen Re-Evaluation):     Oxygen Re-Evaluation - 12/04/16 0931      Home Oxygen   Home Exercise Oxygen Prescription  Continuous   Liters per minute 2   Home at Rest Exercise Oxygen Prescription Continuous   Liters per minute 2   Compliance with Home Oxygen Use Yes     Goals/Expected Outcomes   Short Term Goals To learn and exhibit compliance with exercise, home and travel O2 prescription;To learn and understand importance of monitoring SPO2 with pulse oximeter and demonstrate accurate use of the pulse oximeter.;To Learn and understand importance of maintaining oxygen saturations>88%;To learn and demonstrate proper purse lipped breathing techniques or other breathing techniques.   Long  Term Goals Exhibits compliance with exercise, home and travel O2 prescription   Comments Is compliant with oxygen at  this time   Goals/Expected Outcomes continued compliance      Initial Exercise Prescription:     Initial Exercise Prescription - 11/13/16 1600      Date of Initial Exercise RX and Referring Provider   Date 11/13/16   Referring Provider Dr. Haroldine Laws     Oxygen   Oxygen Continuous   Liters 2     NuStep   Level 2   Minutes 17   METs 1.5     Arm Ergometer   Level 2   Minutes 17   METs 1.5     Track   Laps 7   Minutes 17     Prescription Details   Frequency (times per week) 2   Duration Progress to 45 minutes of aerobic exercise without signs/symptoms of physical distress     Intensity   THRR 40-80% of Max Heartrate 57-114   Ratings of Perceived Exertion 11-13   Perceived Dyspnea 0-4     Progression   Progression Continue progressive overload as per policy without signs/symptoms or physical distress.     Resistance Training   Training Prescription Yes   Weight orange bands   Reps 10-15      Perform Capillary Blood Glucose checks as needed.  Exercise Prescription Changes:     Exercise Prescription Changes    Row Name 11/20/16 1500 11/22/16 1532 11/27/16 1600         Response to Exercise   Blood Pressure (Admit) 122/56 108/58 114/56     Blood Pressure (Exercise)  136/70 106/60 110/60     Blood Pressure (Exit) 120/56 102/60 100/60     Heart Rate (Admit) 62 bpm 57 bpm 59 bpm     Heart Rate (Exercise) 85 bpm 72 bpm 73 bpm     Heart Rate (Exit) 62 bpm 64 bpm 59 bpm     Oxygen Saturation (Admit) 97 % 97 % 99 %     Oxygen Saturation (Exercise) 100 % 95 % 98 %     Oxygen Saturation (Exit) 99 % 94 % 100 %     Rating of Perceived Exertion (Exercise) _0 Perceived Dyspnea (Exercise) _1 Duration Progress to 45 minutes of aerobic exercise without signs/symptoms of physical distress Progress to 45 minutes of aerobic exercise without signs/symptoms of physical distress Progress to 45 minutes of aerobic exercise without signs/symptoms of physical distress     Intensity -  40-80% HRR -  40-80% HRR THRR unchanged       Progression   Progression  -  - Continue to progress workloads to maintain intensity without signs/symptoms of physical distress.       Resistance Training   Training Prescription Yes Yes Yes     Weight orange bands orange bands orange bands     Reps 10-15 10-15 10-15     Time -  10 minutes 10 Minutes 10 Minutes       Interval Training   Interval Training No No No       Oxygen   Oxygen Continuous Continuous Continuous     Liters _2 NuStep   Level 2  - 2     Minutes 17  - 17     METs 1.6  - 1.9       Arm Ergometer   Level _3 Minutes 63 87 56  Track   Laps _0 Minutes _1 Exercise Comments:   Exercise Goals and Review:     Exercise Goals    Row Name 11/09/16 1418             Exercise Goals   Increase Physical Activity Yes       Intervention Provide advice, education, support and counseling about physical activity/exercise needs.;Develop an individualized exercise prescription for aerobic and resistive training based on initial evaluation findings, risk stratification, comorbidities and participant's personal goals.       Expected Outcomes Achievement of  increased cardiorespiratory fitness and enhanced flexibility, muscular endurance and strength shown through measurements of functional capacity and personal statement of participant.       Increase Strength and Stamina Yes       Intervention Provide advice, education, support and counseling about physical activity/exercise needs.;Develop an individualized exercise prescription for aerobic and resistive training based on initial evaluation findings, risk stratification, comorbidities and participant's personal goals.       Expected Outcomes Achievement of increased cardiorespiratory fitness and enhanced flexibility, muscular endurance and strength shown through measurements of functional capacity and personal statement of participant.          Exercise Goals Re-Evaluation :     Exercise Goals Re-Evaluation    Row Name 12/03/16 1143             Exercise Goal Re-Evaluation   Exercise Goals Review Increase Physical Activity;Increase Strenth and Stamina       Comments Patient has only attended three exercise sessions. Will cont. to monitor and progress as able.        Expected Outcomes Through exercising at rehab and at home, patient will increase physical strength and stamina and find ADL's easier to perform.           Discharge Exercise Prescription (Final Exercise Prescription Changes):     Exercise Prescription Changes - 11/27/16 1600      Response to Exercise   Blood Pressure (Admit) 114/56   Blood Pressure (Exercise) 110/60   Blood Pressure (Exit) 100/60   Heart Rate (Admit) 59 bpm   Heart Rate (Exercise) 73 bpm   Heart Rate (Exit) 59 bpm   Oxygen Saturation (Admit) 99 %   Oxygen Saturation (Exercise) 98 %   Oxygen Saturation (Exit) 100 %   Rating of Perceived Exertion (Exercise) 13   Perceived Dyspnea (Exercise) 1   Duration Progress to 45 minutes of aerobic exercise without signs/symptoms of physical distress   Intensity THRR unchanged     Progression   Progression  Continue to progress workloads to maintain intensity without signs/symptoms of physical distress.     Resistance Training   Training Prescription Yes   Weight orange bands   Reps 10-15   Time 10 Minutes     Interval Training   Interval Training No     Oxygen   Oxygen Continuous   Liters 2     NuStep   Level 2   Minutes 17   METs 1.9     Arm Ergometer   Level 3   Minutes 17     Track   Laps 11   Minutes 17      Nutrition:  Target Goals: Understanding of nutrition guidelines, daily intake of sodium <1559m, cholesterol <205m calories 30% from fat and 7% or less from saturated fats, daily to have 5 or more servings of fruits  and vegetables.  Biometrics:     Pre Biometrics - 11/09/16 1418      Pre Biometrics   Grip Strength 18 kg       Nutrition Therapy Plan and Nutrition Goals:   Nutrition Discharge: Rate Your Plate Scores:   Nutrition Goals Re-Evaluation:   Nutrition Goals Discharge (Final Nutrition Goals Re-Evaluation):   Psychosocial: Target Goals: Acknowledge presence or absence of significant depression and/or stress, maximize coping skills, provide positive support system. Participant is able to verbalize types and ability to use techniques and skills needed for reducing stress and depression.  Initial Review & Psychosocial Screening:     Initial Psych Review & Screening - 11/09/16 1436      Initial Review   Current issues with None Identified     Family Dynamics   Good Support System? Yes     Barriers   Psychosocial barriers to participate in program There are no identifiable barriers or psychosocial needs.      Quality of Life Scores:   PHQ-9: Recent Review Flowsheet Data    Depression screen [facility] 2/9 11/09/2016   Decreased Interest 0   Down, Depressed, Hopeless 0   PHQ - 2 Score 0     Interpretation of Total Score  Total Score Depression Severity:  1-4 = Minimal depression, 5-9 = Mild depression, 10-14 = Moderate depression,  15-19 = Moderately severe depression, 20-27 = Severe depression   Psychosocial Evaluation and Intervention:     Psychosocial Evaluation - 11/09/16 1438      Psychosocial Evaluation & Interventions   Interventions Encouraged to exercise with the program and follow exercise prescription   Continue Psychosocial Services  No Follow up required      Psychosocial Re-Evaluation:     Psychosocial Re-Evaluation    [place] Name 11/09/16 1439 12/04/16 0936           Psychosocial Re-Evaluation   Current issues with None Identified None Identified      Interventions Encouraged to attend Pulmonary Rehabilitation for the exercise Encouraged to attend Pulmonary Rehabilitation for the exercise      Continue Psychosocial Services  No Follow up required No Follow up required         Psychosocial Discharge (Final Psychosocial Re-Evaluation):     Psychosocial Re-Evaluation - 12/04/16 0936      Psychosocial Re-Evaluation   Current issues with None Identified   Interventions Encouraged to attend Pulmonary Rehabilitation for the exercise   Continue Psychosocial Services  No Follow up required      Education: Education Goals: Education classes will be provided on a weekly basis, covering required topics. Participant will state understanding/return demonstration of topics presented.  Learning Barriers/Preferences:     Learning Barriers/Preferences - 11/09/16 1430      Learning Barriers/Preferences   Learning Barriers None   Learning Preferences Group Instruction;Individual Instruction;Written Material;Skilled Demonstration      Education Topics: Risk Factor Reduction:  -Group instruction that is supported by a PowerPoint presentation. Instructor discusses the definition of a risk factor, different risk factors for pulmonary disease, and how the heart and lungs work together.     Nutrition for Pulmonary Patient:  -Group instruction provided by PowerPoint slides, verbal discussion, and  written materials to support subject matter. The instructor gives an explanation and review of healthy diet recommendations, which includes a discussion on weight management, recommendations for fruit and vegetable consumption, as well as protein, fluid, caffeine, fiber, sodium, sugar, and alcohol. Tips for eating when patients are short of  breath are discussed.   PULMONARY REHAB OTHER RESPIRATORY from 11/29/2016 in [place]  Date  11/22/16  Educator  RD  Instruction Review Code  2- meets goals/outcomes      Pursed Lip Breathing:  -Group instruction that is supported by demonstration and informational handouts. Instructor discusses the benefits of pursed lip and diaphragmatic breathing and detailed demonstration on how to preform both.     Oxygen Safety:  -Group instruction provided by PowerPoint, verbal discussion, and written material to support subject matter. There is an overview of "What is Oxygen" and "Why do we need it".  Instructor also reviews how to create a safe environment for oxygen use, the importance of using oxygen as prescribed, and the risks of noncompliance. There is a brief discussion on traveling with oxygen and resources the patient may utilize.   Oxygen Equipment:  -Group instruction provided by [facility] Staff utilizing handouts, written materials, and equipment demonstrations.   Signs and Symptoms:  -Group instruction provided by written material and verbal discussion to support subject matter. Warning signs and symptoms of infection, stroke, and heart attack are reviewed and when to call the physician/911 reinforced. Tips for preventing the spread of infection discussed.   Advanced Directives:  -Group instruction provided by verbal instruction and written material to support subject matter. Instructor reviews Advanced Directive laws and proper instruction for filling out document.   Pulmonary Video:  -Group video education that  reviews the importance of medication and oxygen compliance, exercise, good nutrition, pulmonary hygiene, and pursed lip and diaphragmatic breathing for the pulmonary patient.   Exercise for the Pulmonary Patient:  -Group instruction that is supported by a PowerPoint presentation. Instructor discusses benefits of exercise, core components of exercise, frequency, duration, and intensity of an exercise routine, importance of utilizing pulse oximetry during exercise, safety while exercising, and options of places to exercise outside of rehab.     PULMONARY REHAB OTHER RESPIRATORY from 11/29/2016 in [place]  Date  11/29/16  Educator  ep  Instruction Review Code  2- meets goals/outcomes      Pulmonary Medications:  -Verbally interactive group education provided by instructor with focus on inhaled medications and proper administration.   Anatomy and Physiology of the Respiratory System and Intimacy:  -Group instruction provided by PowerPoint, verbal discussion, and written material to support subject matter. Instructor reviews respiratory cycle and anatomical components of the respiratory system and their functions. Instructor also reviews differences in obstructive and restrictive respiratory diseases with examples of each. Intimacy, Sex, and Sexuality differences are reviewed with a discussion on how relationships can change when diagnosed with pulmonary disease. Common sexual concerns are reviewed.   MD DAY -A group question and answer session with a medical doctor that allows participants to ask questions that relate to their pulmonary disease state.   OTHER EDUCATION -Group or individual verbal, written, or video instructions that support the educational goals of the pulmonary rehab program.   Knowledge Questionnaire Score:   Core Components/Risk Factors/Patient Goals at Admission:     Personal Goals and Risk Factors at Admission - 11/09/16 1435       Core Components/Risk Factors/Patient Goals on Admission    Weight Management Yes;Obesity   Intervention Obesity: Provide education and appropriate resources to help participant work on and attain dietary goals.;Weight Management/Obesity: Establish reasonable short term and long term weight goals.;Weight Management: Provide education and appropriate resources to help participant work on and attain dietary goals.;Weight Management:  Develop a combined nutrition and exercise program designed to reach desired caloric intake, while maintaining appropriate intake of nutrient and fiber, sodium and fats, and appropriate energy expenditure required for the weight goal.   Admit Weight 214 lb 11.5 oz (97.4 kg)   Expected Outcomes Short Term: Continue to assess and modify interventions until short term weight is achieved;Weight Loss: Understanding of general recommendations for a balanced deficit meal plan, which promotes 1-2 lb weight loss per week and includes a negative energy balance of [phone] kcal/d;Understanding recommendations for meals to include 15-35% energy as protein, 25-35% energy from fat, 35-60% energy from carbohydrates, less than 249m of dietary cholesterol, 20-35 gm of total fiber daily;Understanding of distribution of calorie intake throughout the day with the consumption of 4-5 meals/snacks   Improve shortness of breath with ADL's Yes   Intervention Provide education, individualized exercise plan and daily activity instruction to help decrease symptoms of SOB with activities of daily living.   Expected Outcomes Short Term: Achieves a reduction of symptoms when performing activities of daily living.   Develop more efficient breathing techniques such as purse lipped breathing and diaphragmatic breathing; and practicing self-pacing with activity Yes   Intervention Provide education, demonstration and support about specific breathing techniuqes utilized for more efficient breathing. Include  techniques such as pursed lipped breathing, diaphragmatic breathing and self-pacing activity.   Expected Outcomes Short Term: Participant will be able to demonstrate and use breathing techniques as needed throughout daily activities.      Core Components/Risk Factors/Patient Goals Review:      Goals and Risk Factor Review    Row Name 12/04/16 0934             Core Components/Risk Factors/Patient Goals Review   Personal Goals Review Weight Management/Obesity;Improve shortness of breath with ADL's;Develop more efficient breathing techniques such as purse lipped breathing and diaphragmatic breathing and practicing self-pacing with activity.       Review No weight loss, has exercised in 3 exercise sessions, too early to see progress       Expected Outcomes expect progression towards goals in the next 30 days          Core Components/Risk Factors/Patient Goals at Discharge (Final Review):      Goals and Risk Factor Review - 12/04/16 0934      Core Components/Risk Factors/Patient Goals Review   Personal Goals Review Weight Management/Obesity;Improve shortness of breath with ADL's;Develop more efficient breathing techniques such as purse lipped breathing and diaphragmatic breathing and practicing self-pacing with activity.   Review No weight loss, has exercised in 3 exercise sessions, too early to see progress   Expected Outcomes expect progression towards goals in the next 30 days      ITP Comments:   Comments: ITP REVIEW Pt is making expected progress toward pulmonary rehab goals after completing 3 sessions. Recommend continued exercise, life style modification, education, and utilization of breathing techniques to increase stamina and strength and decrease shortness of breath with exertion.

## 2016-12-06 ENCOUNTER — Encounter (HOSPITAL_COMMUNITY)
Admission: RE | Admit: 2016-12-06 | Discharge: 2016-12-06 | Disposition: A | Discharge status: Home / Self Care | Payer: 59 | Source: Ambulatory Visit | Attending: Internal Medicine | Admitting: Internal Medicine

## 2016-12-06 DIAGNOSIS — I272 Pulmonary hypertension, unspecified: Secondary | ICD-10-CM

## 2016-12-11 ENCOUNTER — Encounter (HOSPITAL_COMMUNITY)
Admission: RE | Admit: 2016-12-11 | Discharge: 2016-12-11 | Disposition: A | Discharge status: Home / Self Care | Payer: 59 | Source: Ambulatory Visit | Attending: Internal Medicine | Admitting: Internal Medicine

## 2016-12-11 DIAGNOSIS — I272 Pulmonary hypertension, unspecified: Secondary | ICD-10-CM

## 2016-12-11 NOTE — Progress Notes (Signed)
Daily Session Note  Patient Details  Name: [name] MRN: [id number] Date of Birth: 02/04/1936 Referring Provider:     Pulmonary Rehab Walk Test from 11/13/2016 in [place]  Referring Provider  Dr. Haroldine Laws      Encounter Date: 12/11/2016  Check In:     Session Check In - 12/11/16 1330      Check-In   Location [facility]   Staff Present Rosebud Poles, RN, BSN;Lisa Ysidro Evert, RN;Portia Rollene Rotunda, RN, BSN   Supervising physician immediately available to respond to emergencies [facility] immediately available   Physician(s) Dr. Doyle Askew   Medication changes reported     No   Fall or balance concerns reported    No   Tobacco Cessation No Change   Warm-up and Cool-down Performed as group-led instruction   Resistance Training Performed Yes   VAD Patient? No     Pain Assessment   Currently in Pain? No/denies   Multiple Pain Sites No      Capillary Blood Glucose: No results found for this or any previous visit (from the past 24 hour(s)).     POCT Glucose - 12/11/16 1518      POCT Blood Glucose   Pre-Exercise 103 mg/dL  given lemonade pre exercise   Post-Exercise 117 mg/dL         Exercise Prescription Changes - 12/11/16 1500      Response to Exercise   Blood Pressure (Admit) 104/60   Blood Pressure (Exercise) 100/54   Blood Pressure (Exit) 104/54   Heart Rate (Admit) 60 bpm   Heart Rate (Exercise) 67 bpm   Heart Rate (Exit) 61 bpm   Oxygen Saturation (Admit) 100 %   Oxygen Saturation (Exercise) 99 %   Oxygen Saturation (Exit) 100 %   Rating of Perceived Exertion (Exercise) 11   Perceived Dyspnea (Exercise) 0   Duration Progress to 45 minutes of aerobic exercise without signs/symptoms of physical distress   Intensity THRR unchanged     Progression   Progression Continue to progress workloads to maintain intensity without signs/symptoms of physical distress.     Resistance Training   Training Prescription  Yes   Weight orange bands   Reps 10-15   Time 10 Minutes     Interval Training   Interval Training No     Oxygen   Oxygen Continuous   Liters 2     NuStep   Level 2   Minutes 17   METs 1.5     Arm Ergometer   Level 2  reduced workload d/t shoulder pinched nerve   Minutes 17     Track   Laps 8   Minutes 17      History  Smoking Status  . Never Smoker  Smokeless Tobacco  . Never Used    Goals Met:  Exercise tolerated well Strength training completed today  Goals Unmet:  Not Applicable  Comments: Service time is from 1330 to 1455    Dr. Rush Farmer is Medical Director for Pulmonary Rehab at [facility].

## 2016-12-13 ENCOUNTER — Encounter (HOSPITAL_COMMUNITY)
Admission: RE | Admit: 2016-12-13 | Discharge: 2016-12-13 | Disposition: A | Discharge status: Home / Self Care | Payer: 59 | Source: Ambulatory Visit | Attending: Internal Medicine | Admitting: Internal Medicine

## 2016-12-13 VITALS — Wt 217.6 lb

## 2016-12-13 DIAGNOSIS — I272 Pulmonary hypertension, unspecified: Secondary | ICD-10-CM | POA: Diagnosis not present

## 2016-12-13 NOTE — Progress Notes (Signed)
Daily Session Note  Patient Details  Name: ELIANAH KARIS MRN: [id number] Date of Birth: 1935/07/11 Referring Provider:     Pulmonary Rehab Walk Test from 11/13/2016 in [place]  Referring Provider  Dr. Haroldine Laws      Encounter Date: 12/13/2016  Check In:     Session Check In - 12/13/16 1349      Check-In   [facility]   Staff Present Trish Fountain, RN, Maxcine Ham, RN, Roque Cash, RN   Supervising physician immediately available to respond to emergencies [facility] immediately available   Physician(s) Dr. Maryland Pink   Medication changes reported     No   Tobacco Cessation No Change   Warm-up and Cool-down Performed as group-led instruction   Resistance Training Performed Yes   VAD Patient? No     Pain Assessment   Currently in Pain? No/denies   Multiple Pain Sites No      Capillary Blood Glucose: No results found for this or any previous visit (from the past 24 hour(s)).      Exercise Prescription Changes - 12/13/16 1500      Response to Exercise   Blood Pressure (Admit) 110/60   Blood Pressure (Exercise) 118/50   Blood Pressure (Exit) 108/70   Heart Rate (Admit) 58 bpm   Heart Rate (Exercise) 80 bpm   Heart Rate (Exit) 60 bpm   Oxygen Saturation (Admit) 100 %   Oxygen Saturation (Exercise) 99 %   Oxygen Saturation (Exit) 100 %   Rating of Perceived Exertion (Exercise) 13   Perceived Dyspnea (Exercise) 2   Duration Progress to 45 minutes of aerobic exercise without signs/symptoms of physical distress   Intensity THRR unchanged     Progression   Progression Continue to progress workloads to maintain intensity without signs/symptoms of physical distress.     Resistance Training   Training Prescription Yes   Weight orange bands   Reps 10-15   Time 10 Minutes     Interval Training   Interval Training No     Oxygen   Oxygen Continuous   Liters 2     NuStep   Level 2   Minutes  17   METs 1.7     Arm Ergometer   Level 2   Minutes 17      History  Smoking Status  . Never Smoker  Smokeless Tobacco  . Never Used    Goals Met:  No report of cardiac concerns or symptoms Strength training completed today  Goals Unmet:  Not Applicable  Comments: Service time is from 1330 to 1510    Dr. Rush Farmer is Medical Director for Pulmonary Rehab at [facility].

## 2016-12-17 NOTE — Progress Notes (Signed)
[name] 81 y.o. female             Nutrition Screen & Note No diagnosis found. Past Medical History:  Diagnosis Date  . Anemia    takes iron  . Arthritis    Dr Ronnie Derby  . Carotid artery stenosis    1-39% bilateral dopplers 08/2015  . Cataracts, bilateral   . Chronic kidney disease    stage III Dr Mercy Moore  . Diabetes mellitus   . GERD (gastroesophageal reflux disease)   . Heart murmur    Dr Irish Lack  . HTN (hypertension)   . Hyperlipidemia   . Morbid obesity with BMI of 40.0-44.9, adult ([name])   . Obstructive sleep apnea (adult) (pediatric)    w CPAP Severe OSA AHI 52/hr CPAP at 10cm H2O  . Pulmonary HTN ([name])    mild with PASP 1mmHg echo 02/2014  . Right carotid bruit 08/19/2014  . Shortness of breath    resolved with CPAP  . Urination frequency   . Vitamin D deficiency disease   . Wears dentures    upper  . Wears glasses    Meds reviewed. Actos noted Ht: Ht Readings from Last 1 Encounters:  11/09/16 5' (1.524 m)    Wt:  Wt Readings from Last 3 Encounters:  12/13/16 217 lb 9.5 oz (98.7 kg)  11/29/16 219 lb 9.3 oz (99.6 kg)  11/27/16 214 lb 15.2 oz (97.5 kg)    BMI: 42.1    Current tobacco use? No  Labs:  Lipid Panel  No results found for: CHOL, TRIG, HDL, CHOLHDL, VLDL, LDLCALC, LDLDIRECT  Lab Results  Component Value Date   HGBA1C 6.4 (H) 10/04/2015   Nutrition Diagnosis ? Food-and nutrition-related knowledge deficit related to lack of exposure to information as related to diagnosis of pulmonary disease ? Obesity related to excessive energy intake as evidenced by a BMI of 42.1  Goal(s) 1. Identify food quantities necessary to achieve wt loss of  -2# per week to a goal wt loss of 2.7-10.9 kg (6-24 lb) at graduation from pulmonary rehab. Plan:  Pt to attend Pulmonary Nutrition class Will provide client-centered nutrition education as part of interdisciplinary care.   Monitor and evaluate progress toward nutrition goal with team.  Monitor and  Evaluate progress toward nutrition goal with team.   Derek Mound, M.Ed, RD, LDN, CDE 12/17/2016 9:47 AM

## 2016-12-18 ENCOUNTER — Encounter (HOSPITAL_COMMUNITY)
Admission: RE | Admit: 2016-12-18 | Discharge: 2016-12-18 | Disposition: A | Discharge status: Home / Self Care | Payer: 59 | Source: Ambulatory Visit | Attending: Internal Medicine | Admitting: Internal Medicine

## 2016-12-18 VITALS — Wt 215.2 lb

## 2016-12-18 DIAGNOSIS — I272 Pulmonary hypertension, unspecified: Secondary | ICD-10-CM

## 2016-12-18 NOTE — Progress Notes (Signed)
Daily Session Note  Patient Details  Name: [name] MRN: [id number] Date of Birth: 1935-11-03 Referring Provider:     Pulmonary Rehab Walk Test from 11/13/2016 in [place]  Referring Provider  Dr. Haroldine Laws      Encounter Date: 12/18/2016  Check In:     Session Check In - 12/18/16 1330      Check-In   [facility]   Staff Present Rosebud Poles, RN, BSN;Molly diVincenzo, MS, ACSM RCEP, Exercise Physiologist;Lisa Ysidro Evert, RN;Portia Rollene Rotunda, RN, BSN   Supervising physician immediately available to respond to emergencies [facility] immediately available   Physician(s) Dr. Maryland Pink   Medication changes reported     No   Fall or balance concerns reported    No   Tobacco Cessation No Change   Warm-up and Cool-down Performed as group-led instruction   Resistance Training Performed Yes   VAD Patient? No     Pain Assessment   Currently in Pain? No/denies   Multiple Pain Sites No      Capillary Blood Glucose: No results found for this or any previous visit (from the past 24 hour(s)).      Exercise Prescription Changes - 12/18/16 1500      Response to Exercise   Blood Pressure (Admit) 104/60   Blood Pressure (Exercise) 130/64   Blood Pressure (Exit) 98/60   Heart Rate (Admit) 59 bpm   Heart Rate (Exercise) 64 bpm   Heart Rate (Exit) 61 bpm   Oxygen Saturation (Admit) 98 %   Oxygen Saturation (Exercise) 99 %   Oxygen Saturation (Exit) 98 %   Rating of Perceived Exertion (Exercise) 11   Perceived Dyspnea (Exercise) 1   Duration Progress to 45 minutes of aerobic exercise without signs/symptoms of physical distress   Intensity THRR unchanged     Progression   Progression Continue to progress workloads to maintain intensity without signs/symptoms of physical distress.     Resistance Training   Training Prescription Yes   Weight orange bands   Reps 10-15   Time 10 Minutes     Interval Training   Interval Training No     Oxygen   Oxygen Continuous   Liters 2     NuStep   Level 2   Minutes 17   METs 1.7     Arm Ergometer   Level 2   Minutes 17     Track   Laps 8   Minutes 17      History  Smoking Status  . Never Smoker  Smokeless Tobacco  . Never Used    Goals Met:  Exercise tolerated well Strength training completed today  Goals Unmet:  Not Applicable  Comments: Service time is from 1330 to 1455    Dr. Rush Farmer is Medical Director for Pulmonary Rehab at [facility].

## 2016-12-18 NOTE — Progress Notes (Addendum)
[name] Stamp 81 y.o. female Nutrition Note Spoke with pt. Pt is obese. Pt wants to lose wt but is not actively trying to lose wt at this time. Pt eats 3 meals a day.  Making healthy food choices the majority of the time.  Pt's Rate Your Plate results reviewed with pt. Pt is diabetic.  Pt checks CBG's once daily.  Fasting CBG's reportedly 110-115 mg/dL. Pt c/o having to eat a snack before exercise despite eating lunch. No recent A1c noted. Pt reports her last A1c was "6-something." Pt expressed understanding of the information reviewed via feedback method.    Lab Results  Component Value Date   HGBA1C 6.4 (H) 10/04/2015   Nutrition Intervention ? Pt's individual nutrition plan and goals reviewed with pt. ? Benefits of adopting healthy eating habits discussed when pt's Rate Your Plate reviewed. ? Will fax Dr. Drema Dallas re: ? If pt may benefit from either holding Actos on exercise days or decreasing Actos to 15 mg/d rather than 30 mg/d.  ? Pt to attend the Nutrition and Lung Disease class ? Continual client-centered nutrition education by RD, as part of interdisciplinary care.  Nutrition Diagnosis ? Food-and nutrition-related knowledge deficit related to lack of exposure to information as related to diagnosis of pulmonary disease ? Obesity related to excessive energy intake as evidenced by a BMI of 42.1  Goal(s) 1. Identify food quantities necessary to achieve wt loss of  -2# per week to a goal wt loss of 2.7-10.9 kg (6-24 lb) at graduation from pulmonary rehab. Plan:  Pt to attend Pulmonary Nutrition class Will provide client-centered nutrition education as part of interdisciplinary care.   Monitor and evaluate progress toward nutrition goal with team.  Monitor and Evaluate progress toward nutrition goal with team.   Derek Mound, M.Ed, RD, LDN, CDE 12/18/2016 3:10 PM

## 2016-12-19 DIAGNOSIS — M50322 Other cervical disc degeneration at C5-C6 level: Secondary | ICD-10-CM | POA: Diagnosis not present

## 2016-12-19 DIAGNOSIS — M5136 Other intervertebral disc degeneration, lumbar region: Secondary | ICD-10-CM | POA: Diagnosis not present

## 2016-12-19 DIAGNOSIS — M9903 Segmental and somatic dysfunction of lumbar region: Secondary | ICD-10-CM | POA: Diagnosis not present

## 2016-12-19 DIAGNOSIS — M9901 Segmental and somatic dysfunction of cervical region: Secondary | ICD-10-CM | POA: Diagnosis not present

## 2016-12-20 ENCOUNTER — Encounter (HOSPITAL_COMMUNITY)
Admission: RE | Admit: 2016-12-20 | Discharge: 2016-12-20 | Disposition: A | Discharge status: Home / Self Care | Payer: 59 | Source: Ambulatory Visit | Attending: Internal Medicine | Admitting: Internal Medicine

## 2016-12-20 DIAGNOSIS — I272 Pulmonary hypertension, unspecified: Secondary | ICD-10-CM

## 2016-12-24 DIAGNOSIS — E559 Vitamin D deficiency, unspecified: Secondary | ICD-10-CM | POA: Diagnosis not present

## 2016-12-24 DIAGNOSIS — Z7984 Long term (current) use of oral hypoglycemic drugs: Secondary | ICD-10-CM | POA: Diagnosis not present

## 2016-12-24 DIAGNOSIS — N183 Chronic kidney disease, stage 3 (moderate): Secondary | ICD-10-CM | POA: Diagnosis not present

## 2016-12-24 DIAGNOSIS — E7800 Pure hypercholesterolemia, unspecified: Secondary | ICD-10-CM | POA: Diagnosis not present

## 2016-12-24 DIAGNOSIS — E1129 Type 2 diabetes mellitus with other diabetic kidney complication: Secondary | ICD-10-CM | POA: Diagnosis not present

## 2016-12-24 DIAGNOSIS — N6459 Other signs and symptoms in breast: Secondary | ICD-10-CM | POA: Diagnosis not present

## 2016-12-24 DIAGNOSIS — D509 Iron deficiency anemia, unspecified: Secondary | ICD-10-CM | POA: Diagnosis not present

## 2016-12-25 ENCOUNTER — Other Ambulatory Visit: Payer: Self-pay | Admitting: Family Medicine

## 2016-12-25 ENCOUNTER — Encounter (HOSPITAL_COMMUNITY)
Admission: RE | Admit: 2016-12-25 | Discharge: 2016-12-25 | Disposition: A | Discharge status: Home / Self Care | Payer: 59 | Source: Ambulatory Visit | Attending: Internal Medicine | Admitting: Internal Medicine

## 2016-12-25 VITALS — Wt 215.4 lb

## 2016-12-25 DIAGNOSIS — I272 Pulmonary hypertension, unspecified: Secondary | ICD-10-CM | POA: Diagnosis not present

## 2016-12-25 DIAGNOSIS — Q839 Congenital malformation of breast, unspecified: Secondary | ICD-10-CM

## 2016-12-25 NOTE — Progress Notes (Signed)
Daily Session Note  Patient Details  Name: [name] MRN: [id number] Date of Birth: 12/05/1935 Referring Provider:     Pulmonary Rehab Walk Test from 11/13/2016 in [facility]  Referring Provider  Dr. Bensimhon      Encounter Date: 12/25/2016  Check In:     Session Check In - 12/25/16 1330      Check-In   [facility]   Staff Present Joan Behrens, RN, BSN;Lisa Hughes, RN;Olinty Richards, MS, ACSM CEP, Exercise Physiologist;Annedrea Stackhouse, RN, MHA   Supervising physician immediately available to respond to emergencies [facility] immediately available   Physician(s) Dr. Sabia   Medication changes reported     No   Fall or balance concerns reported    No   Tobacco Cessation No Change   Warm-up and Cool-down Performed as group-led instruction   Resistance Training Performed Yes   VAD Patient? No     Pain Assessment   Currently in Pain? No/denies   Multiple Pain Sites No      Capillary Blood Glucose: No results found for this or any previous visit (from the past 24 hour(s)).     POCT Glucose - 12/25/16 1554      POCT Blood Glucose   Pre-Exercise 98 mg/dL  ate a banana   Post-Exercise 104 mg/dL         Exercise Prescription Changes - 12/25/16 1500      Response to Exercise   Blood Pressure (Admit) 100/70   Blood Pressure (Exercise) 96/54   Blood Pressure (Exit) 106/60   Heart Rate (Admit) 57 bpm   Heart Rate (Exercise) 68 bpm   Heart Rate (Exit) 60 bpm   Oxygen Saturation (Admit) 99 %   Oxygen Saturation (Exercise) 100 %   Oxygen Saturation (Exit) 99 %   Rating of Perceived Exertion (Exercise) 11   Perceived Dyspnea (Exercise) 1   Duration Progress to 45 minutes of aerobic exercise without signs/symptoms of physical distress   Intensity THRR unchanged     Progression   Progression Continue to progress workloads to maintain intensity without signs/symptoms of physical distress.     Resistance Training   Training Prescription Yes   Weight orange bands   Reps 10-15   Time 10 Minutes     Interval Training   Interval Training No     Oxygen   Oxygen Continuous   Liters 2     NuStep   Level 2   Minutes 17   METs 1.7     Arm Ergometer   Level 3   Minutes 17     Track   Laps 8   Minutes 17      History  Smoking Status  . Never Smoker  Smokeless Tobacco  . Never Used    Goals Met:  Exercise tolerated well Strength training completed today  Goals Unmet:  Not Applicable  Comments: Service time is from 1330 to 1505    Dr. Wesam G. Yacoub is Medical Director for Pulmonary Rehab at [facility]. 

## 2016-12-27 ENCOUNTER — Encounter (HOSPITAL_COMMUNITY)
Admission: RE | Admit: 2016-12-27 | Discharge: 2016-12-27 | Disposition: A | Discharge status: Home / Self Care | Payer: 59 | Source: Ambulatory Visit | Attending: Internal Medicine | Admitting: Internal Medicine

## 2016-12-27 VITALS — Wt 214.9 lb

## 2016-12-27 DIAGNOSIS — I272 Pulmonary hypertension, unspecified: Secondary | ICD-10-CM

## 2016-12-27 NOTE — Progress Notes (Signed)
Daily Session Note  Patient Details  Name: [name] MRN: [id number] Date of Birth: 12/15/1935 Referring Provider:     Pulmonary Rehab Walk Test from 11/13/2016 in [place]  Referring Provider  Dr. Haroldine Laws      Encounter Date: 12/27/2016  Check In:     Session Check In - 12/27/16 1330      Check-In   Location [facility]   Staff Present Rosebud Poles, RN, BSN;Destry Dauber, MS, ACSM RCEP, Exercise Physiologist;Lisa Ysidro Evert, RN   Supervising physician immediately available to respond to emergencies [facility] immediately available   Physician(s) Dr. Maryland Pink   Medication changes reported     No   Fall or balance concerns reported    No   Tobacco Cessation No Change   Warm-up and Cool-down Performed as group-led instruction   Resistance Training Performed Yes   VAD Patient? No     Pain Assessment   Currently in Pain? No/denies   Multiple Pain Sites No      Capillary Blood Glucose: No results found for this or any previous visit (from the past 24 hour(s)).      Exercise Prescription Changes - 12/27/16 1500      Response to Exercise   Blood Pressure (Admit) 98/60   Blood Pressure (Exercise) 94/64   Blood Pressure (Exit) 98/60   Heart Rate (Admit) 56 bpm   Heart Rate (Exercise) 72 bpm   Heart Rate (Exit) 58 bpm   Oxygen Saturation (Admit) 100 %   Oxygen Saturation (Exercise) 100 %   Oxygen Saturation (Exit) 100 %   Rating of Perceived Exertion (Exercise) 13   Perceived Dyspnea (Exercise) 0   Duration Progress to 45 minutes of aerobic exercise without signs/symptoms of physical distress   Intensity THRR unchanged     Progression   Progression Continue to progress workloads to maintain intensity without signs/symptoms of physical distress.     Resistance Training   Training Prescription Yes   Weight orange bands   Reps 10-15   Time 10 Minutes     Interval Training   Interval Training No     Oxygen   Oxygen Continuous   Liters 2     Arm Ergometer   Level 3   Minutes 17     Track   Laps 9   Minutes 17      History  Smoking Status  . Never Smoker  Smokeless Tobacco  . Never Used    Goals Met:  Exercise tolerated well No report of cardiac concerns or symptoms Strength training completed today  Goals Unmet:  Not Applicable  Comments: Service time is from 1:30p to 3:00p    Dr. Rush Farmer is Medical Director for Pulmonary Rehab at [facility].

## 2016-12-31 ENCOUNTER — Encounter (HOSPITAL_COMMUNITY): Payer: 59 | Admitting: Internal Medicine

## 2017-01-01 ENCOUNTER — Encounter (HOSPITAL_COMMUNITY)
Admission: RE | Admit: 2017-01-01 | Discharge: 2017-01-01 | Disposition: A | Discharge status: Home / Self Care | Payer: 59 | Source: Ambulatory Visit | Attending: Internal Medicine | Admitting: Internal Medicine

## 2017-01-01 VITALS — Wt 213.4 lb

## 2017-01-01 DIAGNOSIS — I272 Pulmonary hypertension, unspecified: Secondary | ICD-10-CM | POA: Diagnosis not present

## 2017-01-01 NOTE — Progress Notes (Signed)
[facility] Individual Treatment Plan  Patient Details  Name: [name] MRN: [id number] Date of Birth: Oct 19, 1935 Referring Provider:     Pulmonary [facility] Walk Test from 11/13/2016 in [name]  Referring Provider  Dr. Haroldine Laws      Initial Encounter Date:    [name] Walk Test from 11/13/2016 in [name]  Date  11/13/16  Referring Provider  Dr. Haroldine Laws      Visit Diagnosis: Tiffany hypertension ([name])  Patient's Home Medications on Admission:   Current Outpatient Prescriptions:  .  acetaminophen (TYLENOL) 500 MG tablet, Take 500 mg by mouth every 8 (eight) hours as needed for mild pain, moderate pain, fever or headache. , Disp: , Rfl:  .  amLODipine (NORVASC) 10 MG tablet, Take 10 mg by mouth daily., Disp: , Rfl: 6 .  aspirin EC 81 MG tablet, Take 81 mg by mouth daily., Disp: , Rfl:  .  atorvastatin (LIPITOR) 40 MG tablet, Take 40 mg by mouth at bedtime., Disp: , Rfl:  .  Cholecalciferol (VITAMIN D) 2000 UNITS CAPS, Take 2,000 Units by mouth daily., Disp: , Rfl:  .  docusate sodium (COLACE) 100 MG capsule, Take 100 mg by mouth daily as needed for mild constipation., Disp: , Rfl:  .  esomeprazole (NEXIUM) 40 MG capsule, Take 40 mg by mouth daily. , Disp: , Rfl:  .  ezetimibe (ZETIA) 10 MG tablet, Take 10 mg by mouth at bedtime., Disp: , Rfl:  .  furosemide (LASIX) 40 MG tablet, Take 40 mg by mouth daily. , Disp: , Rfl:  .  Multiple Vitamins-Minerals (MULTIVITAMINS THER. W/MINERALS) TABS, Take 1 tablet by mouth daily., Disp: , Rfl:  .  pioglitazone (ACTOS) 30 MG tablet, Take 30 mg by mouth daily., Disp: , Rfl:  .  spironolactone (ALDACTONE) 25 MG tablet, Take 0.5 tablets (12.5 mg total) by mouth daily., Disp: 15 tablet, Rfl: 3 .  valsartan (DIOVAN) 320 MG tablet, Take 320 mg by mouth daily., Disp: , Rfl:   Past Medical History: Past Medical History:  Diagnosis Date  . Anemia    takes iron  .  Arthritis    Dr Ronnie Derby  . Carotid artery stenosis    1-39% bilateral dopplers 08/2015  . Cataracts, bilateral   . Chronic kidney disease    stage III Dr Mercy Moore  . Diabetes mellitus   . GERD (gastroesophageal reflux disease)   . Heart murmur    Dr Irish Lack  . HTN (hypertension)   . Hyperlipidemia   . Morbid obesity with BMI of 40.0-44.9, adult ([name])   . Obstructive sleep apnea (adult) (pediatric)    w CPAP Severe OSA AHI 52/hr CPAP at 10cm H2O  . Tiffany HTN ([name])    mild with PASP 49mHg echo 02/2014  . Right carotid bruit 08/19/2014  . Shortness of breath    resolved with CPAP  . Urination frequency   . Vitamin D deficiency disease   . Wears dentures    upper  . Wears glasses     Tobacco Use: History  Smoking Status  . Never Smoker  Smokeless Tobacco  . Never Used    Labs: Recent Review Flowsheet Data    Labs for ITP Cardiac and [name] Latest Ref Rng & Units 05/02/2010 10/04/2015   Hemoglobin A1c 4.8 - 5.6 % 6.4 (NOTE)  According to the ADA Clinical Practice Recommendations for 2011, when HbA1c is used as a screening test:   >=6.5%   Diagnostic of Diabetes Mellitus           (if abnormal result is confirmed)  5.7-6.4%   Increased risk of developing Diabetes Mellitus  References:Diagnosis and Classification of Diabetes Mellitus,Diabetes Care,2011,34(Suppl 1):S62-S69 and Standards of Medical Care in         Diabetes - 2011,Diabetes Care,2011,34 (Suppl 1):S11-S61.(H) 6.4(H)      Capillary Blood Glucose: Lab Results  Component Value Date   GLUCAP 106 (H) 11/22/2016   GLUCAP 115 (H) 11/22/2016   GLUCAP 117 (H) 11/20/2016   GLUCAP 115 (H) 11/20/2016   GLUCAP 150 (H) 10/05/2015       POCT Glucose    Row Name 11/22/16 1534 11/27/16 1621 12/11/16 1518 12/13/16 1542 12/25/16 1554     POCT Blood Glucose   Pre-Exercise 115 mg/dL 300 mg/dL 511 mg/dL  given lemonade pre exercise 121 mg/dL 98  mg/dL  ate a banana   Post-Exercise 104 mg/dL 021 mg/dL 117 mg/dL 356 mg/dL 701 mg/dL      ADL UCSD:     Tiffany Assessment Scores    Row Name 12/04/16 1549         ADL UCSD   ADL Phase Entry     SOB Score total 8       CAT Score   CAT Score 4  Entry        Tiffany Function Assessment:     Tiffany Function Assessment - 11/09/16 1430      Breath   Bilateral Breath Sounds Clear   Shortness of Breath Yes;Limiting activity      Exercise Target Goals:    Exercise Program Goal: Individual exercise prescription set with THRR, safety & activity barriers. Participant demonstrates ability to understand and report RPE using BORG scale, to self-measure pulse accurately, and to acknowledge the importance of the exercise prescription.  Exercise Prescription Goal: Starting with aerobic activity 30 plus minutes a day, 3 days per week for initial exercise prescription. Provide home exercise prescription and guidelines that participant acknowledges understanding prior to discharge.  Activity Barriers & Risk Stratification:     Activity Barriers & Cardiac Risk Stratification - 11/09/16 1417      Activity Barriers & Cardiac Risk Stratification   Activity Barriers Deconditioning;Shortness of Breath;Arthritis;Left Knee Replacement;Right Knee Replacement;Assistive Device;Balance Concerns;Neck/Spine Problems      6 Minute Walk:     6 Minute Walk    Row Name 11/13/16 1651         6 Minute Walk   Phase Discharge     Distance 1163 feet     Walk Time 6 minutes     # of Rest Breaks 0     MPH 2.2     METS 2.53     RPE 15     Perceived Dyspnea  3     Symptoms Yes (comment)     Comments wheelchair     Resting HR 62 bpm     Resting BP 148/75     Max Ex. HR 105 bpm     Max Ex. BP 189/74     2 Minute Post BP 149/83       Interval HR   Baseline HR 62     1 Minute HR 64     2 Minute HR 62     3 Minute HR 63     4 Minute HR  82     5 Minute HR 104     6 Minute HR  105     2 Minute Post HR 96     Interval Heart Rate? Yes       Interval Oxygen   Interval Oxygen? Yes     Baseline Oxygen Saturation % 100 %     Baseline Liters of Oxygen 2 L     1 Minute Oxygen Saturation % 97 %     1 Minute Liters of Oxygen 2 L     2 Minute Oxygen Saturation % 90 %     2 Minute Liters of Oxygen 2 L     3 Minute Oxygen Saturation % 98 %     3 Minute Liters of Oxygen 2 L     4 Minute Oxygen Saturation % 98 %     4 Minute Liters of Oxygen 2 L     5 Minute Oxygen Saturation % 100 %     5 Minute Liters of Oxygen 2 L     6 Minute Oxygen Saturation % 98 %     6 Minute Liters of Oxygen 2 L     2 Minute Post Oxygen Saturation % 100 %     2 Minute Post Liters of Oxygen 2 L        Oxygen Initial Assessment:     Oxygen Initial Assessment - 12/03/16 1142      Initial 6 min Walk   Oxygen Used Continuous;E-Tanks   Liters per minute 1  1-2 liters      Oxygen Re-Evaluation:     Oxygen Re-Evaluation    Row Name 12/04/16 0930 12/04/16 0931 12/06/16 0858 12/06/16 0900 01/01/17 1642     Program Oxygen Prescription   Program Oxygen Prescription Continuous  - Continuous  - Continuous   Liters per minute 2  - 2  - 2     Home Oxygen   Home Oxygen Device Home Concentrator;E-Tanks  - Home Concentrator;E-Tanks  - Home Concentrator;E-Tanks   Sleep Oxygen Prescription CPAP  -  - CPAP CPAP   Home Exercise Oxygen Prescription Continuous Continuous Continuous  - Continuous   Liters per minute  - 2 2  - 2   Home at Rest Exercise Oxygen Prescription  - Continuous Continuous  - Continuous   Liters per minute  - 2 2  - 2   Compliance with Home Oxygen Use  - Yes Yes  - No     Goals/Expected Outcomes   Short Term Goals  - To learn and exhibit compliance with exercise, home and travel O2 prescription;To learn and understand importance of monitoring SPO2 with pulse oximeter and demonstrate accurate use of the pulse oximeter.;To Learn and understand importance of maintaining oxygen  saturations>88%;To learn and demonstrate proper purse lipped breathing techniques or other breathing techniques.  - To learn and exhibit compliance with exercise, home and travel O2 prescription To learn and exhibit compliance with exercise, home and travel O2 prescription;To learn and understand importance of monitoring SPO2 with pulse oximeter and demonstrate accurate use of the pulse oximeter.;To Learn and understand importance of maintaining oxygen saturations>88%;To learn and demonstrate proper purse lipped breathing techniques or other breathing techniques.;To learn and demonstrate proper use of respiratory medications   Long  Term Goals  - Exhibits compliance with exercise, home and travel O2 prescription  - Exhibits compliance with exercise, home and travel O2 prescription Exhibits compliance with exercise, home and travel O2 prescription;Verbalizes importance of monitoring  SPO2 with pulse oximeter and return demonstration;Maintenance of O2 saturations>88%;Exhibits proper breathing techniques, such as purse lipped breathing or other method taught during program session   Comments  - Is compliant with oxygen at this time  - Is compliant with oxygen at this time pt remains compliant with her home oxygen prescription accept when at work. her portable tanks are too heavy for her to carry. she is working with [facility] to obtain a poc.   Goals/Expected Outcomes  - continued compliance  - continued compliance she will verbalize compliance with O2      Oxygen Discharge (Final Oxygen Re-Evaluation):     Oxygen Re-Evaluation - 01/01/17 1642      Program Oxygen Prescription   Program Oxygen Prescription Continuous   Liters per minute 2     Home Oxygen   Home Oxygen Device Home Concentrator;E-Tanks   Sleep Oxygen Prescription CPAP   Home Exercise Oxygen Prescription Continuous   Liters per minute 2   Home at Rest Exercise Oxygen Prescription Continuous   Liters per minute 2   Compliance with  Home Oxygen Use No     Goals/Expected Outcomes   Short Term Goals To learn and exhibit compliance with exercise, home and travel O2 prescription;To learn and understand importance of monitoring SPO2 with pulse oximeter and demonstrate accurate use of the pulse oximeter.;To Learn and understand importance of maintaining oxygen saturations>88%;To learn and demonstrate proper purse lipped breathing techniques or other breathing techniques.;To learn and demonstrate proper use of respiratory medications   Long  Term Goals Exhibits compliance with exercise, home and travel O2 prescription;Verbalizes importance of monitoring SPO2 with pulse oximeter and return demonstration;Maintenance of O2 saturations>88%;Exhibits proper breathing techniques, such as purse lipped breathing or other method taught during program session   Comments pt remains compliant with her home oxygen prescription accept when at work. her portable tanks are too heavy for her to carry. she is working with [facility] to obtain a poc.   Goals/Expected Outcomes she will verbalize compliance with O2      Initial Exercise Prescription:     Initial Exercise Prescription - 11/13/16 1600      Date of Initial Exercise RX and Referring Provider   Date 11/13/16   Referring Provider Dr. Haroldine Laws     Oxygen   Oxygen Continuous   Liters 2     NuStep   Level 2   Minutes 17   METs 1.5     Arm Ergometer   Level 2   Minutes 17   METs 1.5     Track   Laps 7   Minutes 17     Prescription Details   Frequency (times per week) 2   Duration Progress to 45 minutes of aerobic exercise without signs/symptoms of physical distress     Intensity   THRR 40-80% of Max Heartrate 57-114   Ratings of Perceived Exertion 11-13   Perceived Dyspnea 0-4     Progression   Progression Continue progressive overload as per policy without signs/symptoms or physical distress.     Resistance Training   Training Prescription Yes   Weight orange bands    Reps 10-15      Perform Capillary Blood Glucose checks as needed.  Exercise Prescription Changes:     Exercise Prescription Changes    Row Name 11/20/16 1500 11/22/16 1532 11/27/16 1600 12/11/16 1500 12/13/16 1500     Response to Exercise   Blood Pressure (Admit) 122/56 108/58 114/56 104/60 110/60   Blood Pressure (  Exercise) 136/70 106/60 110/60 100/54 118/50   Blood Pressure (Exit) 120/56 102/60 100/60 104/54 108/70   Heart Rate (Admit) 62 bpm 57 bpm 59 bpm 60 bpm 58 bpm   Heart Rate (Exercise) 85 bpm 72 bpm 73 bpm 67 bpm 80 bpm   Heart Rate (Exit) 62 bpm 64 bpm 59 bpm 61 bpm 60 bpm   Oxygen Saturation (Admit) 97 % 97 % 99 % 100 % 100 %   Oxygen Saturation (Exercise) 100 % 95 % 98 % 99 % 99 %   Oxygen Saturation (Exit) 99 % 94 % 100 % 100 % 100 %   Rating of Perceived Exertion (Exercise) '13 13 13 11 13   '$ Perceived Dyspnea (Exercise) '2 1 1 '$ 0 2   Duration Progress to 45 minutes of aerobic exercise without signs/symptoms of physical distress Progress to 45 minutes of aerobic exercise without signs/symptoms of physical distress Progress to 45 minutes of aerobic exercise without signs/symptoms of physical distress Progress to 45 minutes of aerobic exercise without signs/symptoms of physical distress Progress to 45 minutes of aerobic exercise without signs/symptoms of physical distress   Intensity -  40-80% HRR -  40-80% HRR THRR unchanged THRR unchanged THRR unchanged     Progression   Progression  -  - Continue to progress workloads to maintain intensity without signs/symptoms of physical distress. Continue to progress workloads to maintain intensity without signs/symptoms of physical distress. Continue to progress workloads to maintain intensity without signs/symptoms of physical distress.     Resistance Training   Training Prescription Yes Yes Yes Yes Yes   Weight orange bands orange bands orange bands orange bands orange bands   Reps 10-15 10-15 10-15 10-15 10-15   Time -  10  minutes 10 Minutes 10 Minutes 10 Minutes 10 Minutes     Interval Training   Interval Training No No No No No     Oxygen   Oxygen Continuous Continuous Continuous Continuous Continuous   Liters '2 2 2 2 2     '$ NuStep   Level 2  - '2 2 2   '$ Minutes 17  - '17 17 17   '$ METs 1.6  - 1.9 1.5 1.7     Arm Ergometer   Level '2 2 3 2  '$ reduced workload d/t shoulder pinched nerve 2   Minutes '17 17 17 17 17     '$ Track   Laps '9 10 11 8  '$ -   Minutes '17 17 17 17  '$ -   Row Name 12/18/16 1500 12/25/16 1500 12/27/16 1500 01/01/17 1500       Response to Exercise   Blood Pressure (Admit) 104/60 1'00/70 98/60 96/62 '$    Blood Pressure (Exercise) 1'30/64 96/54 94/64 '$ 120/60    Blood Pressure (Exit) 98/60 106/60 98/60 102/56    Heart Rate (Admit) 59 bpm 57 bpm 56 bpm 61 bpm    Heart Rate (Exercise) 64 bpm 68 bpm 72 bpm 76 bpm    Heart Rate (Exit) 61 bpm 60 bpm 58 bpm 58 bpm    Oxygen Saturation (Admit) 98 % 99 % 100 % 99 %    Oxygen Saturation (Exercise) 99 % 100 % 100 % 99 %    Oxygen Saturation (Exit) 98 % 99 % 100 % 97 %    Rating of Perceived Exertion (Exercise) '11 11 13 13    '$ Perceived Dyspnea (Exercise) 1 1 0 1    Duration Progress to 45 minutes of aerobic exercise without signs/symptoms of physical distress  Progress to 45 minutes of aerobic exercise without signs/symptoms of physical distress Progress to 45 minutes of aerobic exercise without signs/symptoms of physical distress Progress to 45 minutes of aerobic exercise without signs/symptoms of physical distress    Intensity THRR unchanged THRR unchanged THRR unchanged THRR unchanged      Progression   Progression Continue to progress workloads to maintain intensity without signs/symptoms of physical distress. Continue to progress workloads to maintain intensity without signs/symptoms of physical distress. Continue to progress workloads to maintain intensity without signs/symptoms of physical distress. Continue to progress workloads to maintain intensity  without signs/symptoms of physical distress.      Resistance Training   Training Prescription Yes Yes Yes Yes    Weight orange bands orange bands orange bands orange bands    Reps 10-15 10-15 10-15 10-15    Time 10 Minutes 10 Minutes 10 Minutes 10 Minutes      Interval Training   Interval Training No No No No      Oxygen   Oxygen Continuous Continuous Continuous Continuous    Liters '2 2 2 2      '$ NuStep   Level 2 2  - 4    Minutes 17 17  - 17    METs 1.7 1.7  - 1.8      Arm Ergometer   Level '2 3 3  '$ -    Minutes '17 17 17  '$ -      Track   Laps '8 8 9 22    '$ Minutes '17 17 17 '$ 34       Exercise Comments:   Exercise Goals and Review:     Exercise Goals    Row Name 11/09/16 1418             Exercise Goals   Increase Physical Activity Yes       Intervention Provide advice, education, support and counseling about physical activity/exercise needs.;Develop an individualized exercise prescription for aerobic and resistive training based on initial evaluation findings, risk stratification, comorbidities and participant's personal goals.       Expected Outcomes Achievement of increased cardiorespiratory fitness and enhanced flexibility, muscular endurance and strength shown through measurements of functional capacity and personal statement of participant.       Increase Strength and Stamina Yes       Intervention Provide advice, education, support and counseling about physical activity/exercise needs.;Develop an individualized exercise prescription for aerobic and resistive training based on initial evaluation findings, risk stratification, comorbidities and participant's personal goals.       Expected Outcomes Achievement of increased cardiorespiratory fitness and enhanced flexibility, muscular endurance and strength shown through measurements of functional capacity and personal statement of participant.          Exercise Goals Re-Evaluation :     Exercise Goals Re-Evaluation     Row Name 12/03/16 1143 12/28/16 1046           Exercise Goal Re-Evaluation   Exercise Goals Review Increase Physical Activity;Increase Strenth and Stamina Increase Strenth and Stamina;Increase Physical Activity      Comments Patient has only attended three exercise sessions. Will cont. to monitor and progress as able.  Patient is making slow progress. She has difficulty due to orthopedic issues. We have taken her off the arm ergometer and placed her on the walking track for two stations or 30 minutes. This will help her in reaching her weight loss goal. Will cont. to monitor and progress patient as able.  Expected Outcomes Through exercising at [facility] and at home, patient will increase physical strength and stamina and find ADL's easier to perform.  Through exercising at [facility] and at home, patient will increase physical strength and stamina and find ADL's easier to perform.          Discharge Exercise Prescription (Final Exercise Prescription Changes):     Exercise Prescription Changes - 01/01/17 1500      Response to Exercise   Blood Pressure (Admit) 96/62   Blood Pressure (Exercise) 120/60   Blood Pressure (Exit) 102/56   Heart Rate (Admit) 61 bpm   Heart Rate (Exercise) 76 bpm   Heart Rate (Exit) 58 bpm   Oxygen Saturation (Admit) 99 %   Oxygen Saturation (Exercise) 99 %   Oxygen Saturation (Exit) 97 %   Rating of Perceived Exertion (Exercise) 13   Perceived Dyspnea (Exercise) 1   Duration Progress to 45 minutes of aerobic exercise without signs/symptoms of physical distress   Intensity THRR unchanged     Progression   Progression Continue to progress workloads to maintain intensity without signs/symptoms of physical distress.     Resistance Training   Training Prescription Yes   Weight orange bands   Reps 10-15   Time 10 Minutes     Interval Training   Interval Training No     Oxygen   Oxygen Continuous   Liters 2     NuStep   Level 4   Minutes 17   METs  1.8     Track   Laps 22   Minutes 34      Nutrition:  Target Goals: Understanding of nutrition guidelines, daily intake of sodium '1500mg'$ , cholesterol '200mg'$ , calories 30% from fat and 7% or less from saturated fats, daily to have 5 or more servings of fruits and vegetables.  Biometrics:     Pre Biometrics - 11/09/16 1418      Pre Biometrics   Grip Strength 18 kg       Nutrition Therapy Plan and Nutrition Goals:     Nutrition Therapy & Goals - 12/18/16 1516      Nutrition Therapy   Diet Carb Modified, Therapeutic Lifestyle Changes     Personal Nutrition Goals   Nutrition Goal Identify food quantities necessary to achieve wt loss of  -2# per week to a goal wt loss of 2.7-10.9 kg (6-24 lb) at graduation from [name].     Intervention Plan   Intervention Prescribe, educate and counsel regarding individualized specific dietary modifications aiming towards targeted core components such as weight, hypertension, lipid management, diabetes, heart failure and other comorbidities.   Expected Outcomes Short Term Goal: Understand basic principles of dietary content, such as calories, fat, sodium, cholesterol and nutrients.;Long Term Goal: Adherence to prescribed nutrition plan.      Nutrition Discharge: Rate Your Plate Scores:     Nutrition Assessments - 12/17/16 0946      Rate Your Plate Scores   Pre Score 58      Nutrition Goals Re-Evaluation:   Nutrition Goals Discharge (Final Nutrition Goals Re-Evaluation):   Psychosocial: Target Goals: Acknowledge presence or absence of significant depression and/or stress, maximize coping skills, provide positive support system. Participant is able to verbalize types and ability to use techniques and skills needed for reducing stress and depression.  Initial Review & Psychosocial Screening:     Initial Psych Review & Screening - 11/09/16 1436      Initial Review   Current issues with None Identified  Family  Dynamics   Good Support System? Yes     Barriers   Psychosocial barriers to participate in program There are no identifiable barriers or psychosocial needs.      Quality of Life Scores:   PHQ-9: Recent Review Flowsheet Data    Depression screen [facility] 2/9 11/09/2016   Decreased Interest 0   Down, Depressed, Hopeless 0   PHQ - 2 Score 0     Interpretation of Total Score  Total Score Depression Severity:  1-4 = Minimal depression, 5-9 = Mild depression, 10-14 = Moderate depression, 15-19 = Moderately severe depression, 20-27 = Severe depression   Psychosocial Evaluation and Intervention:     Psychosocial Evaluation - 11/09/16 1438      Psychosocial Evaluation & Interventions   Interventions Encouraged to exercise with the program and follow exercise prescription   Continue Psychosocial Services  No Follow up required      Psychosocial Re-Evaluation:     Psychosocial Re-Evaluation    [place] Name 11/09/16 1439 12/04/16 0936 01/01/17 1652         Psychosocial Re-Evaluation   Current issues with None Identified None Identified None Identified     Interventions Encouraged to attend [facility] for the exercise Encouraged to attend [facility] for the exercise Encouraged to attend [facility] for the exercise     Continue Psychosocial Services  No Follow up required No Follow up required No Follow up required        Psychosocial Discharge (Final Psychosocial Re-Evaluation):     Psychosocial Re-Evaluation - 01/01/17 1652      Psychosocial Re-Evaluation   Current issues with None Identified   Interventions Encouraged to attend [facility] for the exercise   Continue Psychosocial Services  No Follow up required      Education: Education Goals: Education classes will be provided on a weekly basis, covering required topics. Participant will state understanding/return demonstration of topics presented.  Learning  Barriers/Preferences:     Learning Barriers/Preferences - 11/09/16 1430      Learning Barriers/Preferences   Learning Barriers None   Learning Preferences Group Instruction;Individual Instruction;Written Material;Skilled Demonstration      Education Topics: Risk Factor Reduction:  -Group instruction that is supported by a PowerPoint presentation. Instructor discusses the definition of a risk factor, different risk factors for Tiffany disease, and how the heart and lungs work together.     [name] OTHER RESPIRATORY from 12/27/2016 in [facility]  Date  12/27/16  Educator  EP  Instruction Review Code  2- meets goals/outcomes      Nutrition for [facility]iffany Patient:  -Group instruction provided by PowerPoint slides, verbal discussion, and written materials to support subject matter. The instructor gives an explanation and review of healthy diet recommendations, which includes a discussion on weight management, recommendations for fruit and vegetable consumption, as well as protein, fluid, caffeine, fiber, sodium, sugar, and alcohol. Tips for eating when patients are short of breath are discussed.   [name] OTHER RESPIRATORY from 12/27/2016 in [place]  Date  11/22/16  Educator  RD  Instruction Review Code  2- meets goals/outcomes      Pursed Lip Breathing:  -Group instruction that is supported by demonstration and informational handouts. Instructor discusses the benefits of pursed lip and diaphragmatic breathing and detailed demonstration on how to preform both.     Oxygen Safety:  -Group instruction provided by PowerPoint, verbal discussion, and written material to support subject  matter. There is an overview of "What is Oxygen" and "Why do we need it".  Instructor also reviews how to create a safe environment for oxygen use, the importance of using oxygen as prescribed, and the risks of noncompliance.  There is a brief discussion on traveling with oxygen and resources the patient may utilize.   Oxygen Equipment:  -Group instruction provided by [facility] Staff utilizing handouts, written materials, and equipment demonstrations.   Signs and Symptoms:  -Group instruction provided by written material and verbal discussion to support subject matter. Warning signs and symptoms of infection, stroke, and heart attack are reviewed and when to call the physician/911 reinforced. Tips for preventing the spread of infection discussed.   [name] OTHER RESPIRATORY from 12/27/2016 in [place]  Date  12/13/16  Educator  rn  Instruction Review Code  2- meets goals/outcomes      Advanced Directives:  -Group instruction provided by verbal instruction and written material to support subject matter. Instructor reviews Advanced Directive laws and proper instruction for filling out document.   Tiffany Video:  -Group video education that reviews the importance of medication and oxygen compliance, exercise, good nutrition, Tiffany hygiene, and pursed lip and diaphragmatic breathing for the Tiffany patient.   Exercise for the Tiffany Patient:  -Group instruction that is supported by a PowerPoint presentation. Instructor discusses benefits of exercise, core components of exercise, frequency, duration, and intensity of an exercise routine, importance of utilizing pulse oximetry during exercise, safety while exercising, and options of places to exercise outside of [place].     [name] OTHER RESPIRATORY from 12/27/2016 in [place]  Date  11/29/16  Educator  ep  Instruction Review Code  2- meets goals/outcomes      Tiffany Medications:  -Verbally interactive group education provided by instructor with focus on inhaled medications and proper administration.   Anatomy and Physiology of the Respiratory System and  Intimacy:  -Group instruction provided by PowerPoint, verbal discussion, and written material to support subject matter. Instructor reviews respiratory cycle and anatomical components of the respiratory system and their functions. Instructor also reviews differences in obstructive and restrictive respiratory diseases with examples of each. Intimacy, Sex, and Sexuality differences are reviewed with a discussion on how relationships can change when diagnosed with Tiffany disease. Common sexual concerns are reviewed.   [name] OTHER RESPIRATORY from 12/27/2016 in [place]  Date  12/20/16  Educator  Rn  Instruction Review Code  2- meets goals/outcomes      MD DAY -A group question and answer session with a medical doctor that allows participants to ask questions that relate to their Tiffany disease state.   OTHER EDUCATION -Group or individual verbal, written, or video instructions that support the educational goals of the [name] program.   Knowledge Questionnaire Score:     Knowledge Questionnaire Score - 12/04/16 1548      Knowledge Questionnaire Score   Pre Score 11/13      Core Components/Risk Factors/Patient Goals at Admission:     Personal Goals and Risk Factors at Admission - 11/09/16 1435      Core Components/Risk Factors/Patient Goals on Admission    Weight Management Yes;Obesity   Intervention Obesity: Provide education and appropriate resources to help participant work on and attain dietary goals.;Weight Management/Obesity: Establish reasonable short term and long term weight goals.;Weight Management: Provide education and appropriate resources to help participant work on and attain  dietary goals.;Weight Management: Develop a combined nutrition and exercise program designed to reach desired caloric intake, while maintaining appropriate intake of nutrient and fiber, sodium and fats, and appropriate energy expenditure  required for the weight goal.   Admit Weight 214 lb 11.5 oz (97.4 kg)   Expected Outcomes Short Term: Continue to assess and modify interventions until short term weight is achieved;Weight Loss: Understanding of general recommendations for a balanced deficit meal plan, which promotes 1-2 lb weight loss per week and includes a negative energy balance of [phone] kcal/d;Understanding recommendations for meals to include 15-35% energy as protein, 25-35% energy from fat, 35-60% energy from carbohydrates, less than '200mg'$  of dietary cholesterol, 20-35 gm of total fiber daily;Understanding of distribution of calorie intake throughout the day with the consumption of 4-5 meals/snacks   Improve shortness of breath with ADL's Yes   Intervention Provide education, individualized exercise plan and daily activity instruction to help decrease symptoms of SOB with activities of daily living.   Expected Outcomes Short Term: Achieves a reduction of symptoms when performing activities of daily living.   Develop more efficient breathing techniques such as purse lipped breathing and diaphragmatic breathing; and practicing self-pacing with activity Yes   Intervention Provide education, demonstration and support about specific breathing techniuqes utilized for more efficient breathing. Include techniques such as pursed lipped breathing, diaphragmatic breathing and self-pacing activity.   Expected Outcomes Short Term: Participant will be able to demonstrate and use breathing techniques as needed throughout daily activities.      Core Components/Risk Factors/Patient Goals Review:      Goals and Risk Factor Review    Row Name 12/04/16 0934 12/06/16 0900 01/01/17 1648         Core Components/Risk Factors/Patient Goals Review   Personal Goals Review Weight Management/Obesity;Improve shortness of breath with ADL's;Develop more efficient breathing techniques such as purse lipped breathing and diaphragmatic breathing and  practicing self-pacing with activity. Weight Management/Obesity;Improve shortness of breath with ADL's;Develop more efficient breathing techniques such as purse lipped breathing and diaphragmatic breathing and practicing self-pacing with activity. Weight Management/Obesity;Improve shortness of breath with ADL's;Develop more efficient breathing techniques such as purse lipped breathing and diaphragmatic breathing and practicing self-pacing with activity.     Review No weight loss, has exercised in 3 exercise sessions, too early to see progress  - patient continues to make slow progress towards [name] goals. she has not began to loose weight however an adjustment in her exercise prescription will hopefully address this issue. she is utilizing pursed lip breathing when prompted and continues to self pace. 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     Expected Outcomes expect progression towards goals in the next 30 days  - expect progression towards goals in the next 30 days        Core Components/Risk Factors/Patient Goals at Discharge (Final Review):      Goals and Risk Factor Review - 01/01/17 1648      Core Components/Risk Factors/Patient Goals Review   Personal Goals Review Weight Management/Obesity;Improve shortness of breath with ADL's;Develop more efficient breathing techniques such as purse lipped breathing and diaphragmatic breathing and practicing self-pacing with activity.   Review patient continues to make slow progress towards [name] goals. she has not began to loose weight however an adjustment in her exercise prescription will hopefully address this issue. she is utilizing pursed lip breathing when prompted  and continues to self pace. 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   Expected  Outcomes expect progression towards goals in the next 30 days      ITP Comments:   Comments: ITP REVIEW Pt is making slow progress toward [name] goals after completing 9 sessions. Recommend continued exercise, life style modification, education, and utilization of breathing techniques to increase stamina and strength and decrease shortness of breath with exertion.

## 2017-01-01 NOTE — Progress Notes (Signed)
Daily Session Note  Patient Details  Name: [name] MRN: [id number] Date of Birth: 11/01/1935 Referring Provider:     Pulmonary Rehab Walk Test from 11/13/2016 in [facility]  Referring Provider  Dr. Bensimhon      Encounter Date: 01/01/2017  Check In:     Session Check In - 01/01/17 1520      Check-In   Location [facility]   Staff Present Molly diVincenzo, MS, ACSM RCEP, Exercise Physiologist;Lisa Hughes, RN;Olinty Richards, MS, ACSM CEP, Exercise Physiologist   Supervising physician immediately available to respond to emergencies [facility] immediately available   Physician(s) Dr. Krishnan   Medication changes reported     No   Fall or balance concerns reported    No   Warm-up and Cool-down Performed as group-led instruction   Resistance Training Performed No   VAD Patient? No     Pain Assessment   Currently in Pain? No/denies   Multiple Pain Sites No      Capillary Blood Glucose: No results found for this or any previous visit (from the past 24 hour(s)).      Exercise Prescription Changes - 01/01/17 1500      Response to Exercise   Blood Pressure (Admit) 96/62   Blood Pressure (Exercise) 120/60   Blood Pressure (Exit) 102/56   Heart Rate (Admit) 61 bpm   Heart Rate (Exercise) 76 bpm   Heart Rate (Exit) 58 bpm   Oxygen Saturation (Admit) 99 %   Oxygen Saturation (Exercise) 99 %   Oxygen Saturation (Exit) 97 %   Rating of Perceived Exertion (Exercise) 13   Perceived Dyspnea (Exercise) 1   Duration Progress to 45 minutes of aerobic exercise without signs/symptoms of physical distress   Intensity THRR unchanged     Progression   Progression Continue to progress workloads to maintain intensity without signs/symptoms of physical distress.     Resistance Training   Training Prescription Yes   Weight orange bands   Reps 10-15   Time 10 Minutes     Interval Training   Interval Training No     Oxygen   Oxygen Continuous   Liters 2     NuStep   Level 4   Minutes 17   METs 1.8     Track   Laps 22   Minutes 34      History  Smoking Status  . Never Smoker  Smokeless Tobacco  . Never Used    Goals Met:  Exercise tolerated well No report of cardiac concerns or symptoms Strength training completed today  Goals Unmet:  Not Applicable  Comments: Service time is from 1330 to 1450    Dr. Wesam G. Yacoub is Medical Director for Pulmonary Rehab at [facility]. 

## 2017-01-03 ENCOUNTER — Other Ambulatory Visit (HOSPITAL_COMMUNITY): Payer: Self-pay | Admitting: Internal Medicine

## 2017-01-03 ENCOUNTER — Encounter (HOSPITAL_COMMUNITY)
Admission: RE | Admit: 2017-01-03 | Discharge: 2017-01-03 | Disposition: A | Discharge status: Home / Self Care | Payer: 59 | Source: Ambulatory Visit | Attending: Internal Medicine | Admitting: Internal Medicine

## 2017-01-03 VITALS — Wt 213.4 lb

## 2017-01-03 DIAGNOSIS — I272 Pulmonary hypertension, unspecified: Secondary | ICD-10-CM | POA: Insufficient documentation

## 2017-01-03 NOTE — Progress Notes (Signed)
Daily Session Note  Patient Details  Name: [name] MRN: [id number] Date of Birth: 1936-01-18 Referring Provider:     Pulmonary Rehab Walk Test from 11/13/2016 in [place]  Referring Provider  Dr. Haroldine Laws      Encounter Date: 01/03/2017  Check In:     Session Check In - 01/03/17 1342      Check-In   [facility]   Staff Present Su Hilt, MS, ACSM RCEP, Exercise Physiologist;Ellarie Picking Ysidro Evert, RN;Portia Rollene Rotunda, RN, BSN   Supervising physician immediately available to respond to emergencies [facility] immediately available   Physician(s) Dr. Jonnie Finner   Medication changes reported     No   Fall or balance concerns reported    No   Tobacco Cessation No Change   Warm-up and Cool-down Performed as group-led instruction   Resistance Training Performed Yes   VAD Patient? No     Pain Assessment   Currently in Pain? No/denies   Multiple Pain Sites No      Capillary Blood Glucose: No results found for this or any previous visit (from the past 24 hour(s)).      Exercise Prescription Changes - 01/03/17 1500      Response to Exercise   Blood Pressure (Admit) 100/60   Blood Pressure (Exercise) 96/64   Blood Pressure (Exit) 102/70   Heart Rate (Admit) 62 bpm   Heart Rate (Exercise) 85 bpm   Heart Rate (Exit) 63 bpm   Oxygen Saturation (Admit) 100 %   Oxygen Saturation (Exercise) 97 %   Oxygen Saturation (Exit) 98 %   Rating of Perceived Exertion (Exercise) 13   Perceived Dyspnea (Exercise) 1   Duration Progress to 45 minutes of aerobic exercise without signs/symptoms of physical distress   Intensity THRR unchanged     Progression   Progression Continue to progress workloads to maintain intensity without signs/symptoms of physical distress.     Resistance Training   Training Prescription Yes   Weight orange bands   Reps 10-15   Time 10 Minutes     Interval Training   Interval Training No     Track   Laps 14   Minutes 34      History  Smoking Status  . Never Smoker  Smokeless Tobacco  . Never Used    Goals Met:  Exercise tolerated well No report of cardiac concerns or symptoms Strength training completed today  Goals Unmet:  Not Applicable  Comments: Service time is from 1330 to 1515    Dr. Rush Farmer is Medical Director for Pulmonary Rehab at [facility].

## 2017-01-04 ENCOUNTER — Ambulatory Visit
Admission: RE | Admit: 2017-01-04 | Discharge: 2017-01-04 | Disposition: A | Discharge status: Home / Self Care | Payer: 59 | Source: Ambulatory Visit | Attending: Family Medicine | Admitting: Family Medicine

## 2017-01-04 DIAGNOSIS — Q839 Congenital malformation of breast, unspecified: Secondary | ICD-10-CM

## 2017-01-04 DIAGNOSIS — N6489 Other specified disorders of breast: Secondary | ICD-10-CM | POA: Diagnosis not present

## 2017-01-04 DIAGNOSIS — R928 Other abnormal and inconclusive findings on diagnostic imaging of breast: Secondary | ICD-10-CM | POA: Diagnosis not present

## 2017-01-08 ENCOUNTER — Encounter (HOSPITAL_COMMUNITY)
Admission: RE | Admit: 2017-01-08 | Discharge: 2017-01-08 | Disposition: A | Discharge status: Home / Self Care | Payer: 59 | Source: Ambulatory Visit | Attending: Internal Medicine | Admitting: Internal Medicine

## 2017-01-08 VITALS — Wt 212.1 lb

## 2017-01-08 DIAGNOSIS — I272 Pulmonary hypertension, unspecified: Secondary | ICD-10-CM | POA: Diagnosis not present

## 2017-01-08 NOTE — Progress Notes (Signed)
I have reviewed a Home Exercise Prescription with [name] . [name] is not currently exercising at home.  The patient was advised to walk 2-3 days a week for 30 minutes.  [name] and I discussed how to progress their exercise prescription.  The patient stated that their goals were to lose weight, have a better attitude, and cont to improve confidence.  The patient stated that they understand the exercise prescription.  We reviewed exercise guidelines, target heart rate during exercise, oxygen use, weather, home pulse oximeter, endpoints for exercise, and goals.  Patient is encouraged to come to me with any questions. I will continue to follow up with the patient to assist them with progression and safety.

## 2017-01-08 NOTE — Progress Notes (Signed)
Daily Session Note  Patient Details  Name: [name] MRN: [id number] Date of Birth: June 11, 1935 Referring Provider:     Pulmonary Rehab Walk Test from 11/13/2016 in [place]  Referring Provider  Dr. Haroldine Laws      Encounter Date: 01/08/2017  Check In:   Capillary Blood Glucose: No results found for this or any previous visit (from the past 24 hour(s)).      Exercise Prescription Changes - 01/08/17 1500      Response to Exercise   Blood Pressure (Admit) 110/56   Blood Pressure (Exercise) 130/62   Blood Pressure (Exit) 110/82   Heart Rate (Admit) 64 bpm   Heart Rate (Exercise) 71 bpm   Heart Rate (Exit) 59 bpm   Oxygen Saturation (Admit) 99 %   Oxygen Saturation (Exercise) 99 %   Oxygen Saturation (Exit) 98 %   Rating of Perceived Exertion (Exercise) 13   Perceived Dyspnea (Exercise) 1   Duration Progress to 45 minutes of aerobic exercise without signs/symptoms of physical distress   Intensity THRR unchanged     Progression   Progression Continue to progress workloads to maintain intensity without signs/symptoms of physical distress.     Resistance Training   Training Prescription Yes   Weight orange bands   Reps 10-15   Time 10 Minutes     Interval Training   Interval Training No     NuStep   Level 4   Minutes 17   METs 1.4     Track   Laps 18   Minutes 34      History  Smoking Status  . Never Smoker  Smokeless Tobacco  . Never Used    Goals Met:  Exercise tolerated well No report of cardiac concerns or symptoms Strength training completed today  Goals Unmet:  Not Applicable  Comments: Service time is from 1:30p to 3:10p    Dr. Rush Farmer is Medical Director for Pulmonary Rehab at [facility].

## 2017-01-10 ENCOUNTER — Encounter (HOSPITAL_COMMUNITY)
Admission: RE | Admit: 2017-01-10 | Discharge: 2017-01-10 | Disposition: A | Discharge status: Home / Self Care | Payer: 59 | Source: Ambulatory Visit | Attending: Internal Medicine | Admitting: Internal Medicine

## 2017-01-10 VITALS — Wt 212.5 lb

## 2017-01-10 DIAGNOSIS — I272 Pulmonary hypertension, unspecified: Secondary | ICD-10-CM | POA: Diagnosis not present

## 2017-01-10 NOTE — Progress Notes (Signed)
Daily Session Note  Patient Details  Name: LYNNELLE MESMER MRN: [id number] Date of Birth: [birth year] Referring Provider:     Pulmonary Rehab Walk Test from 11/13/2016 in [place]  Referring Provider  Dr. Haroldine Laws      Encounter Date: 01/10/2017  Check In:     Session Check In - 01/10/17 1343      Check-In   [facility]   Staff Present Su Hilt, MS, ACSM RCEP, Exercise Physiologist;Madden Piazza Ysidro Evert, RN;Portia Rollene Rotunda, RN, BSN   Supervising physician immediately available to respond to emergencies [facility] immediately available   Physician(s) Dr. Clementeen Graham   Medication changes reported     No   Fall or balance concerns reported    No   Tobacco Cessation No Change   Warm-up and Cool-down Performed as group-led instruction   Resistance Training Performed Yes   VAD Patient? No     Pain Assessment   Currently in Pain? No/denies   Multiple Pain Sites No      Capillary Blood Glucose: No results found for this or any previous visit (from the past 24 hour(s)).      Exercise Prescription Changes - 01/10/17 1500      Response to Exercise   Blood Pressure (Admit) 104/64   Blood Pressure (Exercise) 130/60   Blood Pressure (Exit) 104/48   Heart Rate (Admit) 65 bpm   Heart Rate (Exercise) 77 bpm   Heart Rate (Exit) 66 bpm   Oxygen Saturation (Admit) 100 %   Oxygen Saturation (Exercise) 97 %   Oxygen Saturation (Exit) 100 %   Rating of Perceived Exertion (Exercise) 11   Perceived Dyspnea (Exercise) 0   Duration Progress to 45 minutes of aerobic exercise without signs/symptoms of physical distress   Intensity THRR unchanged     Progression   Progression Continue to progress workloads to maintain intensity without signs/symptoms of physical distress.     Resistance Training   Training Prescription Yes   Weight orange bands   Reps 10-15   Time 10 Minutes     Interval Training   Interval Training No     NuStep   Level 5   Minutes 17   METs 2     Track   Laps 8   Minutes 17      History  Smoking Status  . Never Smoker  Smokeless Tobacco  . Never Used    Goals Met:  Exercise tolerated well No report of cardiac concerns or symptoms Strength training completed today  Goals Unmet:  Not Applicable  Comments: Service time is from 1330 to 1530    Dr. Rush Farmer is Medical Director for Pulmonary Rehab at [facility].

## 2017-01-11 ENCOUNTER — Ambulatory Visit: Payer: 59

## 2017-01-11 ENCOUNTER — Telehealth: Payer: Self-pay | Admitting: Cardiology

## 2017-01-11 ENCOUNTER — Telehealth (HOSPITAL_COMMUNITY): Payer: Self-pay

## 2017-01-11 NOTE — Telephone Encounter (Signed)
Anderson Malta ( Nurse Case Manager [facility] [address]) is calling to see Mrs. Deyoung has diagnosis of Heart Failure . Please Call

## 2017-01-11 NOTE — Telephone Encounter (Signed)
Case manager with [facility] called to confirm patient's HF diagnosis and whether she should weigh daily. Confirmed diastolic HF diagnosis, EF per last echo, daily weights, salt and fluid restrictions, and upcoming apt 9/20. CM RN states she will set up scale with tablet for telemonitoring and keep patient educated on above items and alarm our office of concerns/issues.  Renee Pain, RN

## 2017-01-15 ENCOUNTER — Encounter (HOSPITAL_COMMUNITY)
Admission: RE | Admit: 2017-01-15 | Discharge: 2017-01-15 | Disposition: A | Discharge status: Home / Self Care | Payer: 59 | Source: Ambulatory Visit | Attending: Internal Medicine | Admitting: Internal Medicine

## 2017-01-15 VITALS — Wt 215.6 lb

## 2017-01-15 DIAGNOSIS — I272 Pulmonary hypertension, unspecified: Secondary | ICD-10-CM

## 2017-01-15 NOTE — Progress Notes (Signed)
Daily Session Note  Patient Details  Name: [name] MRN: [id number] Date of Birth: 04/24/1936 Referring Provider:     Pulmonary Rehab Walk Test from 11/13/2016 in [facility]  Referring Provider  Dr. Bensimhon      Encounter Date: 01/15/2017  Check In:     Session Check In - 01/15/17 1538      Check-In   [facility]   Staff Present Molly diVincenzo, MS, ACSM RCEP, Exercise Physiologist;Lisa Hughes, RN;Olinty Richards, MS, ACSM CEP, Exercise Physiologist;Annedrea Stackhouse, RN, MHA   Supervising physician immediately available to respond to emergencies [facility] immediately available   Physician(s) Dr. Dhungel   Medication changes reported     No   Fall or balance concerns reported    No   Tobacco Cessation No Change   Warm-up and Cool-down Performed as group-led instruction   Resistance Training Performed Yes     Pain Assessment   Currently in Pain? No/denies      Capillary Blood Glucose: No results found for this or any previous visit (from the past 24 hour(s)).      Exercise Prescription Changes - 01/15/17 1500      Response to Exercise   Blood Pressure (Admit) 96/56   Blood Pressure (Exercise) 112/68   Blood Pressure (Exit) 122/62   Heart Rate (Admit) 66 bpm   Heart Rate (Exercise) 83 bpm   Heart Rate (Exit) 69 bpm   Oxygen Saturation (Admit) 100 %   Oxygen Saturation (Exercise) 98 %   Oxygen Saturation (Exit) 99 %   Rating of Perceived Exertion (Exercise) 11   Perceived Dyspnea (Exercise) 1   Duration Progress to 45 minutes of aerobic exercise without signs/symptoms of physical distress   Intensity THRR unchanged     Progression   Progression Continue to progress workloads to maintain intensity without signs/symptoms of physical distress.     Resistance Training   Training Prescription Yes   Weight orange bands   Reps 10-15   Time 10 Minutes     Interval Training   Interval  Training No     NuStep   Level 5   Minutes 17   METs 2     Track   Laps 11   Minutes 34      History  Smoking Status  . Never Smoker  Smokeless Tobacco  . Never Used    Goals Met:  Exercise tolerated well No report of cardiac concerns or symptoms Strength training completed today  Goals Unmet:  Not Applicable  Comments: Service time is from 1330 to 1500   Dr. Wesam G. Yacoub is Medical Director for Pulmonary Rehab at [facility]. 

## 2017-01-17 ENCOUNTER — Encounter (HOSPITAL_COMMUNITY)
Admission: RE | Admit: 2017-01-17 | Discharge: 2017-01-17 | Disposition: A | Discharge status: Home / Self Care | Payer: 59 | Source: Ambulatory Visit | Attending: Internal Medicine | Admitting: Internal Medicine

## 2017-01-17 VITALS — Wt 214.9 lb

## 2017-01-17 DIAGNOSIS — I272 Pulmonary hypertension, unspecified: Secondary | ICD-10-CM | POA: Diagnosis not present

## 2017-01-17 NOTE — Progress Notes (Signed)
Daily Session Note  Patient Details  Name: [name] MRN: [id number] Date of Birth: 05/03/1936 Referring Provider:     Pulmonary Rehab Walk Test from 11/13/2016 in [facility]  Referring Provider  Dr. Bensimhon      Encounter Date: 01/17/2017  Check In:     Session Check In - 01/17/17 1330      Check-In   Location [facility]   Staff Present Portia Payne, RN, BSN;Molly diVincenzo, MS, ACSM RCEP, Exercise Physiologist;Lisa Hughes, RN   Supervising physician immediately available to respond to emergencies [facility] immediately available   Physician(s) Dr. Vega   Medication changes reported     No   Fall or balance concerns reported    No   Tobacco Cessation No Change   Warm-up and Cool-down Performed as group-led instruction   Resistance Training Performed Yes   VAD Patient? No     Pain Assessment   Currently in Pain? No/denies   Multiple Pain Sites No      Capillary Blood Glucose: No results found for this or any previous visit (from the past 24 hour(s)).      Exercise Prescription Changes - 01/17/17 1547      Response to Exercise   Blood Pressure (Admit) 120/70   Blood Pressure (Exercise) 110/60   Blood Pressure (Exit) 100/66   Heart Rate (Admit) 66 bpm   Heart Rate (Exercise) 81 bpm   Heart Rate (Exit) 73 bpm   Oxygen Saturation (Admit) 99 %   Oxygen Saturation (Exercise) 96 %   Oxygen Saturation (Exit) 99 %   Rating of Perceived Exertion (Exercise) 12   Perceived Dyspnea (Exercise) 1   Duration Progress to 45 minutes of aerobic exercise without signs/symptoms of physical distress   Intensity THRR unchanged     Progression   Progression Continue to progress workloads to maintain intensity without signs/symptoms of physical distress.     Resistance Training   Training Prescription Yes   Weight orange bands   Reps 10-15   Time 10 Minutes     Interval Training   Interval Training No     NuStep   Level 5   Minutes 17   METs 1.8     Track   Laps 19   Minutes 34      History  Smoking Status  . Never Smoker  Smokeless Tobacco  . Never Used    Goals Met:  Independence with exercise equipment Improved SOB with ADL's Using PLB without cueing & demonstrates good technique Exercise tolerated well No report of cardiac concerns or symptoms Strength training completed today  Goals Unmet:  Not Applicable  Comments: Service time is from 1330 to 1500   Dr. Wesam G. Yacoub is Medical Director for Pulmonary Rehab at [facility]. 

## 2017-01-22 ENCOUNTER — Encounter (HOSPITAL_COMMUNITY)
Admission: RE | Admit: 2017-01-22 | Discharge: 2017-01-22 | Disposition: A | Discharge status: Home / Self Care | Payer: 59 | Source: Ambulatory Visit | Attending: Internal Medicine | Admitting: Internal Medicine

## 2017-01-22 DIAGNOSIS — I272 Pulmonary hypertension, unspecified: Secondary | ICD-10-CM | POA: Diagnosis not present

## 2017-01-24 ENCOUNTER — Encounter (HOSPITAL_COMMUNITY)
Admission: RE | Admit: 2017-01-24 | Discharge: 2017-01-24 | Disposition: A | Discharge status: Home / Self Care | Payer: 59 | Source: Ambulatory Visit | Attending: Internal Medicine | Admitting: Internal Medicine

## 2017-01-24 VITALS — Wt 214.7 lb

## 2017-01-24 DIAGNOSIS — I272 Pulmonary hypertension, unspecified: Secondary | ICD-10-CM | POA: Diagnosis not present

## 2017-01-24 NOTE — Progress Notes (Signed)
Daily Session Note  Patient Details  Name: [name] MRN: [id number] Date of Birth: [birth year] Referring Provider:     Pulmonary Rehab Walk Test from 11/13/2016 in [place]  Referring Provider  Dr. Haroldine Laws      Encounter Date: 01/24/2017  Check In:     Session Check In - 01/24/17 1339      Check-In   Location [facility]   Staff Present Su Hilt, MS, ACSM RCEP, Exercise Physiologist;Amilliana Hayworth Ysidro Evert, RN;Portia Rollene Rotunda, RN, BSN   Supervising physician immediately available to respond to emergencies [facility] immediately available   Physician(s) Dr. Clementeen Graham   Medication changes reported     No   Fall or balance concerns reported    No   Tobacco Cessation No Change   Warm-up and Cool-down Performed as group-led instruction   Resistance Training Performed Yes   VAD Patient? No     Pain Assessment   Currently in Pain? No/denies   Multiple Pain Sites No      Capillary Blood Glucose: No results found for this or any previous visit (from the past 24 hour(s)).      Exercise Prescription Changes - 01/24/17 1600      Response to Exercise   Blood Pressure (Admit) 130/72   Blood Pressure (Exercise) 104/62   Blood Pressure (Exit) 98/62   Heart Rate (Admit) 64 bpm   Heart Rate (Exercise) 78 bpm   Heart Rate (Exit) 59 bpm   Oxygen Saturation (Admit) 100 %   Oxygen Saturation (Exercise) 97 %   Oxygen Saturation (Exit) 98 %   Rating of Perceived Exertion (Exercise) 12   Perceived Dyspnea (Exercise) 1   Duration Progress to 45 minutes of aerobic exercise without signs/symptoms of physical distress   Intensity THRR unchanged     Progression   Progression Continue to progress workloads to maintain intensity without signs/symptoms of physical distress.     Resistance Training   Training Prescription Yes   Weight orange bands   Reps 10-15   Time 10 Minutes     Interval Training   Interval Training No     NuStep   Level 6   Minutes 17   METs 2.3     Track   Minutes 17      History  Smoking Status  . Never Smoker  Smokeless Tobacco  . Never Used    Goals Met:  Exercise tolerated well No report of cardiac concerns or symptoms Strength training completed today  Goals Unmet:  Not Applicable  Comments: Service time is from 1330 to 1535    Dr. Rush Farmer is Medical Director for Pulmonary Rehab at [facility].

## 2017-01-25 DIAGNOSIS — L0100 Impetigo, unspecified: Secondary | ICD-10-CM | POA: Diagnosis not present

## 2017-01-29 ENCOUNTER — Encounter (HOSPITAL_COMMUNITY)
Admission: RE | Admit: 2017-01-29 | Discharge: 2017-01-29 | Disposition: A | Discharge status: Home / Self Care | Payer: 59 | Source: Ambulatory Visit | Attending: Internal Medicine | Admitting: Internal Medicine

## 2017-01-29 ENCOUNTER — Telehealth (HOSPITAL_COMMUNITY): Payer: Self-pay | Admitting: *Deleted

## 2017-01-30 ENCOUNTER — Encounter (HOSPITAL_COMMUNITY): Payer: Self-pay

## 2017-01-30 DIAGNOSIS — I272 Pulmonary hypertension, unspecified: Secondary | ICD-10-CM

## 2017-01-30 NOTE — Progress Notes (Signed)
Pulmonary Individual Treatment Plan  Patient Details  Name: [name] MRN: [id number] Date of Birth: Oct 19, 1935 Referring Provider:     Pulmonary Rehab Walk Test from 11/13/2016 in [name]  Referring Provider  Dr. Haroldine Laws      Initial Encounter Date:    Pulmonary Rehab Walk Test from 11/13/2016 in [place]  Date  11/13/16  Referring Provider  Dr. Haroldine Laws      Visit Diagnosis: Pulmonary hypertension ([place])  Patient's Home Medications on Admission:   Current Outpatient Prescriptions:  .  acetaminophen (TYLENOL) 500 MG tablet, Take 500 mg by mouth every 8 (eight) hours as needed for mild pain, moderate pain, fever or headache. , Disp: , Rfl:  .  amLODipine (NORVASC) 10 MG tablet, Take 10 mg by mouth daily., Disp: , Rfl: 6 .  aspirin EC 81 MG tablet, Take 81 mg by mouth daily., Disp: , Rfl:  .  atorvastatin (LIPITOR) 40 MG tablet, Take 40 mg by mouth at bedtime., Disp: , Rfl:  .  Cholecalciferol (VITAMIN D) 2000 UNITS CAPS, Take 2,000 Units by mouth daily., Disp: , Rfl:  .  docusate sodium (COLACE) 100 MG capsule, Take 100 mg by mouth daily as needed for mild constipation., Disp: , Rfl:  .  esomeprazole (NEXIUM) 40 MG capsule, Take 40 mg by mouth daily. , Disp: , Rfl:  .  ezetimibe (ZETIA) 10 MG tablet, Take 10 mg by mouth at bedtime., Disp: , Rfl:  .  furosemide (LASIX) 40 MG tablet, Take 40 mg by mouth daily. , Disp: , Rfl:  .  Multiple Vitamins-Minerals (MULTIVITAMINS THER. W/MINERALS) TABS, Take 1 tablet by mouth daily., Disp: , Rfl:  .  pioglitazone (ACTOS) 30 MG tablet, Take 30 mg by mouth daily., Disp: , Rfl:  .  spironolactone (ALDACTONE) 25 MG tablet, Take 0.5 tablets (12.5 mg total) by mouth daily., Disp: 15 tablet, Rfl: 3 .  valsartan (DIOVAN) 320 MG tablet, Take 320 mg by mouth daily., Disp: , Rfl:   Past Medical History: Past Medical History:  Diagnosis Date  . Anemia    takes iron  .  Arthritis    Dr Ronnie Derby  . Carotid artery stenosis    1-39% bilateral dopplers 08/2015  . Cataracts, bilateral   . Chronic kidney disease    stage III Dr Mercy Moore  . Diabetes mellitus   . GERD (gastroesophageal reflux disease)   . Heart murmur    Dr Irish Lack  . HTN (hypertension)   . Hyperlipidemia   . Morbid obesity with BMI of 40.0-44.9, adult ([place])   . Obstructive sleep apnea (adult) (pediatric)    w CPAP Severe OSA AHI 52/hr CPAP at 10cm H2O  . Pulmonary HTN ([place])    mild with PASP 49mHg echo 02/2014  . Right carotid bruit 08/19/2014  . Shortness of breath    resolved with CPAP  . Urination frequency   . Vitamin D deficiency disease   . Wears dentures    upper  . Wears glasses     Tobacco Use: History  Smoking Status  . Never Smoker  Smokeless Tobacco  . Never Used    Labs: Recent Review Flowsheet Data    Labs for ITP Cardiac and Pulmonary Rehab Latest Ref Rng & Units 05/02/2010 10/04/2015   Hemoglobin A1c 4.8 - 5.6 % 6.4 (NOTE)  According to the ADA Clinical Practice Recommendations for 2011, when HbA1c is used as a screening test:   >=6.5%   Diagnostic of Diabetes Mellitus           (if abnormal result is confirmed)  5.7-6.4%   Increased risk of developing Diabetes Mellitus  References:Diagnosis and Classification of Diabetes Mellitus,Diabetes JASN,0539,76(BHALP 1):S62-S69 and Standards of Medical Care in         Diabetes - 2011,Diabetes FXTK,2409,73 (Suppl 1):S11-S61.(H) 6.4(H)      Capillary Blood Glucose: Lab Results  Component Value Date   GLUCAP 106 (H) 11/22/2016   GLUCAP 115 (H) 11/22/2016   GLUCAP 117 (H) 11/20/2016   GLUCAP 115 (H) 11/20/2016   GLUCAP 150 (H) 10/05/2015       POCT Glucose    Row Name 12/11/16 1518 12/13/16 1542 12/25/16 1554         POCT Blood Glucose   Pre-Exercise 103 mg/dL  given lemonade pre exercise 121 mg/dL 98 mg/dL  ate a banana     Post-Exercise  117 mg/dL 119 mg/dL 104 mg/dL        Pulmonary Assessment Scores:     Pulmonary Assessment Scores    Row Name 12/04/16 1549         ADL UCSD   ADL Phase Entry     SOB Score total 8       CAT Score   CAT Score 4  Entry        Pulmonary Function Assessment:   Exercise Target Goals:    Exercise Program Goal: Individual exercise prescription set with THRR, safety & activity barriers. Participant demonstrates ability to understand and report RPE using BORG scale, to self-measure pulse accurately, and to acknowledge the importance of the exercise prescription.  Exercise Prescription Goal: Starting with aerobic activity 30 plus minutes a day, 3 days per week for initial exercise prescription. Provide home exercise prescription and guidelines that participant acknowledges understanding prior to discharge.  Activity Barriers & Risk Stratification:   6 Minute Walk:   Oxygen Initial Assessment:     Oxygen Initial Assessment - 12/03/16 1142      Initial 6 min Walk   Oxygen Used Continuous;E-Tanks   Liters per minute 1  1-2 liters      Oxygen Re-Evaluation:     Oxygen Re-Evaluation    Row Name 12/04/16 0930 12/04/16 0931 12/06/16 0858 12/06/16 0900 01/01/17 1642     Program Oxygen Prescription   Program Oxygen Prescription Continuous  - Continuous  - Continuous   Liters per minute 2  - 2  - 2     Home Oxygen   Home Oxygen Device Home Concentrator;E-Tanks  - Home Concentrator;E-Tanks  - Home Concentrator;E-Tanks   Sleep Oxygen Prescription CPAP  -  - CPAP CPAP   Home Exercise Oxygen Prescription Continuous Continuous Continuous  - Continuous   Liters per minute  - 2 2  - 2   Home at Rest Exercise Oxygen Prescription  - Continuous Continuous  - Continuous   Liters per minute  - 2 2  - 2   Compliance with Home Oxygen Use  - Yes Yes  - No     Goals/Expected Outcomes   Short Term Goals  - To learn and exhibit compliance with exercise, home and travel O2  prescription;To learn and understand importance of monitoring SPO2 with pulse oximeter and demonstrate accurate use of the pulse oximeter.;To Learn and understand importance of maintaining oxygen saturations>88%;To learn and demonstrate proper purse lipped  breathing techniques or other breathing techniques.  - To learn and exhibit compliance with exercise, home and travel O2 prescription To learn and exhibit compliance with exercise, home and travel O2 prescription;To learn and understand importance of monitoring SPO2 with pulse oximeter and demonstrate accurate use of the pulse oximeter.;To Learn and understand importance of maintaining oxygen saturations>88%;To learn and demonstrate proper purse lipped breathing techniques or other breathing techniques.;To learn and demonstrate proper use of respiratory medications   Long  Term Goals  - Exhibits compliance with exercise, home and travel O2 prescription  - Exhibits compliance with exercise, home and travel O2 prescription Exhibits compliance with exercise, home and travel O2 prescription;Verbalizes importance of monitoring SPO2 with pulse oximeter and return demonstration;Maintenance of O2 saturations>88%;Exhibits proper breathing techniques, such as purse lipped breathing or other method taught during program session   Comments  - Is compliant with oxygen at this time  - Is compliant with oxygen at this time pt remains compliant with her home oxygen prescription accept when at work. her portable tanks are too heavy for her to carry. she is working with [facility] to obtain a poc.   Goals/Expected Outcomes  - continued compliance  - continued compliance she will verbalize compliance with O2   Row Name 01/30/17 2139             Program Oxygen Prescription   Program Oxygen Prescription None         Home Oxygen   Home Oxygen Device Home Concentrator;E-Tanks       Sleep Oxygen Prescription CPAP       Home Exercise Oxygen Prescription None       Home  at Rest Exercise Oxygen Prescription None          Oxygen Discharge (Final Oxygen Re-Evaluation):     Oxygen Re-Evaluation - 01/30/17 2139      Program Oxygen Prescription   Program Oxygen Prescription None     Home Oxygen   Home Oxygen Device Home Concentrator;E-Tanks   Sleep Oxygen Prescription CPAP   Home Exercise Oxygen Prescription None   Home at Rest Exercise Oxygen Prescription None      Initial Exercise Prescription:   Perform Capillary Blood Glucose checks as needed.  Exercise Prescription Changes:     Exercise Prescription Changes    Row Name 12/11/16 1500 12/13/16 1500 12/18/16 1500 12/25/16 1500 12/27/16 1500     Response to Exercise   Blood Pressure (Admit) 104/60 110/60 104/60 100/70 98/60   Blood Pressure (Exercise) 100/54 118/50 130/64 96/54 94/64    Blood Pressure (Exit) 104/54 108/70 98/60 106/60 98/60   Heart Rate (Admit) 60 bpm 58 bpm 59 bpm 57 bpm 56 bpm   Heart Rate (Exercise) 67 bpm 80 bpm 64 bpm 68 bpm 72 bpm   Heart Rate (Exit) 61 bpm 60 bpm 61 bpm 60 bpm 58 bpm   Oxygen Saturation (Admit) 100 % 100 % 98 % 99 % 100 %   Oxygen Saturation (Exercise) 99 % 99 % 99 % 100 % 100 %   Oxygen Saturation (Exit) 100 % 100 % 98 % 99 % 100 %   Rating of Perceived Exertion (Exercise) 11 13 11 11 13    Perceived Dyspnea (Exercise) 0 2 1 1  0   Duration Progress to 45 minutes of aerobic exercise without signs/symptoms of physical distress Progress to 45 minutes of aerobic exercise without signs/symptoms of physical distress Progress to 45 minutes of aerobic exercise without signs/symptoms of physical distress Progress to 45 minutes of  aerobic exercise without signs/symptoms of physical distress Progress to 45 minutes of aerobic exercise without signs/symptoms of physical distress   Intensity THRR unchanged THRR unchanged THRR unchanged THRR unchanged THRR unchanged     Progression   Progression Continue to progress workloads to maintain intensity without  signs/symptoms of physical distress. Continue to progress workloads to maintain intensity without signs/symptoms of physical distress. Continue to progress workloads to maintain intensity without signs/symptoms of physical distress. Continue to progress workloads to maintain intensity without signs/symptoms of physical distress. Continue to progress workloads to maintain intensity without signs/symptoms of physical distress.     Resistance Training   Training Prescription Yes Yes Yes Yes Yes   Weight orange bands orange bands orange bands orange bands orange bands   Reps 10-15 10-15 10-15 10-15 10-15   Time 10 Minutes 10 Minutes 10 Minutes 10 Minutes 10 Minutes     Interval Training   Interval Training No No No No No     Oxygen   Oxygen Continuous Continuous Continuous Continuous Continuous   Liters 2 2 2 2 2      NuStep   Level 2 2 2 2   -   Minutes 17 17 17 17   -   METs 1.5 1.7 1.7 1.7  -     Arm Ergometer   Level 2  reduced workload d/t shoulder pinched nerve 2 2 3 3    Minutes 17 17 17 17 17      Track   Laps 8  - 8 8 9    Minutes 17  - 17 17 17    Row Name 01/01/17 1500 01/03/17 1500 01/08/17 1500 01/10/17 1500 01/15/17 1500     Response to Exercise   Blood Pressure (Admit) 96/62 100/60 110/56 104/64 96/56   Blood Pressure (Exercise) 120/60 96/64 130/62 130/60 112/68   Blood Pressure (Exit) 102/56 102/70 110/82 104/48 122/62   Heart Rate (Admit) 61 bpm 62 bpm 64 bpm 65 bpm 66 bpm   Heart Rate (Exercise) 76 bpm 85 bpm 71 bpm 77 bpm 83 bpm   Heart Rate (Exit) 58 bpm 63 bpm 59 bpm 66 bpm 69 bpm   Oxygen Saturation (Admit) 99 % 100 % 99 % 100 % 100 %   Oxygen Saturation (Exercise) 99 % 97 % 99 % 97 % 98 %   Oxygen Saturation (Exit) 97 % 98 % 98 % 100 % 99 %   Rating of Perceived Exertion (Exercise) 13 13 13 11 11    Perceived Dyspnea (Exercise) 1 1 1  0 1   Duration Progress to 45 minutes of aerobic exercise without signs/symptoms of physical distress Progress to 45 minutes of  aerobic exercise without signs/symptoms of physical distress Progress to 45 minutes of aerobic exercise without signs/symptoms of physical distress Progress to 45 minutes of aerobic exercise without signs/symptoms of physical distress Progress to 45 minutes of aerobic exercise without signs/symptoms of physical distress   Intensity THRR unchanged THRR unchanged THRR unchanged THRR unchanged THRR unchanged     Progression   Progression Continue to progress workloads to maintain intensity without signs/symptoms of physical distress. Continue to progress workloads to maintain intensity without signs/symptoms of physical distress. Continue to progress workloads to maintain intensity without signs/symptoms of physical distress. Continue to progress workloads to maintain intensity without signs/symptoms of physical distress. Continue to progress workloads to maintain intensity without signs/symptoms of physical distress.     Resistance Training   Training Prescription Yes Yes Yes Yes Yes   Weight orange bands orange bands  orange bands orange bands orange bands   Reps 10-15 10-15 10-15 10-15 10-15   Time 10 Minutes 10 Minutes 10 Minutes 10 Minutes 10 Minutes     Interval Training   Interval Training No No No No No     Oxygen   Oxygen Continuous  -  -  -  -   Liters 2  -  -  -  -     NuStep   Level 4  - 4 5 5    Minutes 17  - 17 17 17    METs 1.8  - 1.4 2 2      Track   Laps 22 14 18 8 11    Minutes 34 34 34 17 34     Home Exercise Plan   Plans to continue exercise at  -  - Home (comment)  -  -   Frequency  -  - Add 3 additional days to program exercise sessions.  -  -   Row Name 01/17/17 1547 01/22/17 1500 01/24/17 1600         Response to Exercise   Blood Pressure (Admit) 120/70 104/58 130/72     Blood Pressure (Exercise) 110/60 138/70 104/62     Blood Pressure (Exit) 100/66 112/58 98/62     Heart Rate (Admit) 66 bpm 63 bpm 64 bpm     Heart Rate (Exercise) 81 bpm 71 bpm 78 bpm      Heart Rate (Exit) 73 bpm 60 bpm 59 bpm     Oxygen Saturation (Admit) 99 % 98 % 100 %     Oxygen Saturation (Exercise) 96 % 97 % 97 %     Oxygen Saturation (Exit) 99 % 99 % 98 %     Rating of Perceived Exertion (Exercise) 12 13 12      Perceived Dyspnea (Exercise) 1 1 1      Duration Progress to 45 minutes of aerobic exercise without signs/symptoms of physical distress Progress to 45 minutes of aerobic exercise without signs/symptoms of physical distress Progress to 45 minutes of aerobic exercise without signs/symptoms of physical distress     Intensity THRR unchanged THRR unchanged THRR unchanged       Progression   Progression Continue to progress workloads to maintain intensity without signs/symptoms of physical distress. Continue to progress workloads to maintain intensity without signs/symptoms of physical distress. Continue to progress workloads to maintain intensity without signs/symptoms of physical distress.       Resistance Training   Training Prescription Yes Yes Yes     Weight orange bands orange bands orange bands     Reps 10-15 10-15 10-15     Time 10 Minutes 10 Minutes 10 Minutes       Interval Training   Interval Training No No No       NuStep   Level 5 6 6      Minutes 17 17 17      METs 1.8 2.2 2.3       Track   Laps 19 15  -     Minutes 34 34 17        Exercise Comments:     Exercise Comments    Row Name 01/08/17 1701           Exercise Comments Home exercise completed          Exercise Goals and Review:   Exercise Goals Re-Evaluation :     Exercise Goals Re-Evaluation    Row Name 12/03/16 1143 12/28/16 1046 01/29/17 4196  Exercise Goal Re-Evaluation   Exercise Goals Review Increase Physical Activity;Increase Strenth and Stamina Increase Strenth and Stamina;Increase Physical Activity Increase Strength and Stamina;Increase Physical Activity;Able to understand and use Dyspnea scale;Able to understand and use rate of perceived exertion (RPE)  scale;Knowledge and understanding of Target Heart Rate Range (THRR);Understanding of Exercise Prescription     Comments Patient has only attended three exercise sessions. Will cont. to monitor and progress as able.  Patient is making slow progress. She has difficulty due to orthopedic issues. We have taken her off the arm ergometer and placed her on the walking track for two stations or 30 minutes. This will help her in reaching her weight loss goal. Will cont. to monitor and progress patient as able.  Patient is progressing well. Limited by orthopedic issues. D/c oxygen for exercise. Will cont. to monitor and progress when able.      Expected Outcomes Through exercising at rehab and at home, patient will increase physical strength and stamina and find ADL's easier to perform.  Through exercising at rehab and at home, patient will increase physical strength and stamina and find ADL's easier to perform.  Through exercising at rehab and at home, patient will increase physical strength and stamina and find ADL's easier to perform.         Discharge Exercise Prescription (Final Exercise Prescription Changes):     Exercise Prescription Changes - 01/24/17 1600      Response to Exercise   Blood Pressure (Admit) 130/72   Blood Pressure (Exercise) 104/62   Blood Pressure (Exit) 98/62   Heart Rate (Admit) 64 bpm   Heart Rate (Exercise) 78 bpm   Heart Rate (Exit) 59 bpm   Oxygen Saturation (Admit) 100 %   Oxygen Saturation (Exercise) 97 %   Oxygen Saturation (Exit) 98 %   Rating of Perceived Exertion (Exercise) 12   Perceived Dyspnea (Exercise) 1   Duration Progress to 45 minutes of aerobic exercise without signs/symptoms of physical distress   Intensity THRR unchanged     Progression   Progression Continue to progress workloads to maintain intensity without signs/symptoms of physical distress.     Resistance Training   Training Prescription Yes   Weight orange bands   Reps 10-15   Time 10  Minutes     Interval Training   Interval Training No     NuStep   Level 6   Minutes 17   METs 2.3     Track   Minutes 17      Nutrition:  Target Goals: Understanding of nutrition guidelines, daily intake of sodium 1500mg , cholesterol 200mg , calories 30% from fat and 7% or less from saturated fats, daily to have 5 or more servings of fruits and vegetables.  Biometrics:    Nutrition Therapy Plan and Nutrition Goals:     Nutrition Therapy & Goals - 12/18/16 1516      Nutrition Therapy   Diet Carb Modified, Therapeutic Lifestyle Changes     Personal Nutrition Goals   Nutrition Goal Identify food quantities necessary to achieve wt loss of  -2# per week to a goal wt loss of 2.7-10.9 kg (6-24 lb) at graduation from pulmonary rehab.     Intervention Plan   Intervention Prescribe, educate and counsel regarding individualized specific dietary modifications aiming towards targeted core components such as weight, hypertension, lipid management, diabetes, heart failure and other comorbidities.   Expected Outcomes Short Term Goal: Understand basic principles of dietary content, such as calories, fat, sodium, cholesterol and  nutrients.;Long Term Goal: Adherence to prescribed nutrition plan.      Nutrition Discharge: Rate Your Plate Scores:     Nutrition Assessments - 12/17/16 0946      Rate Your Plate Scores   Pre Score 58      Nutrition Goals Re-Evaluation:   Nutrition Goals Discharge (Final Nutrition Goals Re-Evaluation):   Psychosocial: Target Goals: Acknowledge presence or absence of significant depression and/or stress, maximize coping skills, provide positive support system. Participant is able to verbalize types and ability to use techniques and skills needed for reducing stress and depression.  Initial Review & Psychosocial Screening:   Quality of Life Scores:   PHQ-9: Recent Review Flowsheet Data    Depression screen [facility] 2/9 11/09/2016   Decreased  Interest 0   Down, Depressed, Hopeless 0   PHQ - 2 Score 0     Interpretation of Total Score  Total Score Depression Severity:  1-4 = Minimal depression, 5-9 = Mild depression, 10-14 = Moderate depression, 15-19 = Moderately severe depression, 20-27 = Severe depression   Psychosocial Evaluation and Intervention:   Psychosocial Re-Evaluation:     Psychosocial Re-Evaluation    [employer] Name 12/04/16 [phone] 01/01/17 1652 01/30/17 2141         Psychosocial Re-Evaluation   Current issues with None Identified None Identified None Identified     Expected Outcomes  -  - patient will remain free from psychosocial barriers to participation in pulmonary rehab     Interventions Encouraged to attend Pulmonary Rehabilitation for the exercise Encouraged to attend Pulmonary Rehabilitation for the exercise Encouraged to attend Pulmonary Rehabilitation for the exercise     Continue Psychosocial Services  No Follow up required No Follow up required No Follow up required        Psychosocial Discharge (Final Psychosocial Re-Evaluation):     Psychosocial Re-Evaluation - 01/30/17 2141      Psychosocial Re-Evaluation   Current issues with None Identified   Expected Outcomes patient will remain free from psychosocial barriers to participation in pulmonary rehab   Interventions Encouraged to attend Pulmonary Rehabilitation for the exercise   Continue Psychosocial Services  No Follow up required      Education: Education Goals: Education classes will be provided on a weekly basis, covering required topics. Participant will state understanding/return demonstration of topics presented.  Learning Barriers/Preferences:   Education Topics: Risk Factor Reduction:  -Group instruction that is supported by a PowerPoint presentation. Instructor discusses the definition of a risk factor, different risk factors for pulmonary disease, and how the heart and lungs work together.     PULMONARY REHAB OTHER  RESPIRATORY from 01/24/2017 in [place]  Date  12/27/16  Educator  EP  Instruction Review Code  2- meets goals/outcomes      Nutrition for Pulmonary Patient:  -Group instruction provided by PowerPoint slides, verbal discussion, and written materials to support subject matter. The instructor gives an explanation and review of healthy diet recommendations, which includes a discussion on weight management, recommendations for fruit and vegetable consumption, as well as protein, fluid, caffeine, fiber, sodium, sugar, and alcohol. Tips for eating when patients are short of breath are discussed.   PULMONARY REHAB OTHER RESPIRATORY from 01/24/2017 in [place]  Date  01/24/17  Educator  RD  Instruction Review Code  2- meets goals/outcomes      Pursed Lip Breathing:  -Group instruction that is supported by demonstration and informational handouts. Instructor discusses the benefits  of pursed lip and diaphragmatic breathing and detailed demonstration on how to preform both.     Oxygen Safety:  -Group instruction provided by PowerPoint, verbal discussion, and written material to support subject matter. There is an overview of "What is Oxygen" and "Why do we need it".  Instructor also reviews how to create a safe environment for oxygen use, the importance of using oxygen as prescribed, and the risks of noncompliance. There is a brief discussion on traveling with oxygen and resources the patient may utilize.   PULMONARY REHAB OTHER RESPIRATORY from 01/24/2017 in [place]  Date  01/10/17  Educator  Truddie Crumble  Instruction Review Code  2- meets goals/outcomes      Oxygen Equipment:  -Group instruction provided by [employer] Staff utilizing handouts, written materials, and equipment demonstrations.   Signs and Symptoms:  -Group instruction provided by written material and verbal discussion to support  subject matter. Warning signs and symptoms of infection, stroke, and heart attack are reviewed and when to call the physician/911 reinforced. Tips for preventing the spread of infection discussed.   PULMONARY REHAB OTHER RESPIRATORY from 01/24/2017 in [place]  Date  12/13/16  Educator  rn  Instruction Review Code  2- meets goals/outcomes      Advanced Directives:  -Group instruction provided by verbal instruction and written material to support subject matter. Instructor reviews Advanced Directive laws and proper instruction for filling out document.   Pulmonary Video:  -Group video education that reviews the importance of medication and oxygen compliance, exercise, good nutrition, pulmonary hygiene, and pursed lip and diaphragmatic breathing for the pulmonary patient.   Exercise for the Pulmonary Patient:  -Group instruction that is supported by a PowerPoint presentation. Instructor discusses benefits of exercise, core components of exercise, frequency, duration, and intensity of an exercise routine, importance of utilizing pulse oximetry during exercise, safety while exercising, and options of places to exercise outside of rehab.     PULMONARY REHAB OTHER RESPIRATORY from 01/24/2017 in [place]  Date  11/29/16  Educator  ep  Instruction Review Code  2- meets goals/outcomes      Pulmonary Medications:  -Verbally interactive group education provided by instructor with focus on inhaled medications and proper administration.   Anatomy and Physiology of the Respiratory System and Intimacy:  -Group instruction provided by PowerPoint, verbal discussion, and written material to support subject matter. Instructor reviews respiratory cycle and anatomical components of the respiratory system and their functions. Instructor also reviews differences in obstructive and restrictive respiratory diseases with examples of each.  Intimacy, Sex, and Sexuality differences are reviewed with a discussion on how relationships can change when diagnosed with pulmonary disease. Common sexual concerns are reviewed.   PULMONARY REHAB OTHER RESPIRATORY from 01/24/2017 in [place]  Date  12/20/16  Educator  Rn  Instruction Review Code  2- meets goals/outcomes      MD DAY -A group question and answer session with a medical doctor that allows participants to ask questions that relate to their pulmonary disease state.   OTHER EDUCATION -Group or individual verbal, written, or video instructions that support the educational goals of the pulmonary rehab program.   Knowledge Questionnaire Score:     Knowledge Questionnaire Score - 12/04/16 1548      Knowledge Questionnaire Score   Pre Score 11/13      Core Components/Risk Factors/Patient Goals at Admission:   Core Components/Risk  Factors/Patient Goals Review:      Goals and Risk Factor Review    Row Name 12/04/16 0934 12/06/16 0900 01/01/17 1648 01/30/17 2139       Core Components/Risk Factors/Patient Goals Review   Personal Goals Review Weight Management/Obesity;Improve shortness of breath with ADL's;Develop more efficient breathing techniques such as purse lipped breathing and diaphragmatic breathing and practicing self-pacing with activity. Weight Management/Obesity;Improve shortness of breath with ADL's;Develop more efficient breathing techniques such as purse lipped breathing and diaphragmatic breathing and practicing self-pacing with activity. Weight Management/Obesity;Improve shortness of breath with ADL's;Develop more efficient breathing techniques such as purse lipped breathing and diaphragmatic breathing and practicing self-pacing with activity. Weight Management/Obesity;Improve shortness of breath with ADL's;Develop more efficient breathing techniques such as purse lipped breathing and diaphragmatic breathing and practicing  self-pacing with activity.    Review No weight loss, has exercised in 3 exercise sessions, too early to see progress  - patient continues to make slow progress towards pulmonary rehab goals. she has not began to loose weight however an adjustment in her exercise prescription will hopefully address this issue. she is utilizing pursed lip breathing when prompted and continues to self pace. 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 patient has not been able to progress toward weightloss goals however she sees a significant improvement in her shortness of breath. she has been able to maintain oxygen saturations >92 with exertion on room air. dr bensimhon has given Kamren permission to discontinue her oxygen    Expected Outcomes expect progression towards goals in the next 30 days  - expect progression towards goals in the next 30 days expect progression towards goals in the next 30 days       Core Components/Risk Factors/Patient Goals at Discharge (Final Review):      Goals and Risk Factor Review - 01/30/17 2139      Core Components/Risk Factors/Patient Goals Review   Personal Goals Review Weight Management/Obesity;Improve shortness of breath with ADL's;Develop more efficient breathing techniques such as purse lipped breathing and diaphragmatic breathing and practicing self-pacing with activity.   Review patient has not been able to progress toward weightloss goals however she sees a significant improvement in her shortness of breath. she has been able to maintain oxygen saturations >92 with exertion on room air. dr bensimhon has given Willie permission to discontinue her oxygen   Expected Outcomes expect progression towards goals in the next 30 days      ITP Comments:   Comments: ITP REVIEW Pt is making expected progress toward pulmonary rehab goals after completing 18 sessions. Recommend continued  exercise, life style modification, education, and utilization of breathing techniques to increase stamina and strength and decrease shortness of breath with exertion.

## 2017-01-31 ENCOUNTER — Encounter (HOSPITAL_COMMUNITY): Payer: 59

## 2017-02-05 ENCOUNTER — Encounter (HOSPITAL_COMMUNITY)
Admission: RE | Admit: 2017-02-05 | Discharge: 2017-02-05 | Disposition: A | Discharge status: Home / Self Care | Payer: 59 | Source: Ambulatory Visit | Attending: Internal Medicine | Admitting: Internal Medicine

## 2017-02-05 VITALS — Wt 210.8 lb

## 2017-02-05 DIAGNOSIS — I272 Pulmonary hypertension, unspecified: Secondary | ICD-10-CM | POA: Insufficient documentation

## 2017-02-05 NOTE — Progress Notes (Signed)
Daily Session Note  Patient Details  Name: [name] MRN: [id number] Date of Birth: August 18, 1935 Referring Provider:     Pulmonary Rehab Walk Test from 11/13/2016 in [place]  Referring Provider  Dr. Haroldine Laws      Encounter Date: 02/05/2017  Check In:     Session Check In - 02/05/17 1330      Check-In   Location [facility]   Staff Present Su Hilt, MS, ACSM RCEP, Exercise Physiologist;Kennice Finnie Ysidro Evert, RN;Other;Portia Rollene Rotunda, RN, BSN   Supervising physician immediately available to respond to emergencies [facility] immediately available   Physician(s) Dr. Bonner Puna   Medication changes reported     No   Fall or balance concerns reported    No   Tobacco Cessation No Change   Warm-up and Cool-down Performed as group-led instruction   Resistance Training Performed Yes   VAD Patient? No     Pain Assessment   Currently in Pain? No/denies   Multiple Pain Sites No      Capillary Blood Glucose: No results found for this or any previous visit (from the past 24 hour(s)).      Exercise Prescription Changes - 02/05/17 1500      Response to Exercise   Blood Pressure (Admit) 132/78   Blood Pressure (Exercise) 140/78   Blood Pressure (Exit) 110/60   Heart Rate (Admit) 58 bpm   Heart Rate (Exercise) 83 bpm   Heart Rate (Exit) 72 bpm   Oxygen Saturation (Admit) 99 %   Oxygen Saturation (Exercise) 97 %   Oxygen Saturation (Exit) 94 %   Rating of Perceived Exertion (Exercise) 12   Perceived Dyspnea (Exercise) 1   Duration Progress to 45 minutes of aerobic exercise without signs/symptoms of physical distress   Intensity THRR unchanged     Progression   Progression Continue to progress workloads to maintain intensity without signs/symptoms of physical distress.     Resistance Training   Training Prescription Yes   Weight orange bands   Reps 10-15   Time 10 Minutes     Interval Training   Interval Training No      NuStep   Level 6   Minutes 17   METs 2.2     Track   Laps 13   Minutes 34      History  Smoking Status  . Never Smoker  Smokeless Tobacco  . Never Used    Goals Met:  Exercise tolerated well No report of cardiac concerns or symptoms Strength training completed today  Goals Unmet:  Not Applicable  Comments: Service time is from 1330 to 1500    Dr. Rush Farmer is Medical Director for Pulmonary Rehab at [facility].

## 2017-02-07 ENCOUNTER — Encounter (HOSPITAL_COMMUNITY)
Admission: RE | Admit: 2017-02-07 | Discharge: 2017-02-07 | Disposition: A | Discharge status: Home / Self Care | Payer: 59 | Source: Ambulatory Visit | Attending: Internal Medicine | Admitting: Internal Medicine

## 2017-02-07 VITALS — Wt 213.6 lb

## 2017-02-07 DIAGNOSIS — I272 Pulmonary hypertension, unspecified: Secondary | ICD-10-CM

## 2017-02-07 NOTE — Progress Notes (Signed)
Daily Session Note  Patient Details  Name: [name] MRN: [id number] Date of Birth: 08/24/1935 Referring Provider:     Pulmonary Rehab Walk Test from 11/13/2016 in [place]  Referring Provider  Dr. Haroldine Laws      Encounter Date: 02/07/2017  Check In:     Session Check In - 02/07/17 1534      Check-In   [facility]   Staff Present Su Hilt, MS, ACSM RCEP, Exercise Physiologist;Lisa Ysidro Evert, RN;Other;Ziah Turvey Rollene Rotunda, RN, BSN   Supervising physician immediately available to respond to emergencies [facility] immediately available   Physician(s) Dr. Bonner Puna   Medication changes reported     No   Fall or balance concerns reported    No   Tobacco Cessation No Change   Warm-up and Cool-down Performed as group-led instruction   Resistance Training Performed Yes   VAD Patient? No     Pain Assessment   Currently in Pain? No/denies   Multiple Pain Sites No      Capillary Blood Glucose: No results found for this or any previous visit (from the past 24 hour(s)).      Exercise Prescription Changes - 02/07/17 1531      Response to Exercise   Blood Pressure (Admit) 118/62   Blood Pressure (Exercise) 114/64   Blood Pressure (Exit) 112/50   Heart Rate (Admit) 61 bpm   Heart Rate (Exercise) 70 bpm   Heart Rate (Exit) 62 bpm   Oxygen Saturation (Admit) 97 %   Oxygen Saturation (Exercise) 99 %   Oxygen Saturation (Exit) 98 %   Rating of Perceived Exertion (Exercise) 13   Perceived Dyspnea (Exercise) 1   Duration Progress to 45 minutes of aerobic exercise without signs/symptoms of physical distress   Intensity THRR unchanged     Progression   Progression Continue to progress workloads to maintain intensity without signs/symptoms of physical distress.     Resistance Training   Training Prescription Yes   Weight orange bands   Reps 10-15   Time 10 Minutes     Interval Training   Interval Training No      NuStep   Level 6   Minutes 17   METs 2.4     Track   Laps 10   Minutes 34      History  Smoking Status  . Never Smoker  Smokeless Tobacco  . Never Used    Goals Met:  Independence with exercise equipment Improved SOB with ADL's Using PLB without cueing & demonstrates good technique Exercise tolerated well No report of cardiac concerns or symptoms Strength training completed today  Goals Unmet:  Not Applicable  Comments: Service time is from 1330 to 1500   Dr. Rush Farmer is Medical Director for Pulmonary Rehab at [facility].

## 2017-02-12 ENCOUNTER — Encounter (HOSPITAL_COMMUNITY)
Admission: RE | Admit: 2017-02-12 | Discharge: 2017-02-12 | Disposition: A | Discharge status: Home / Self Care | Payer: 59 | Source: Ambulatory Visit | Attending: Internal Medicine | Admitting: Internal Medicine

## 2017-02-12 VITALS — Wt 215.2 lb

## 2017-02-12 DIAGNOSIS — I272 Pulmonary hypertension, unspecified: Secondary | ICD-10-CM

## 2017-02-12 NOTE — Progress Notes (Signed)
Daily Session Note  Patient Details  Name: [name] MRN: [id number] Date of Birth: [birth year] Referring Provider:     Pulmonary Rehab Walk Test from 11/13/2016 in [place]  Referring Provider  Dr. Haroldine Laws      Encounter Date: 02/12/2017  Check In:     Session Check In - 02/12/17 1330      Check-In   [facility]   Staff Present Ramon Dredge, RN, MHA;Yoana Staib Rollene Rotunda, RN, BSN;Molly diVincenzo, MS, ACSM RCEP, Exercise Physiologist   Supervising physician immediately available to respond to emergencies [facility] immediately available   Physician(s) Dr. Bonner Puna   Medication changes reported     No   Fall or balance concerns reported    No   Tobacco Cessation No Change   Resistance Training Performed Yes   VAD Patient? No     Pain Assessment   Currently in Pain? No/denies   Multiple Pain Sites No      Capillary Blood Glucose: No results found for this or any previous visit (from the past 24 hour(s)).      Exercise Prescription Changes - 02/12/17 1553      Response to Exercise   Blood Pressure (Admit) 100/66   Blood Pressure (Exercise) 130/70   Blood Pressure (Exit) 104/60   Heart Rate (Admit) 54 bpm   Heart Rate (Exercise) 98 bpm   Heart Rate (Exit) 58 bpm   Oxygen Saturation (Admit) 100 %   Oxygen Saturation (Exercise) 98 %   Oxygen Saturation (Exit) 99 %   Rating of Perceived Exertion (Exercise) 12   Perceived Dyspnea (Exercise) 0   Duration Progress to 45 minutes of aerobic exercise without signs/symptoms of physical distress   Intensity THRR unchanged     Progression   Progression Continue to progress workloads to maintain intensity without signs/symptoms of physical distress.     Resistance Training   Training Prescription Yes   Weight orange bands   Reps 10-15   Time 10 Minutes     Interval Training   Interval Training No     NuStep   Level 6   Minutes 17   METs 2.4     Track   Laps 11   Minutes 17      History  Smoking Status  . Never Smoker  Smokeless Tobacco  . Never Used    Goals Met:  Independence with exercise equipment Improved SOB with ADL's Using PLB without cueing & demonstrates good technique Exercise tolerated well No report of cardiac concerns or symptoms Strength training completed today  Goals Unmet:  Not Applicable  Comments: Service time is from 1330 to 1530   Dr. Rush Farmer is Medical Director for Pulmonary Rehab at [facility].

## 2017-02-13 ENCOUNTER — Other Ambulatory Visit (HOSPITAL_COMMUNITY): Payer: Self-pay | Admitting: Internal Medicine

## 2017-02-14 ENCOUNTER — Encounter (HOSPITAL_COMMUNITY): Payer: 59

## 2017-02-14 DIAGNOSIS — H5203 Hypermetropia, bilateral: Secondary | ICD-10-CM | POA: Diagnosis not present

## 2017-02-14 DIAGNOSIS — H524 Presbyopia: Secondary | ICD-10-CM | POA: Diagnosis not present

## 2017-02-14 DIAGNOSIS — H04123 Dry eye syndrome of bilateral lacrimal glands: Secondary | ICD-10-CM | POA: Diagnosis not present

## 2017-02-14 DIAGNOSIS — H52223 Regular astigmatism, bilateral: Secondary | ICD-10-CM | POA: Diagnosis not present

## 2017-02-14 DIAGNOSIS — H2513 Age-related nuclear cataract, bilateral: Secondary | ICD-10-CM | POA: Diagnosis not present

## 2017-02-14 DIAGNOSIS — H35033 Hypertensive retinopathy, bilateral: Secondary | ICD-10-CM | POA: Diagnosis not present

## 2017-02-19 ENCOUNTER — Encounter (HOSPITAL_COMMUNITY)
Admission: RE | Admit: 2017-02-19 | Discharge: 2017-02-19 | Disposition: A | Discharge status: Home / Self Care | Payer: 59 | Source: Ambulatory Visit | Attending: Internal Medicine | Admitting: Internal Medicine

## 2017-02-19 VITALS — Wt 211.6 lb

## 2017-02-19 DIAGNOSIS — I272 Pulmonary hypertension, unspecified: Secondary | ICD-10-CM | POA: Diagnosis not present

## 2017-02-19 NOTE — Progress Notes (Signed)
Daily Session Note  Patient Details  Name: [name] MRN: [id number] Date of Birth: 01/14/1936 Referring Provider:     Pulmonary Rehab Walk Test from 11/13/2016 in [facility]  Referring Provider  Dr. Bensimhon      Encounter Date: 02/19/2017  Check In:     Session Check In - 02/19/17 1330      Check-In   [facility]   Staff Present Molly diVincenzo, MS, ACSM RCEP, Exercise Physiologist;Lisa Hughes, RN;Portia Payne, RN, BSN   Supervising physician immediately available to respond to emergencies [facility] immediately available   Physician(s) Dr. Bhandari   Medication changes reported     No   Fall or balance concerns reported    No   Tobacco Cessation No Change   Warm-up and Cool-down Performed as group-led instruction   Resistance Training Performed Yes   VAD Patient? No     Pain Assessment   Currently in Pain? No/denies   Multiple Pain Sites No      Capillary Blood Glucose: No results found for this or any previous visit (from the past 24 hour(s)).      Exercise Prescription Changes - 02/19/17 1612      Response to Exercise   Blood Pressure (Admit) 110/66   Blood Pressure (Exercise) 128/74   Blood Pressure (Exit) 106/62   Heart Rate (Admit) 60 bpm   Heart Rate (Exercise) 80 bpm   Heart Rate (Exit) 66 bpm   Oxygen Saturation (Admit) 100 %   Oxygen Saturation (Exercise) 95 %   Oxygen Saturation (Exit) 99 %   Rating of Perceived Exertion (Exercise) 12   Perceived Dyspnea (Exercise) 0   Duration Progress to 45 minutes of aerobic exercise without signs/symptoms of physical distress   Intensity THRR unchanged     Progression   Progression Continue to progress workloads to maintain intensity without signs/symptoms of physical distress.     Resistance Training   Training Prescription Yes   Weight orange bands   Reps 10-15   Time 10 Minutes     Interval Training   Interval Training No     NuStep   Level 6   Minutes 17   METs 2.3     Track   Laps 21   Minutes 34      History  Smoking Status  . Never Smoker  Smokeless Tobacco  . Never Used    Goals Met:  Independence with exercise equipment Improved SOB with ADL's Using PLB without cueing & demonstrates good technique Exercise tolerated well No report of cardiac concerns or symptoms Strength training completed today  Goals Unmet:  Not Applicable  Comments: Service time is from 1330 to 1505   Dr. Wesam G. Yacoub is Medical Director for Pulmonary Rehab at [facility]. 

## 2017-02-21 ENCOUNTER — Encounter (HOSPITAL_COMMUNITY)
Admission: RE | Admit: 2017-02-21 | Discharge: 2017-02-21 | Disposition: A | Discharge status: Home / Self Care | Payer: 59 | Source: Ambulatory Visit | Attending: Internal Medicine | Admitting: Internal Medicine

## 2017-02-21 ENCOUNTER — Ambulatory Visit (HOSPITAL_COMMUNITY)
Admission: RE | Admit: 2017-02-21 | Discharge: 2017-02-21 | Disposition: A | Discharge status: Home / Self Care | Payer: 59 | Source: Ambulatory Visit | Attending: Internal Medicine | Admitting: Internal Medicine

## 2017-02-21 VITALS — BP 136/74 | HR 64 | Wt 213.2 lb

## 2017-02-21 DIAGNOSIS — Z7982 Long term (current) use of aspirin: Secondary | ICD-10-CM | POA: Diagnosis not present

## 2017-02-21 DIAGNOSIS — I272 Pulmonary hypertension, unspecified: Secondary | ICD-10-CM | POA: Diagnosis not present

## 2017-02-21 DIAGNOSIS — E559 Vitamin D deficiency, unspecified: Secondary | ICD-10-CM | POA: Insufficient documentation

## 2017-02-21 DIAGNOSIS — I5032 Chronic diastolic (congestive) heart failure: Secondary | ICD-10-CM | POA: Insufficient documentation

## 2017-02-21 DIAGNOSIS — Z885 Allergy status to narcotic agent status: Secondary | ICD-10-CM | POA: Insufficient documentation

## 2017-02-21 DIAGNOSIS — E1122 Type 2 diabetes mellitus with diabetic chronic kidney disease: Secondary | ICD-10-CM | POA: Diagnosis not present

## 2017-02-21 DIAGNOSIS — I6523 Occlusion and stenosis of bilateral carotid arteries: Secondary | ICD-10-CM | POA: Insufficient documentation

## 2017-02-21 DIAGNOSIS — K219 Gastro-esophageal reflux disease without esophagitis: Secondary | ICD-10-CM | POA: Diagnosis not present

## 2017-02-21 DIAGNOSIS — Z7984 Long term (current) use of oral hypoglycemic drugs: Secondary | ICD-10-CM | POA: Insufficient documentation

## 2017-02-21 DIAGNOSIS — Z833 Family history of diabetes mellitus: Secondary | ICD-10-CM | POA: Diagnosis not present

## 2017-02-21 DIAGNOSIS — I10 Essential (primary) hypertension: Secondary | ICD-10-CM | POA: Diagnosis present

## 2017-02-21 DIAGNOSIS — I130 Hypertensive heart and chronic kidney disease with heart failure and stage 1 through stage 4 chronic kidney disease, or unspecified chronic kidney disease: Secondary | ICD-10-CM | POA: Insufficient documentation

## 2017-02-21 DIAGNOSIS — E785 Hyperlipidemia, unspecified: Secondary | ICD-10-CM | POA: Insufficient documentation

## 2017-02-21 DIAGNOSIS — N183 Chronic kidney disease, stage 3 (moderate): Secondary | ICD-10-CM | POA: Insufficient documentation

## 2017-02-21 DIAGNOSIS — Z6841 Body Mass Index (BMI) 40.0 and over, adult: Secondary | ICD-10-CM | POA: Insufficient documentation

## 2017-02-21 DIAGNOSIS — J9611 Chronic respiratory failure with hypoxia: Secondary | ICD-10-CM | POA: Insufficient documentation

## 2017-02-21 DIAGNOSIS — Z79899 Other long term (current) drug therapy: Secondary | ICD-10-CM | POA: Diagnosis not present

## 2017-02-21 DIAGNOSIS — H269 Unspecified cataract: Secondary | ICD-10-CM | POA: Diagnosis not present

## 2017-02-21 DIAGNOSIS — G4733 Obstructive sleep apnea (adult) (pediatric): Secondary | ICD-10-CM | POA: Diagnosis not present

## 2017-02-21 NOTE — Progress Notes (Signed)
ADVANCED HF CLINIC CONSULT NOTE   Primary Cardiologist: [name]  HPI:  [name] is a 81 y.o. female with a history of HTN, DM2, CKD, diastolic HF, OSA, obesity and moderate pulmonary HTN. She denies any h/o known CAD or other heart disease. Non smoker.   She was referred by Tiffany. Radford Olsen for evaluation of [name].   Today she returns for HF follow up.   Feeling well. Walks with a cane for balance. Denies any DOE. Works FT at [name]. Able to do all activities without problem. Minimal ankle edema with standing. No dizziness. Wears CPAP at night. Going to Pulmonary Rehab. No longer needs O2 with walking. [name] walk done personally in clinic previously with desat down to 84% - increased to 92% with 2L O2)   ECHO 09/20/16 LVEF 09-47% Grade II diastolic dysfunction RV ok RVSP 59   PFTs 11/01/2016  FEV1 0.93 (67%) FVC  1.36 (75%)  DLCO 55%   Past Medical History:  Diagnosis Date  . Anemia    takes iron  . Arthritis    [name]  . Carotid artery stenosis    1-39% bilateral dopplers 08/2015  . Cataracts, bilateral   . Chronic kidney disease    stage III [name]  . Diabetes mellitus   . GERD (gastroesophageal reflux disease)   . Heart murmur    [name]  . HTN (hypertension)   . Hyperlipidemia   . Morbid obesity with BMI of 40.0-44.9, adult ([name])   . Obstructive sleep apnea (adult) (pediatric)    w CPAP Severe OSA AHI 52/hr CPAP at 10cm H2O  . Pulmonary HTN ([name])    mild with PASP 68mmHg echo 02/2014  . Right carotid bruit 08/19/2014  . Shortness of breath    resolved with CPAP  . Urination frequency   . Vitamin D deficiency disease   . Wears dentures    upper  . Wears glasses     Current Outpatient Prescriptions  Medication Sig Dispense Refill  . acetaminophen (TYLENOL) 500 MG tablet Take 500 mg by mouth every 8 (eight) hours as needed for mild pain, moderate pain, fever or headache.     Marland Kitchen amLODipine (NORVASC) 10 MG tablet Take 10 mg by mouth daily.  6    . aspirin EC 81 MG tablet Take 81 mg by mouth daily.    Marland Kitchen atorvastatin (LIPITOR) 40 MG tablet Take 40 mg by mouth at bedtime.    . Cholecalciferol (VITAMIN D) 2000 UNITS CAPS Take 2,000 Units by mouth daily.    Marland Kitchen docusate sodium (COLACE) 100 MG capsule Take 100 mg by mouth daily as needed for mild constipation.    Marland Kitchen esomeprazole (NEXIUM) 40 MG capsule Take 40 mg by mouth daily.     Marland Kitchen ezetimibe (ZETIA) 10 MG tablet Take 10 mg by mouth at bedtime.    . furosemide (LASIX) 40 MG tablet Take 40 mg by mouth daily.     . Multiple Vitamins-Minerals (MULTIVITAMINS THER. W/MINERALS) TABS Take 1 tablet by mouth daily.    . pioglitazone (ACTOS) 30 MG tablet Take 30 mg by mouth daily.    Marland Kitchen spironolactone (ALDACTONE) 25 MG tablet TAKE 0.5 TABLETS (12.5 MG TOTAL) BY MOUTH DAILY. 15 tablet 3   No current facility-administered medications for this encounter.     Allergies  Allergen Reactions  . Codeine Itching  . Ibuprofen Other (See Comments)    "gave her kidney trouble"      Social History  Social History  . Marital status: Widowed    Spouse name: N/A  . Number of children: N/A  . Years of education: N/A   Occupational History  . Not on file.   Social History Main Topics  . Smoking status: Never Smoker  . Smokeless tobacco: Never Used  . Alcohol use No  . Drug use: No  . Sexual activity: No   Other Topics Concern  . Not on file   Social History Narrative  . No narrative on file      Family History  Problem Relation Age of Onset  . Diabetes Mother   . Diabetes Father     Vitals:   02/21/17 1038  BP: 136/74  Pulse: 64  SpO2: 96%  Weight: 213 lb 4 oz (96.7 kg)      PHYSICAL EXAM: General:  Well appearing. No resp difficulty HEENT: normal Neck: supple. JVP 6. Carotids 2+ bilat; + bruits. No lymphadenopathy or thryomegaly appreciated. Cor: PMI nondisplaced. Regular rate & rhythm. 2/6 AS murmur s2 preserved Lungs: clear Abdomen: obese soft, nontender, nondistended.  No hepatosplenomegaly. No bruits or masses. Good bowel sounds. Extremities: no cyanosis, clubbing, rash, edema Neuro: alert & orientedx3, cranial nerves grossly intact. moves all 4 extremities w/o difficulty. Affect pleasant   ASSESSMENT & PLAN:  1. Pulm HTN    Pulmonary pressures up from previous and now in mild to moderate PH range. Suspect she has WHO Group II (diastolic HF) and III (OSA/OHS/hypoxia) PH.  PFTs reviewed today.  - Start supplemental O2 - Continue CPAP - Continue lasix 40 mg daily - Continue spiro 25 mg daily.  -BMET today.   2. Chronic hypoxic respiratory failure - multifactorial. PFTs reviewed personally. Mixed obstructive/restrictive picture - improved with pulmonary rehab. Now off O2 - refer to Tiffany. [place] O[place]  3. OSA  - continue CPAP  Tiffany Grinder, NP  10:46 AM   Patient seen and examined with Tiffany Grinder, NP. We discussed all aspects of the encounter. I agree with the assessment and plan as stated above.  Doing very well. NYHA II. Volume status looks good. Functional status  improved with pulmonary rehab. Now off O2.   PFTs reviewed personally and shows mixed obstructive/restrictive picture. Suspect PH is WHO Group II/III. No role for selective pulmonary vasodilators at this point.  Will repeat echo. If pressures increasing can consider RHC to further evaluate.   Continue CPAP. Continue attempts at weight loss.   Tiffany Bickers, MD  11:38 AM

## 2017-02-21 NOTE — Patient Instructions (Signed)
You have been referred to Dr. Lake Bells  Your physician has requested that you have an echocardiogram. Echocardiography is a painless test that uses sound waves to create images of your heart. It provides your doctor with information about the size and shape of your heart and how well your heart's chambers and valves are working. This procedure takes approximately one hour. There are no restrictions for this procedure.  Your physician recommends that you schedule a follow-up appointment in: 2-3 months with echocardiogram

## 2017-02-22 NOTE — Progress Notes (Signed)
Pulmonary Individual Treatment Plan  Patient Details  Name: [name] MRN: [id number] Date of Birth: 1936-02-08 Referring Provider:     Pulmonary Rehab Walk Test from 11/13/2016 in [facility]  Referring Provider  Dr. Haroldine Laws      Initial Encounter Date:    Pulmonary Rehab Walk Test from 11/13/2016 in [facility]  Date  11/13/16  Referring Provider  Dr. Haroldine Laws      Visit Diagnosis: Pulmonary hypertension ([place])  Patient's Home Medications on Admission:   Current Outpatient Prescriptions:  .  acetaminophen (TYLENOL) 500 MG tablet, Take 500 mg by mouth every 8 (eight) hours as needed for mild pain, moderate pain, fever or headache. , Disp: , Rfl:  .  amLODipine (NORVASC) 10 MG tablet, Take 10 mg by mouth daily., Disp: , Rfl: 6 .  aspirin EC 81 MG tablet, Take 81 mg by mouth daily., Disp: , Rfl:  .  atorvastatin (LIPITOR) 40 MG tablet, Take 40 mg by mouth at bedtime., Disp: , Rfl:  .  Cholecalciferol (VITAMIN D) 2000 UNITS CAPS, Take 2,000 Units by mouth daily., Disp: , Rfl:  .  docusate sodium (COLACE) 100 MG capsule, Take 100 mg by mouth daily as needed for mild constipation., Disp: , Rfl:  .  esomeprazole (NEXIUM) 40 MG capsule, Take 40 mg by mouth daily. , Disp: , Rfl:  .  ezetimibe (ZETIA) 10 MG tablet, Take 10 mg by mouth at bedtime., Disp: , Rfl:  .  furosemide (LASIX) 40 MG tablet, Take 40 mg by mouth daily. , Disp: , Rfl:  .  Multiple Vitamins-Minerals (MULTIVITAMINS THER. W/MINERALS) TABS, Take 1 tablet by mouth daily., Disp: , Rfl:  .  pioglitazone (ACTOS) 30 MG tablet, Take 30 mg by mouth daily., Disp: , Rfl:  .  spironolactone (ALDACTONE) 25 MG tablet, TAKE 0.5 TABLETS (12.5 MG TOTAL) BY MOUTH DAILY., Disp: 15 tablet, Rfl: 3  Past Medical History: Past Medical History:  Diagnosis Date  . Anemia    takes iron  . Arthritis    Dr Ronnie Derby  . Carotid artery stenosis    1-39% bilateral dopplers 08/2015   . Cataracts, bilateral   . Chronic kidney disease    stage III Dr Mercy Moore  . Diabetes mellitus   . GERD (gastroesophageal reflux disease)   . Heart murmur    Dr Irish Lack  . HTN (hypertension)   . Hyperlipidemia   . Morbid obesity with BMI of 40.0-44.9, adult ([place])   . Obstructive sleep apnea (adult) (pediatric)    w CPAP Severe OSA AHI 52/hr CPAP at 10cm H2O  . Pulmonary HTN ([place])    mild with PASP 56mHg echo 02/2014  . Right carotid bruit 08/19/2014  . Shortness of breath    resolved with CPAP  . Urination frequency   . Vitamin D deficiency disease   . Wears dentures    upper  . Wears glasses     Tobacco Use: History  Smoking Status  . Never Smoker  Smokeless Tobacco  . Never Used    Labs: Recent Review Flowsheet Data    Labs for ITP Cardiac and Pulmonary Rehab Latest Ref Rng & Units 05/02/2010 10/04/2015   Hemoglobin A1c 4.8 - 5.6 % 6.4 (NOTE)  According to the ADA Clinical Practice Recommendations for 2011, when HbA1c is used as a screening test:   >=6.5%   Diagnostic of Diabetes Mellitus           (if abnormal result is confirmed)  5.7-6.4%   Increased risk of developing Diabetes Mellitus  References:Diagnosis and Classification of Diabetes Mellitus,Diabetes [id number](WEXHB 1):S62-S69 and Standards of Medical Care in         Diabetes - 2011,Diabetes [id number] (Suppl 1):S11-S61.(H) 6.4(H)      Capillary Blood Glucose: Lab Results  Component Value Date   GLUCAP 106 (H) 11/22/2016   GLUCAP 115 (H) 11/22/2016   GLUCAP 117 (H) 11/20/2016   GLUCAP 115 (H) 11/20/2016   GLUCAP 150 (H) 10/05/2015       POCT Glucose    Row Name 11/22/16 1534 11/27/16 1621 12/11/16 1518 12/13/16 1542 12/25/16 1554     POCT Blood Glucose   Pre-Exercise 115 mg/dL 108 mg/dL 103 mg/dL  given lemonade pre exercise 121 mg/dL 98 mg/dL  ate a banana   Post-Exercise 104 mg/dL 114 mg/dL 117 mg/dL 119 mg/dL 104 mg/dL       Pulmonary Assessment Scores:     Pulmonary Assessment Scores    Row Name 12/04/16 1549         ADL UCSD   ADL Phase Entry     SOB Score total 8       CAT Score   CAT Score 4  Entry        Pulmonary Function Assessment:     Pulmonary Function Assessment - 11/09/16 1430      Breath   Bilateral Breath Sounds Clear   Shortness of Breath Yes;Limiting activity      Exercise Target Goals:    Exercise Program Goal: Individual exercise prescription set with THRR, safety & activity barriers. Participant demonstrates ability to understand and report RPE using BORG scale, to self-measure pulse accurately, and to acknowledge the importance of the exercise prescription.  Exercise Prescription Goal: Starting with aerobic activity 30 plus minutes a day, 3 days per week for initial exercise prescription. Provide home exercise prescription and guidelines that participant acknowledges understanding prior to discharge.  Activity Barriers & Risk Stratification:     Activity Barriers & Cardiac Risk Stratification - 11/09/16 1417      Activity Barriers & Cardiac Risk Stratification   Activity Barriers Deconditioning;Shortness of Breath;Arthritis;Left Knee Replacement;Right Knee Replacement;Assistive Device;Balance Concerns;Neck/Spine Problems      6 Minute Walk:     6 Minute Walk    Row Name 11/13/16 1651         6 Minute Walk   Phase Discharge     Distance 1163 feet     Walk Time 6 minutes     # of Rest Breaks 0     MPH 2.2     METS 2.53     RPE 15     Perceived Dyspnea  3     Symptoms Yes (comment)     Comments wheelchair     Resting HR 62 bpm     Resting BP 148/75     Max Ex. HR 105 bpm     Max Ex. BP 189/74     2 Minute Post BP 149/83       Interval HR   Baseline HR (retired) 62     1 Minute HR 64     2 Minute HR 62     3 Minute HR 63     4  Minute HR 82     5 Minute HR 104     6 Minute HR 105     2 Minute Post HR 96     Interval Heart Rate? Yes        Interval Oxygen   Interval Oxygen? Yes     Baseline Oxygen Saturation % 100 %     Resting Liters of Oxygen 2 L     1 Minute Oxygen Saturation % 97 %     1 Minute Liters of Oxygen 2 L     2 Minute Oxygen Saturation % 90 %     2 Minute Liters of Oxygen 2 L     3 Minute Oxygen Saturation % 98 %     3 Minute Liters of Oxygen 2 L     4 Minute Oxygen Saturation % 98 %     4 Minute Liters of Oxygen 2 L     5 Minute Oxygen Saturation % 100 %     5 Minute Liters of Oxygen 2 L     6 Minute Oxygen Saturation % 98 %     6 Minute Liters of Oxygen 2 L     2 Minute Post Oxygen Saturation % 100 %     2 Minute Post Liters of Oxygen 2 L        Oxygen Initial Assessment:     Oxygen Initial Assessment - 12/03/16 1142      Initial 6 min Walk   Oxygen Used Continuous;E-Tanks   Liters per minute 1  1-2 liters      Oxygen Re-Evaluation:     Oxygen Re-Evaluation    Row Name 12/04/16 0930 12/04/16 0931 12/06/16 0858 12/06/16 0900 01/01/17 1642     Program Oxygen Prescription   Program Oxygen Prescription Continuous  - Continuous  - Continuous   Liters per minute 2  - 2  - 2     Home Oxygen   Home Oxygen Device Home Concentrator;E-Tanks  - Home Concentrator;E-Tanks  - Home Concentrator;E-Tanks   Sleep Oxygen Prescription CPAP  -  - CPAP CPAP   Home Exercise Oxygen Prescription Continuous Continuous Continuous  - Continuous   Liters per minute  - 2 2  - 2   Home at Rest Exercise Oxygen Prescription  - Continuous Continuous  - Continuous   Liters per minute  - 2 2  - 2   Compliance with Home Oxygen Use  - Yes Yes  - No     Goals/Expected Outcomes   Short Term Goals  - To learn and exhibit compliance with exercise, home and travel O2 prescription;To learn and understand importance of monitoring SPO2 with pulse oximeter and demonstrate accurate use of the pulse oximeter.;To Learn and understand importance of maintaining oxygen saturations>88%;To learn and demonstrate proper purse lipped  breathing techniques or other breathing techniques.  - To learn and exhibit compliance with exercise, home and travel O2 prescription To learn and exhibit compliance with exercise, home and travel O2 prescription;To learn and understand importance of monitoring SPO2 with pulse oximeter and demonstrate accurate use of the pulse oximeter.;To Learn and understand importance of maintaining oxygen saturations>88%;To learn and demonstrate proper purse lipped breathing techniques or other breathing techniques.;To learn and demonstrate proper use of respiratory medications   Long  Term Goals  - Exhibits compliance with exercise, home and travel O2 prescription  - Exhibits compliance with exercise, home and travel O2 prescription Exhibits compliance with exercise, home and travel O2 prescription;Verbalizes importance  of monitoring SPO2 with pulse oximeter and return demonstration;Maintenance of O2 saturations>88%;Exhibits proper breathing techniques, such as purse lipped breathing or other method taught during program session   Comments  - Is compliant with oxygen at this time  - Is compliant with oxygen at this time pt remains compliant with her home oxygen prescription accept when at work. her portable tanks are too heavy for her to carry. she is working with [facility] to obtain a poc.   Goals/Expected Outcomes  - continued compliance  - continued compliance she will verbalize compliance with O2   Row Name 01/30/17 2139 02/22/17 1241           Program Oxygen Prescription   Program Oxygen Prescription None None        Home Oxygen   Home Oxygen Device Home Concentrator;E-Tanks None      Sleep Oxygen Prescription CPAP CPAP      Home Exercise Oxygen Prescription None None      Home at Rest Exercise Oxygen Prescription None None        Goals/Expected Outcomes   Comments  - patient has been given permission by cardiologist to discontinue her oxygen         Oxygen Discharge (Final Oxygen Re-Evaluation):      Oxygen Re-Evaluation - 02/22/17 1241      Program Oxygen Prescription   Program Oxygen Prescription None     Home Oxygen   Home Oxygen Device None   Sleep Oxygen Prescription CPAP   Home Exercise Oxygen Prescription None   Home at Rest Exercise Oxygen Prescription None     Goals/Expected Outcomes   Comments patient has been given permission by cardiologist to discontinue her oxygen      Initial Exercise Prescription:     Initial Exercise Prescription - 11/13/16 1600      Date of Initial Exercise RX and Referring Provider   Date 11/13/16   Referring Provider Dr. Gala Romney     Oxygen   Oxygen Continuous   Liters 2     NuStep   Level 2   Minutes 17   METs 1.5     Arm Ergometer   Level 2   Minutes 17   METs 1.5     Track   Laps 7   Minutes 17     Prescription Details   Frequency (times per week) 2   Duration Progress to 45 minutes of aerobic exercise without signs/symptoms of physical distress     Intensity   THRR 40-80% of Max Heartrate 57-114   Ratings of Perceived Exertion 11-13   Perceived Dyspnea 0-4     Progression   Progression Continue progressive overload as per policy without signs/symptoms or physical distress.     Resistance Training   Training Prescription Yes   Weight orange bands   Reps 10-15      Perform Capillary Blood Glucose checks as needed.  Exercise Prescription Changes:     Exercise Prescription Changes    Row Name 11/20/16 1500 11/22/16 1532 11/27/16 1600 12/11/16 1500 12/13/16 1500     Response to Exercise   Blood Pressure (Admit) 122/56 108/58 114/56 104/60 110/60   Blood Pressure (Exercise) 136/70 106/60 110/60 100/54 118/50   Blood Pressure (Exit) 120/56 102/60 100/60 104/54 108/70   Heart Rate (Admit) 62 bpm 57 bpm 59 bpm 60 bpm 58 bpm   Heart Rate (Exercise) 85 bpm 72 bpm 73 bpm 67 bpm 80 bpm   Heart Rate (Exit) 62 bpm 64 bpm  59 bpm 61 bpm 60 bpm   Oxygen Saturation (Admit) 97 % 97 % 99 % 100 % 100 %   Oxygen  Saturation (Exercise) 100 % 95 % 98 % 99 % 99 %   Oxygen Saturation (Exit) 99 % 94 % 100 % 100 % 100 %   Rating of Perceived Exertion (Exercise) '13 13 13 11 13   '$ Perceived Dyspnea (Exercise) '2 1 1 '$ 0 2   Duration Progress to 45 minutes of aerobic exercise without signs/symptoms of physical distress Progress to 45 minutes of aerobic exercise without signs/symptoms of physical distress Progress to 45 minutes of aerobic exercise without signs/symptoms of physical distress Progress to 45 minutes of aerobic exercise without signs/symptoms of physical distress Progress to 45 minutes of aerobic exercise without signs/symptoms of physical distress   Intensity -  40-80% HRR -  40-80% HRR THRR unchanged THRR unchanged THRR unchanged     Progression   Progression  -  - Continue to progress workloads to maintain intensity without signs/symptoms of physical distress. Continue to progress workloads to maintain intensity without signs/symptoms of physical distress. Continue to progress workloads to maintain intensity without signs/symptoms of physical distress.     Resistance Training   Training Prescription Yes Yes Yes Yes Yes   Weight orange bands orange bands orange bands orange bands orange bands   Reps 10-15 10-15 10-15 10-15 10-15   Time -  10 minutes 10 Minutes 10 Minutes 10 Minutes 10 Minutes     Interval Training   Interval Training No No No No No     Oxygen   Oxygen Continuous Continuous Continuous Continuous Continuous   Liters '2 2 2 2 2     '$ NuStep   Level 2  - '2 2 2   '$ Minutes 17  - '17 17 17   '$ METs 1.6  - 1.9 1.5 1.7     Arm Ergometer   Level '2 2 3 2  '$ reduced workload d/t shoulder pinched nerve 2   Minutes '17 17 17 17 17     '$ Track   Laps '9 10 11 8  '$ -   Minutes '17 17 17 17  '$ -   Row Name 12/18/16 1500 12/25/16 1500 12/27/16 1500 01/01/17 1500 01/03/17 1500     Response to Exercise   Blood Pressure (Admit) 104/60 1'00/70 98/60 96/62 '$ 100/60   Blood Pressure (Exercise) 130/64 96/54  94/64 120/60 96/64   Blood Pressure (Exit) 98/60 106/60 98/60 102/56 102/70   Heart Rate (Admit) 59 bpm 57 bpm 56 bpm 61 bpm 62 bpm   Heart Rate (Exercise) 64 bpm 68 bpm 72 bpm 76 bpm 85 bpm   Heart Rate (Exit) 61 bpm 60 bpm 58 bpm 58 bpm 63 bpm   Oxygen Saturation (Admit) 98 % 99 % 100 % 99 % 100 %   Oxygen Saturation (Exercise) 99 % 100 % 100 % 99 % 97 %   Oxygen Saturation (Exit) 98 % 99 % 100 % 97 % 98 %   Rating of Perceived Exertion (Exercise) '11 11 13 13 13   '$ Perceived Dyspnea (Exercise) 1 1 0 1 1   Duration Progress to 45 minutes of aerobic exercise without signs/symptoms of physical distress Progress to 45 minutes of aerobic exercise without signs/symptoms of physical distress Progress to 45 minutes of aerobic exercise without signs/symptoms of physical distress Progress to 45 minutes of aerobic exercise without signs/symptoms of physical distress Progress to 45 minutes of aerobic exercise without signs/symptoms of physical distress  Intensity THRR unchanged THRR unchanged THRR unchanged THRR unchanged THRR unchanged     Progression   Progression Continue to progress workloads to maintain intensity without signs/symptoms of physical distress. Continue to progress workloads to maintain intensity without signs/symptoms of physical distress. Continue to progress workloads to maintain intensity without signs/symptoms of physical distress. Continue to progress workloads to maintain intensity without signs/symptoms of physical distress. Continue to progress workloads to maintain intensity without signs/symptoms of physical distress.     Resistance Training   Training Prescription Yes Yes Yes Yes Yes   Weight orange bands orange bands orange bands orange bands orange bands   Reps 10-15 10-15 10-15 10-15 10-15   Time 10 Minutes 10 Minutes 10 Minutes 10 Minutes 10 Minutes     Interval Training   Interval Training No No No No No     Oxygen   Oxygen Continuous Continuous Continuous  Continuous  -   Liters '2 2 2 2  '$ -     NuStep   Level 2 2  - 4  -   Minutes 17 17  - 17  -   METs 1.7 1.7  - 1.8  -     Arm Ergometer   Level '2 3 3  '$ -  -   Minutes '17 17 17  '$ -  -     Track   Laps '8 8 9 22 14   '$ Minutes '17 17 17 '$ 34 29   Row Name 01/08/17 1500 01/10/17 1500 01/15/17 1500 01/17/17 1547 01/22/17 1500     Response to Exercise   Blood Pressure (Admit) 110/56 104/64 96/56 120/70 104/58   Blood Pressure (Exercise) 130/62 130/60 112/68 110/60 138/70   Blood Pressure (Exit) 110/82 104/48 122/62 100/66 112/58   Heart Rate (Admit) 64 bpm 65 bpm 66 bpm 66 bpm 63 bpm   Heart Rate (Exercise) 71 bpm 77 bpm 83 bpm 81 bpm 71 bpm   Heart Rate (Exit) 59 bpm 66 bpm 69 bpm 73 bpm 60 bpm   Oxygen Saturation (Admit) 99 % 100 % 100 % 99 % 98 %   Oxygen Saturation (Exercise) 99 % 97 % 98 % 96 % 97 %   Oxygen Saturation (Exit) 98 % 100 % 99 % 99 % 99 %   Rating of Perceived Exertion (Exercise) '13 11 11 12 13   '$ Perceived Dyspnea (Exercise) 1 0 '1 1 1   '$ Duration Progress to 45 minutes of aerobic exercise without signs/symptoms of physical distress Progress to 45 minutes of aerobic exercise without signs/symptoms of physical distress Progress to 45 minutes of aerobic exercise without signs/symptoms of physical distress Progress to 45 minutes of aerobic exercise without signs/symptoms of physical distress Progress to 45 minutes of aerobic exercise without signs/symptoms of physical distress   Intensity THRR unchanged THRR unchanged THRR unchanged THRR unchanged THRR unchanged     Progression   Progression Continue to progress workloads to maintain intensity without signs/symptoms of physical distress. Continue to progress workloads to maintain intensity without signs/symptoms of physical distress. Continue to progress workloads to maintain intensity without signs/symptoms of physical distress. Continue to progress workloads to maintain intensity without signs/symptoms of physical distress. Continue  to progress workloads to maintain intensity without signs/symptoms of physical distress.     Resistance Training   Training Prescription Yes Yes Yes Yes Yes   Weight orange bands orange bands orange bands orange bands orange bands   Reps 10-15 10-15 10-15 10-15 10-15   Time 10 Minutes 10 Minutes  10 Minutes 10 Minutes 10 Minutes     Interval Training   Interval Training No No No No No     NuStep   Level '4 5 5 5 6   '$ Minutes '17 17 17 17 17   '$ METs 1.'4 2 2 '$ 1.8 2.2     Track   Laps '18 8 11 19 15   '$ Minutes 34 17 34 34 34     Home Exercise Plan   Plans to continue exercise at Home (comment)  -  -  -  -   Frequency Add 3 additional days to program exercise sessions.  -  -  -  -   Row Name 01/24/17 1600 02/05/17 1500 02/07/17 1531 02/12/17 1553 02/19/17 1612     Response to Exercise   Blood Pressure (Admit) 130/72 132/78 118/62 100/66 110/66   Blood Pressure (Exercise) 104/62 140/78 114/64 130/70 128/74   Blood Pressure (Exit) 98/62 110/60 112/50 104/60 106/62   Heart Rate (Admit) 64 bpm 58 bpm 61 bpm 54 bpm 60 bpm   Heart Rate (Exercise) 78 bpm 83 bpm 70 bpm 98 bpm 80 bpm   Heart Rate (Exit) 59 bpm 72 bpm 62 bpm 58 bpm 66 bpm   Oxygen Saturation (Admit) 100 % 99 % 97 % 100 % 100 %   Oxygen Saturation (Exercise) 97 % 97 % 99 % 98 % 95 %   Oxygen Saturation (Exit) 98 % 94 % 98 % 99 % 99 %   Rating of Perceived Exertion (Exercise) '12 12 13 12 12   '$ Perceived Dyspnea (Exercise) '1 1 1 '$ 0 0   Duration Progress to 45 minutes of aerobic exercise without signs/symptoms of physical distress Progress to 45 minutes of aerobic exercise without signs/symptoms of physical distress Progress to 45 minutes of aerobic exercise without signs/symptoms of physical distress Progress to 45 minutes of aerobic exercise without signs/symptoms of physical distress Progress to 45 minutes of aerobic exercise without signs/symptoms of physical distress   Intensity THRR unchanged THRR unchanged THRR unchanged THRR  unchanged THRR unchanged     Progression   Progression Continue to progress workloads to maintain intensity without signs/symptoms of physical distress. Continue to progress workloads to maintain intensity without signs/symptoms of physical distress. Continue to progress workloads to maintain intensity without signs/symptoms of physical distress. Continue to progress workloads to maintain intensity without signs/symptoms of physical distress. Continue to progress workloads to maintain intensity without signs/symptoms of physical distress.     Resistance Training   Training Prescription Yes Yes Yes Yes Yes   Weight orange bands orange bands orange bands orange bands orange bands   Reps 10-15 10-15 10-15 10-15 10-15   Time 10 Minutes 10 Minutes 10 Minutes 10 Minutes 10 Minutes     Interval Training   Interval Training No No No No No     NuStep   Level '6 6 6 6 6   '$ Minutes '17 17 17 17 17   '$ METs 2.3 2.2 2.4 2.4 2.3     Track   Laps  - '13 10 11 21   '$ Minutes 17 34 34 17 34      Exercise Comments:     Exercise Comments    Row Name 01/08/17 1701           Exercise Comments Home exercise completed          Exercise Goals and Review:     Exercise Goals    [place] Name 11/09/16 1418  Exercise Goals   Increase Physical Activity Yes       Intervention Provide advice, education, support and counseling about physical activity/exercise needs.;Develop an individualized exercise prescription for aerobic and resistive training based on initial evaluation findings, risk stratification, comorbidities and participant's personal goals.       Expected Outcomes Achievement of increased cardiorespiratory fitness and enhanced flexibility, muscular endurance and strength shown through measurements of functional capacity and personal statement of participant.       Increase Strength and Stamina Yes       Intervention Provide advice, education, support and counseling about physical  activity/exercise needs.;Develop an individualized exercise prescription for aerobic and resistive training based on initial evaluation findings, risk stratification, comorbidities and participant's personal goals.       Expected Outcomes Achievement of increased cardiorespiratory fitness and enhanced flexibility, muscular endurance and strength shown through measurements of functional capacity and personal statement of participant.          Exercise Goals Re-Evaluation :     Exercise Goals Re-Evaluation    Row Name 12/03/16 1143 12/28/16 1046 01/29/17 0952 02/18/17 1557       Exercise Goal Re-Evaluation   Exercise Goals Review Increase Physical Activity;Increase Strenth and Stamina Increase Strenth and Stamina;Increase Physical Activity Increase Strength and Stamina;Increase Physical Activity;Able to understand and use Dyspnea scale;Able to understand and use rate of perceived exertion (RPE) scale;Knowledge and understanding of Target Heart Rate Range (THRR);Understanding of Exercise Prescription Increase Strength and Stamina;Increase Physical Activity;Able to understand and use rate of perceived exertion (RPE) scale;Knowledge and understanding of Target Heart Rate Range (THRR);Understanding of Exercise Prescription;Able to understand and use Dyspnea scale    Comments Patient has only attended three exercise sessions. Will cont. to monitor and progress as able.  Patient is making slow progress. She has difficulty due to orthopedic issues. We have taken her off the arm ergometer and placed her on the walking track for two stations or 30 minutes. This will help her in reaching her weight loss goal. Will cont. to monitor and progress patient as able.  Patient is progressing well. Limited by orthopedic issues. D/c oxygen for exercise. Will cont. to monitor and progress when able.  Patient is progressing well. Limited by orthopedic issues. D/c oxygen for exercise. Averages 2.4 METS. Will cont. to monitor  and progress when able. Will be graduating soon.     Expected Outcomes Through exercising at rehab and at home, patient will increase physical strength and stamina and find ADL's easier to perform.  Through exercising at rehab and at home, patient will increase physical strength and stamina and find ADL's easier to perform.  Through exercising at rehab and at home, patient will increase physical strength and stamina and find ADL's easier to perform.  Through exercising at rehab and at home, patient will increase physical strength and stamina and find ADL's easier to perform.        Discharge Exercise Prescription (Final Exercise Prescription Changes):     Exercise Prescription Changes - 02/19/17 1612      Response to Exercise   Blood Pressure (Admit) 110/66   Blood Pressure (Exercise) 128/74   Blood Pressure (Exit) 106/62   Heart Rate (Admit) 60 bpm   Heart Rate (Exercise) 80 bpm   Heart Rate (Exit) 66 bpm   Oxygen Saturation (Admit) 100 %   Oxygen Saturation (Exercise) 95 %   Oxygen Saturation (Exit) 99 %   Rating of Perceived Exertion (Exercise) 12   Perceived Dyspnea (Exercise) 0  Duration Progress to 45 minutes of aerobic exercise without signs/symptoms of physical distress   Intensity THRR unchanged     Progression   Progression Continue to progress workloads to maintain intensity without signs/symptoms of physical distress.     Resistance Training   Training Prescription Yes   Weight orange bands   Reps 10-15   Time 10 Minutes     Interval Training   Interval Training No     NuStep   Level 6   Minutes 17   METs 2.3     Track   Laps 21   Minutes 34      Nutrition:  Target Goals: Understanding of nutrition guidelines, daily intake of sodium '1500mg'$ , cholesterol '200mg'$ , calories 30% from fat and 7% or less from saturated fats, daily to have 5 or more servings of fruits and vegetables.  Biometrics:     Pre Biometrics - 11/09/16 1418      Pre Biometrics    Grip Strength 18 kg       Nutrition Therapy Plan and Nutrition Goals:     Nutrition Therapy & Goals - 12/18/16 1516      Nutrition Therapy   Diet Carb Modified, Therapeutic Lifestyle Changes     Personal Nutrition Goals   Nutrition Goal Identify food quantities necessary to achieve wt loss of  -2# per week to a goal wt loss of 2.7-10.9 kg (6-24 lb) at graduation from pulmonary rehab.     Intervention Plan   Intervention Prescribe, educate and counsel regarding individualized specific dietary modifications aiming towards targeted core components such as weight, hypertension, lipid management, diabetes, heart failure and other comorbidities.   Expected Outcomes Short Term Goal: Understand basic principles of dietary content, such as calories, fat, sodium, cholesterol and nutrients.;Long Term Goal: Adherence to prescribed nutrition plan.      Nutrition Discharge: Rate Your Plate Scores:     Nutrition Assessments - 12/17/16 0946      Rate Your Plate Scores   Pre Score 58      Nutrition Goals Re-Evaluation:   Nutrition Goals Discharge (Final Nutrition Goals Re-Evaluation):   Psychosocial: Target Goals: Acknowledge presence or absence of significant depression and/or stress, maximize coping skills, provide positive support system. Participant is able to verbalize types and ability to use techniques and skills needed for reducing stress and depression.  Initial Review & Psychosocial Screening:     Initial Psych Review & Screening - 11/09/16 1436      Initial Review   Current issues with None Identified     Family Dynamics   Good Support System? Yes     Barriers   Psychosocial barriers to participate in program There are no identifiable barriers or psychosocial needs.      Quality of Life Scores:   PHQ-9: Recent Review Flowsheet Data    Depression screen [facility] 2/9 11/09/2016   Decreased Interest 0   Down, Depressed, Hopeless 0   PHQ - 2 Score 0      Interpretation of Total Score  Total Score Depression Severity:  1-4 = Minimal depression, 5-9 = Mild depression, 10-14 = Moderate depression, 15-19 = Moderately severe depression, 20-27 = Severe depression   Psychosocial Evaluation and Intervention:     Psychosocial Evaluation - 11/09/16 1438      Psychosocial Evaluation & Interventions   Interventions Encouraged to exercise with the program and follow exercise prescription   Continue Psychosocial Services  No Follow up required      Psychosocial Re-Evaluation:  Psychosocial Re-Evaluation    Row Name 11/09/16 1439 12/04/16 0936 01/01/17 1652 01/30/17 2141 02/22/17 1244     Psychosocial Re-Evaluation   Current issues with None Identified None Identified None Identified None Identified None Identified   Expected Outcomes  -  -  - patient will remain free from psychosocial barriers to participation in pulmonary rehab patient will remain free from psychosocial barriers to participation in pulmonary rehab   Interventions Encouraged to attend Pulmonary Rehabilitation for the exercise Encouraged to attend Pulmonary Rehabilitation for the exercise Encouraged to attend Pulmonary Rehabilitation for the exercise Encouraged to attend Pulmonary Rehabilitation for the exercise Encouraged to attend Pulmonary Rehabilitation for the exercise   Continue Psychosocial Services  No Follow up required No Follow up required No Follow up required No Follow up required No Follow up required      Psychosocial Discharge (Final Psychosocial Re-Evaluation):     Psychosocial Re-Evaluation - 02/22/17 1244      Psychosocial Re-Evaluation   Current issues with None Identified   Expected Outcomes patient will remain free from psychosocial barriers to participation in pulmonary rehab   Interventions Encouraged to attend Pulmonary Rehabilitation for the exercise   Continue Psychosocial Services  No Follow up required      Education: Education Goals:  Education classes will be provided on a weekly basis, covering required topics. Participant will state understanding/return demonstration of topics presented.  Learning Barriers/Preferences:     Learning Barriers/Preferences - 11/09/16 1430      Learning Barriers/Preferences   Learning Barriers None   Learning Preferences Group Instruction;Individual Instruction;Written Material;Skilled Demonstration      Education Topics: Risk Factor Reduction:  -Group instruction that is supported by a PowerPoint presentation. Instructor discusses the definition of a risk factor, different risk factors for pulmonary disease, and how the heart and lungs work together.     PULMONARY REHAB OTHER RESPIRATORY from 02/12/2017 in [place]  Date  12/27/16  Educator  EP  Instruction Review Code  2- meets goals/outcomes      Nutrition for Pulmonary Patient:  -Group instruction provided by PowerPoint slides, verbal discussion, and written materials to support subject matter. The instructor gives an explanation and review of healthy diet recommendations, which includes a discussion on weight management, recommendations for fruit and vegetable consumption, as well as protein, fluid, caffeine, fiber, sodium, sugar, and alcohol. Tips for eating when patients are short of breath are discussed.   PULMONARY REHAB OTHER RESPIRATORY from 02/12/2017 in [place]  Date  01/24/17  Educator  RD  Instruction Review Code  2- meets goals/outcomes      Pursed Lip Breathing:  -Group instruction that is supported by demonstration and informational handouts. Instructor discusses the benefits of pursed lip and diaphragmatic breathing and detailed demonstration on how to preform both.     PULMONARY REHAB OTHER RESPIRATORY from 02/12/2017 in [place]  Date  02/07/17  Educator  RT  Instruction Review Code  2- meets goals/outcomes       Oxygen Safety:  -Group instruction provided by PowerPoint, verbal discussion, and written material to support subject matter. There is an overview of "What is Oxygen" and "Why do we need it".  Instructor also reviews how to create a safe environment for oxygen use, the importance of using oxygen as prescribed, and the risks of noncompliance. There is a brief discussion on traveling with oxygen and resources the patient may utilize.   PULMONARY  REHAB OTHER RESPIRATORY from 02/12/2017 in [place]  Date  01/10/17  Educator  Truddie Crumble  Instruction Review Code  2- meets goals/outcomes      Oxygen Equipment:  -Group instruction provided by [employer] Staff utilizing handouts, written materials, and equipment demonstrations.   Signs and Symptoms:  -Group instruction provided by written material and verbal discussion to support subject matter. Warning signs and symptoms of infection, stroke, and heart attack are reviewed and when to call the physician/911 reinforced. Tips for preventing the spread of infection discussed.   PULMONARY REHAB OTHER RESPIRATORY from 02/12/2017 in [place]  Date  12/13/16  Educator  rn  Instruction Review Code  2- meets goals/outcomes      Advanced Directives:  -Group instruction provided by verbal instruction and written material to support subject matter. Instructor reviews Advanced Directive laws and proper instruction for filling out document.   Pulmonary Video:  -Group video education that reviews the importance of medication and oxygen compliance, exercise, good nutrition, pulmonary hygiene, and pursed lip and diaphragmatic breathing for the pulmonary patient.   Exercise for the Pulmonary Patient:  -Group instruction that is supported by a PowerPoint presentation. Instructor discusses benefits of exercise, core components of exercise, frequency, duration, and intensity of an exercise  routine, importance of utilizing pulse oximetry during exercise, safety while exercising, and options of places to exercise outside of rehab.     PULMONARY REHAB OTHER RESPIRATORY from 02/12/2017 in [place]  Date  02/12/17  Educator  ep  Instruction Review Code  2- meets goals/outcomes      Pulmonary Medications:  -Verbally interactive group education provided by instructor with focus on inhaled medications and proper administration.   Anatomy and Physiology of the Respiratory System and Intimacy:  -Group instruction provided by PowerPoint, verbal discussion, and written material to support subject matter. Instructor reviews respiratory cycle and anatomical components of the respiratory system and their functions. Instructor also reviews differences in obstructive and restrictive respiratory diseases with examples of each. Intimacy, Sex, and Sexuality differences are reviewed with a discussion on how relationships can change when diagnosed with pulmonary disease. Common sexual concerns are reviewed.   PULMONARY REHAB OTHER RESPIRATORY from 02/12/2017 in [place]  Date  12/20/16  Educator  Rn  Instruction Review Code  2- meets goals/outcomes      MD DAY -A group question and answer session with a medical doctor that allows participants to ask questions that relate to their pulmonary disease state.   OTHER EDUCATION -Group or individual verbal, written, or video instructions that support the educational goals of the pulmonary rehab program.   Knowledge Questionnaire Score:     Knowledge Questionnaire Score - 12/04/16 1548      Knowledge Questionnaire Score   Pre Score 11/13      Core Components/Risk Factors/Patient Goals at Admission:     Personal Goals and Risk Factors at Admission - 11/09/16 1435      Core Components/Risk Factors/Patient Goals on Admission    Weight Management Yes;Obesity   Intervention  Obesity: Provide education and appropriate resources to help participant work on and attain dietary goals.;Weight Management/Obesity: Establish reasonable short term and long term weight goals.;Weight Management: Provide education and appropriate resources to help participant work on and attain dietary goals.;Weight Management: Develop a combined nutrition and exercise program designed to reach desired caloric intake, while maintaining appropriate intake of nutrient and fiber,  sodium and fats, and appropriate energy expenditure required for the weight goal.   Admit Weight 214 lb 11.5 oz (97.4 kg)   Expected Outcomes Short Term: Continue to assess and modify interventions until short term weight is achieved;Weight Loss: Understanding of general recommendations for a balanced deficit meal plan, which promotes 1-2 lb weight loss per week and includes a negative energy balance of [phone] kcal/d;Understanding recommendations for meals to include 15-35% energy as protein, 25-35% energy from fat, 35-60% energy from carbohydrates, less than '200mg'$  of dietary cholesterol, 20-35 gm of total fiber daily;Understanding of distribution of calorie intake throughout the day with the consumption of 4-5 meals/snacks   Improve shortness of breath with ADL's Yes   Intervention Provide education, individualized exercise plan and daily activity instruction to help decrease symptoms of SOB with activities of daily living.   Expected Outcomes Short Term: Achieves a reduction of symptoms when performing activities of daily living.   Develop more efficient breathing techniques such as purse lipped breathing and diaphragmatic breathing; and practicing self-pacing with activity Yes   Intervention Provide education, demonstration and support about specific breathing techniuqes utilized for more efficient breathing. Include techniques such as pursed lipped breathing, diaphragmatic breathing and self-pacing activity.   Expected Outcomes  Short Term: Participant will be able to demonstrate and use breathing techniques as needed throughout daily activities.      Core Components/Risk Factors/Patient Goals Review:      Goals and Risk Factor Review    Row Name 12/04/16 0934 12/06/16 0900 01/01/17 1648 01/30/17 2139 02/22/17 1242     Core Components/Risk Factors/Patient Goals Review   Personal Goals Review Weight Management/Obesity;Improve shortness of breath with ADL's;Develop more efficient breathing techniques such as purse lipped breathing and diaphragmatic breathing and practicing self-pacing with activity. Weight Management/Obesity;Improve shortness of breath with ADL's;Develop more efficient breathing techniques such as purse lipped breathing and diaphragmatic breathing and practicing self-pacing with activity. Weight Management/Obesity;Improve shortness of breath with ADL's;Develop more efficient breathing techniques such as purse lipped breathing and diaphragmatic breathing and practicing self-pacing with activity. Weight Management/Obesity;Improve shortness of breath with ADL's;Develop more efficient breathing techniques such as purse lipped breathing and diaphragmatic breathing and practicing self-pacing with activity. Weight Management/Obesity;Improve shortness of breath with ADL's;Develop more efficient breathing techniques such as purse lipped breathing and diaphragmatic breathing and practicing self-pacing with activity.   Review No weight loss, has exercised in 3 exercise sessions, too early to see progress  - patient continues to make slow progress towards pulmonary rehab goals. she has not began to loose weight however an adjustment in her exercise prescription will hopefully address this issue. she is utilizing pursed lip breathing when prompted and continues to self pace. 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 patient has not  been able to progress toward weightloss goals however she sees a significant improvement in her shortness of breath. she has been able to maintain oxygen saturations >92 with exertion on room air. dr bensimhon has given Floriene permission to discontinue her oxygen patient continue to work hard in pulmonary rehab. she has not began to loose weight but she is extremely happy she does not have to wear oxygen any more. she understands the importance of continuing to check her oxygen saturations to make sure she is maintaining greater than 92   Expected Outcomes expect progression towards goals in the next 30 days  - expect progression  towards goals in the next 30 days expect progression towards goals in the next 30 days expect progression towards goals in the next 30 days      Core Components/Risk Factors/Patient Goals at Discharge (Final Review):      Goals and Risk Factor Review - 02/22/17 1242      Core Components/Risk Factors/Patient Goals Review   Personal Goals Review Weight Management/Obesity;Improve shortness of breath with ADL's;Develop more efficient breathing techniques such as purse lipped breathing and diaphragmatic breathing and practicing self-pacing with activity.   Review patient continue to work hard in pulmonary rehab. she has not began to loose weight but she is extremely happy she does not have to wear oxygen any more. she understands the importance of continuing to check her oxygen saturations to make sure she is maintaining greater than 92   Expected Outcomes expect progression towards goals in the next 30 days      ITP Comments:   Comments: ITP REVIEW Pt is making expected progress toward pulmonary rehab goals after completing 22 sessions. Recommend continued exercise, life style modification, education, and utilization of breathing techniques to increase stamina and strength and decrease shortness of breath with exertion.

## 2017-02-26 ENCOUNTER — Encounter (HOSPITAL_COMMUNITY)
Admission: RE | Admit: 2017-02-26 | Discharge: 2017-02-26 | Disposition: A | Discharge status: Home / Self Care | Payer: 59 | Source: Ambulatory Visit | Attending: Internal Medicine | Admitting: Internal Medicine

## 2017-02-26 VITALS — Wt 210.1 lb

## 2017-02-26 DIAGNOSIS — I272 Pulmonary hypertension, unspecified: Secondary | ICD-10-CM | POA: Diagnosis not present

## 2017-02-26 NOTE — Progress Notes (Signed)
Daily Session Note  Patient Details  Name: [name] MRN: [id number] Date of Birth: 05/28/1936 Referring Provider:     Pulmonary Rehab Walk Test from 11/13/2016 in [place]  Referring Provider  Dr. Haroldine Laws      Encounter Date: 02/26/2017  Check In:     Session Check In - 02/26/17 1330      Check-In   Location [facility]   Staff Present Su Hilt, MS, ACSM RCEP, Exercise Physiologist;Lisa Ysidro Evert, RN;Nayan Proch Rollene Rotunda, RN, BSN   Supervising physician immediately available to respond to emergencies [facility] immediately available   Physician(s) Dr. Wynelle Cleveland   Medication changes reported     No   Fall or balance concerns reported    No   Tobacco Cessation No Change   Warm-up and Cool-down Performed as group-led instruction   Resistance Training Performed Yes   VAD Patient? No     Pain Assessment   Currently in Pain? No/denies   Multiple Pain Sites No      Capillary Blood Glucose: No results found for this or any previous visit (from the past 24 hour(s)).      Exercise Prescription Changes - 02/26/17 1529      Response to Exercise   Blood Pressure (Admit) 108/60   Blood Pressure (Exercise) 116/64   Blood Pressure (Exit) 108/72   Heart Rate (Admit) 61 bpm   Heart Rate (Exercise) 71 bpm   Heart Rate (Exit) 61 bpm   Oxygen Saturation (Admit) 99 %   Oxygen Saturation (Exercise) 98 %   Oxygen Saturation (Exit) 98 %   Rating of Perceived Exertion (Exercise) 11   Perceived Dyspnea (Exercise) 0   Duration Progress to 45 minutes of aerobic exercise without signs/symptoms of physical distress   Intensity THRR unchanged     Progression   Progression Continue to progress workloads to maintain intensity without signs/symptoms of physical distress.     Resistance Training   Training Prescription Yes   Weight orange bands   Reps 10-15   Time 10 Minutes     Interval Training   Interval Training No     NuStep   Level 6   Minutes 17   METs 2.2     Track   Laps 21   Minutes 34      History  Smoking Status  . Never Smoker  Smokeless Tobacco  . Never Used    Goals Met:  Independence with exercise equipment Improved SOB with ADL's Using PLB without cueing & demonstrates good technique Exercise tolerated well No report of cardiac concerns or symptoms Strength training completed today  Goals Unmet:  Not Applicable  Comments: Service time is from 1330 to 1500   Dr. Rush Farmer is Medical Director for Pulmonary Rehab at [facility].

## 2017-02-27 ENCOUNTER — Other Ambulatory Visit (HOSPITAL_COMMUNITY): Payer: Self-pay | Admitting: *Deleted

## 2017-02-27 DIAGNOSIS — I272 Pulmonary hypertension, unspecified: Secondary | ICD-10-CM

## 2017-02-28 ENCOUNTER — Encounter (HOSPITAL_COMMUNITY)
Admission: RE | Admit: 2017-02-28 | Discharge: 2017-02-28 | Disposition: A | Discharge status: Home / Self Care | Payer: 59 | Source: Ambulatory Visit | Attending: Internal Medicine | Admitting: Internal Medicine

## 2017-02-28 DIAGNOSIS — I272 Pulmonary hypertension, unspecified: Secondary | ICD-10-CM

## 2017-03-05 ENCOUNTER — Encounter (HOSPITAL_COMMUNITY): Payer: 59

## 2017-03-07 ENCOUNTER — Encounter (HOSPITAL_COMMUNITY): Payer: 59

## 2017-03-07 IMAGING — MR MR CERVICAL SPINE W/O CM
4 of 5 series · 26 of 48 positions shown · non-contrast
Comparison: None.

CLINICAL DATA: Acute onset neck pain and stiffness for 3 weeks,
improved for 1 week. Assess for potential herniated nucleus pulposus
or spinal stenosis.

EXAM:
MRI CERVICAL SPINE WITHOUT CONTRAST
TECHNIQUE: Multiplanar, multisequence MR imaging of the cervical spine was
performed. No intravenous contrast was administered.

[Series 3: T2 · sagittal · 3.5mm · 0.35mm/px · 8 of 12 slices shown (1 of 2)]
[im 1/12]
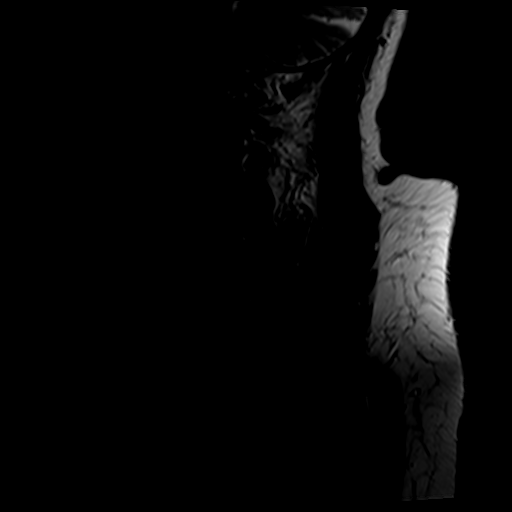
[im 2/12]
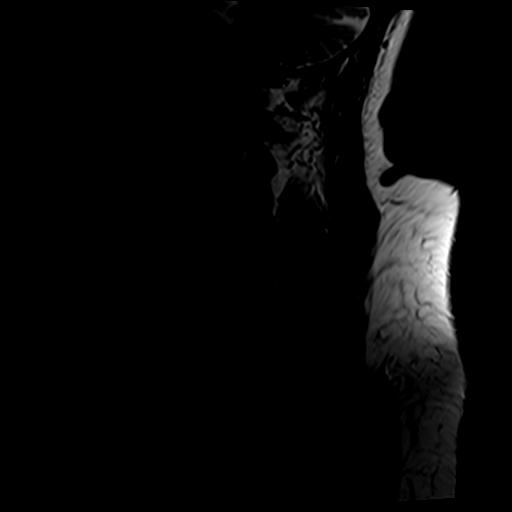
[im 4/12]
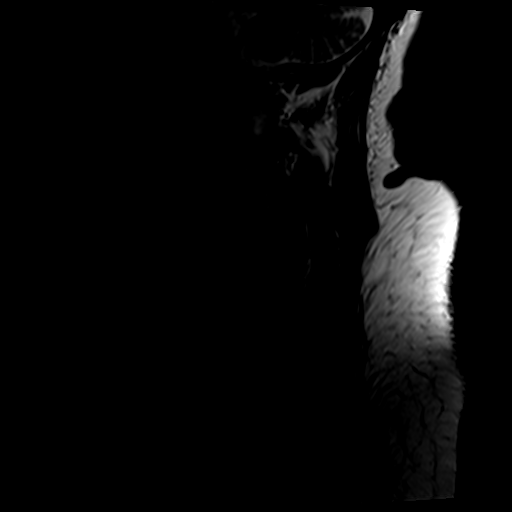
[im 5/12]
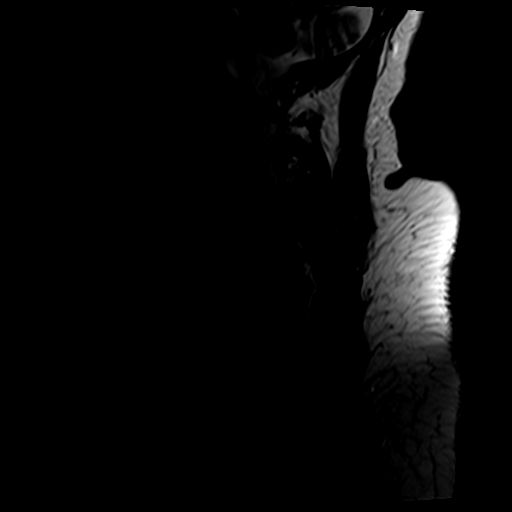
[im 7/12]
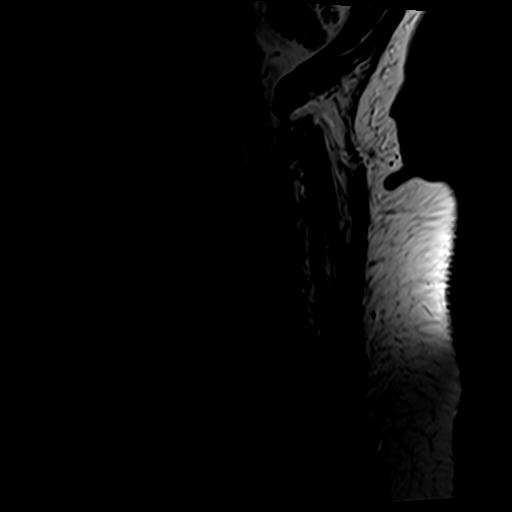
[im 8/12]
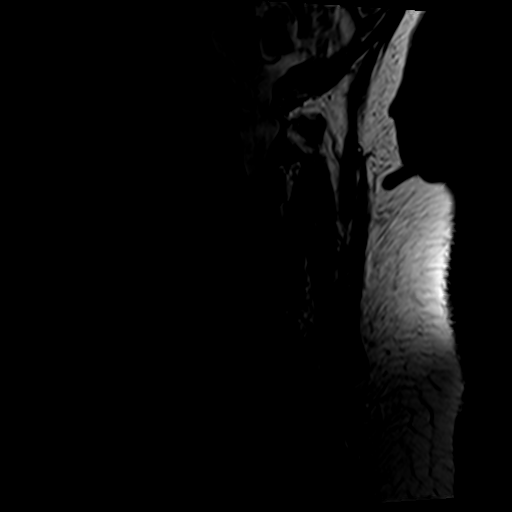
[im 10/12]
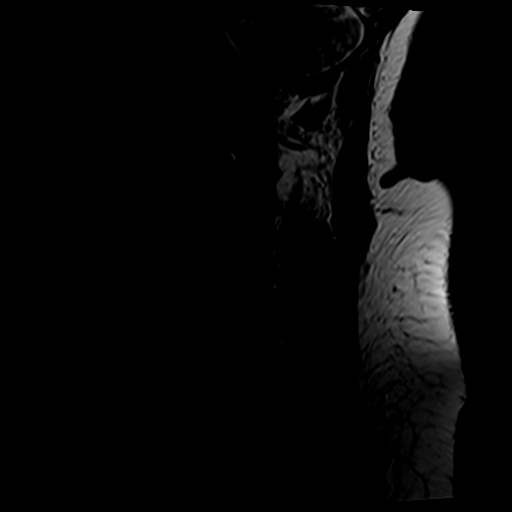
[im 12/12]
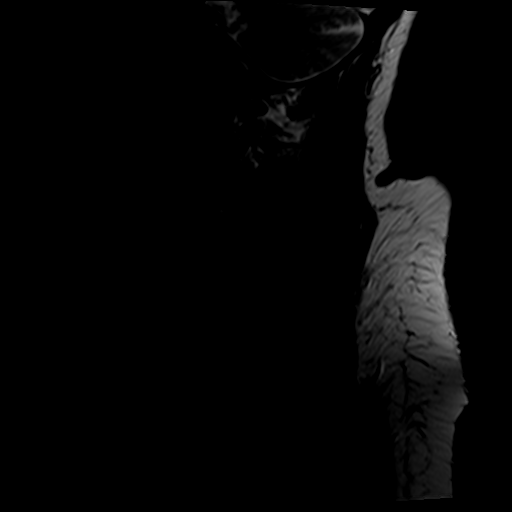

[Series 4: T1 · sagittal · 3.5mm · 0.35mm/px · 6 of 12 slices shown]
[im 1/12]
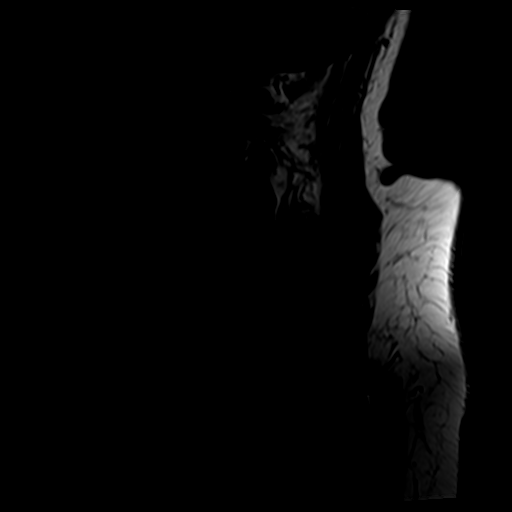
[im 2/12]
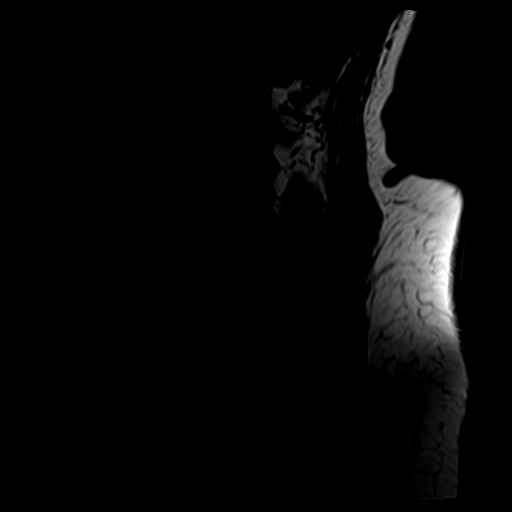
[im 4/12]
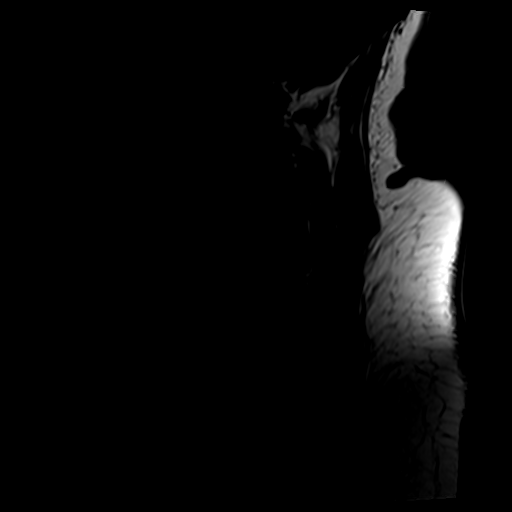
[im 6/12]
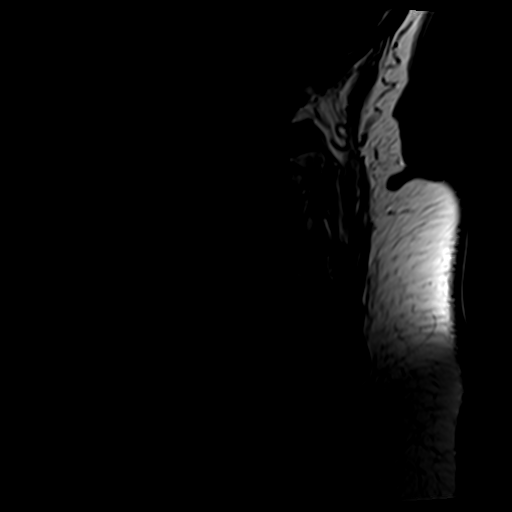
[im 8/12]
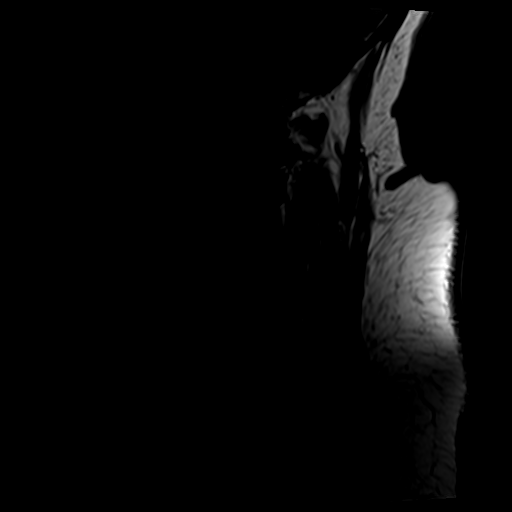
[im 10/12]
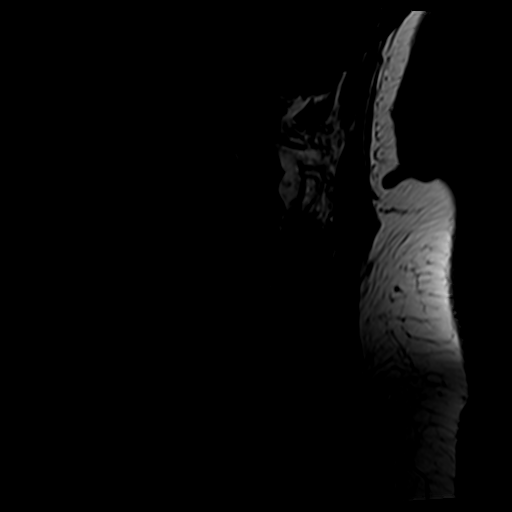

[Series 5: STIR · sagittal · 3.5mm · 0.47mm/px · 3 of 12 slices shown]
[im 2/12]
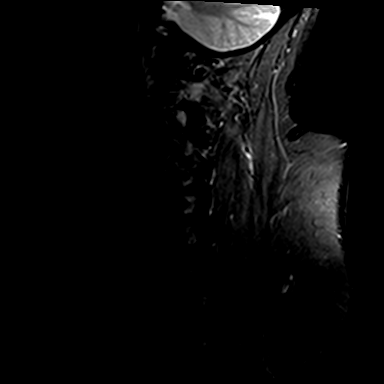
[im 6/12]
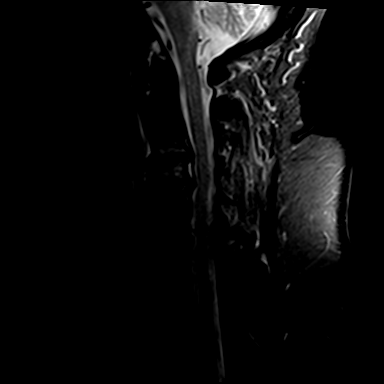
[im 10/12]
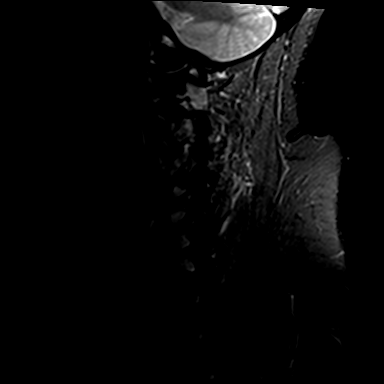

[Series 7: T2 · axial · 3.0mm · 0.74mm/px · z∈[-80,-2]mm · 9 of 22 slices shown (2 of 2)]
[im 1/22]
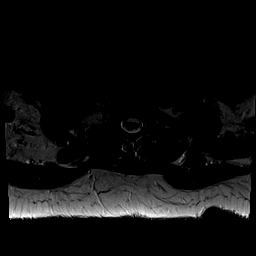
[im 4/22]
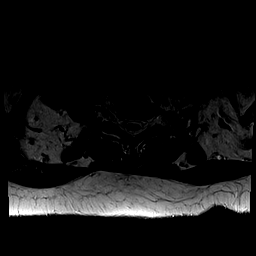
[im 8/22]
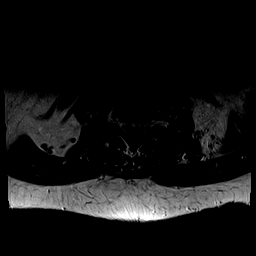
[im 9/22]
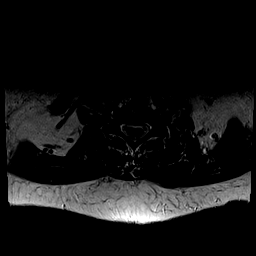
[im 11/22]
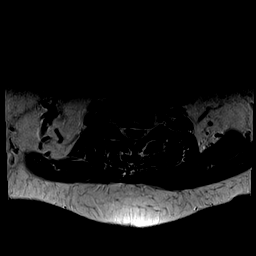
[im 13/22]
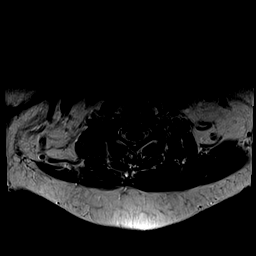
[im 15/22]
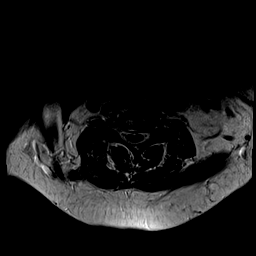
[im 18/22]
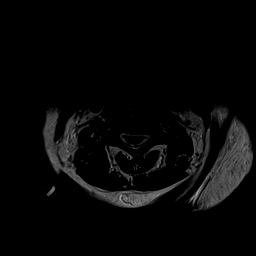
[im 22/22]
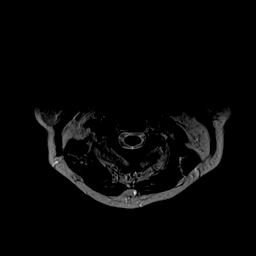

[26 of 48 positions shown; findings below may reference images not displayed]

FINDINGS: ALIGNMENT: Straightened cervical lordosis.  No malalignment.

VERTEBRAE/DISCS: Vertebral bodies are intact. Moderate to severe
C3-4 thru C5-6 disc height loss with decreased T2 signal within all
disc compatible with desiccation. Moderate to severe chronic
discogenic endplate changes C3-4 through C5-6, acute component C3-4
and C4-5. No suspicious bone marrow signal. Small T1-2 and T2-3 disc
protrusions.

CORD:Cervical spinal cord is normal morphology and signal
characteristics from the cervicomedullary junction to level of T1-2,
the most caudal well visualized level.

POSTERIOR FOSSA, VERTEBRAL ARTERIES, PARASPINAL TISSUES: No MR
findings of ligamentous injury. Vertebral artery flow voids present.
Included posterior fossa and paraspinal soft tissues are normal.

DISC LEVELS:

C2-3: No disc bulge, canal stenosis nor neural foraminal narrowing.
Mild facet arthropathy.

C3-4: 2 mm broad-based disc bulge, uncovertebral hypertrophy and
mild facet arthropathy. Mild canal stenosis. Mild bilateral neural
foraminal narrowing.

C4-5: 4 mm broad-based disc bulge, uncovertebral hypertrophy and
mild facet arthropathy. Mild canal stenosis. Moderate LEFT neural
foraminal narrowing.

C5-6: 2 mm broad-based disc bulge, uncovertebral hypertrophy and
mild facet arthropathy. No canal stenosis. Moderate bilateral neural
foraminal narrowing.

C6-7: 2 mm broad-based disc bulge, uncovertebral hypertrophy mild
facet arthropathy. No canal stenosis or neural foraminal narrowing.

C7-T1: No disc bulge, canal stenosis nor neural foraminal narrowing.
IMPRESSION: Degenerative cervical spine resulting in mild canal stenosis C3-4
and C4-5.

Neural foraminal narrowing C3-4 thru C5-6: Moderate at C4-5 and
C5-6.

## 2017-03-18 ENCOUNTER — Encounter (HOSPITAL_COMMUNITY): Payer: Self-pay

## 2017-03-18 ENCOUNTER — Encounter: Payer: Self-pay | Admitting: Pulmonary Disease

## 2017-03-18 ENCOUNTER — Ambulatory Visit (INDEPENDENT_AMBULATORY_CARE_PROVIDER_SITE_OTHER): Payer: 59 | Admitting: Pulmonary Disease

## 2017-03-18 VITALS — BP 124/60 | HR 61 | Ht 61.0 in | Wt 213.8 lb

## 2017-03-18 DIAGNOSIS — I272 Pulmonary hypertension, unspecified: Secondary | ICD-10-CM

## 2017-03-18 DIAGNOSIS — R0600 Dyspnea, unspecified: Secondary | ICD-10-CM

## 2017-03-18 DIAGNOSIS — G4733 Obstructive sleep apnea (adult) (pediatric): Secondary | ICD-10-CM | POA: Diagnosis not present

## 2017-03-18 DIAGNOSIS — I2729 Other secondary pulmonary hypertension: Secondary | ICD-10-CM | POA: Diagnosis not present

## 2017-03-18 NOTE — Patient Instructions (Signed)
For the abnormal pulmonary function test: We will arrange for a high-resolution CT scan of your chest and call you with the results  Obstructive sleep apnea: Keep using CPAP nightly  Pulmonary hypertension: At this point I don't recommend any changes other than continuing to exercise with pulmonary rehabilitation. If you develop worsening oxygen problems or worsening shortness of breath then Dr. Haroldine Laws and I can evaluate further.  I'll see you back in 6 months or sooner if needed

## 2017-03-18 NOTE — Progress Notes (Signed)
Discharge Progress Report  Patient Details  Name: [name] MRN: [id number] Date of Birth: 09/18/1935 Referring Provider:     Pulmonary Rehab Walk Test from 11/13/2016 in [facility]  Referring Provider  Dr. Bensimhon       Number of Visits: 24  Reason for Discharge:  Patient reached a stable level of exercise. Patient independent in their exercise. Patient has met program and personal goals.  Smoking History:  History  Smoking Status  . Never Smoker  Smokeless Tobacco  . Never Used    Diagnosis:  Pulmonary hypertension (HCC)  ADL UCSD:     Pulmonary Assessment Scores    Row Name 02/27/17 1102         ADL UCSD   ADL Phase Exit     SOB Score total 1       CAT Score   CAT Score 2  Exit        Initial Exercise Prescription:   Discharge Exercise Prescription (Final Exercise Prescription Changes):     Exercise Prescription Changes - 02/26/17 1529      Response to Exercise   Blood Pressure (Admit) 108/60   Blood Pressure (Exercise) 116/64   Blood Pressure (Exit) 108/72   Heart Rate (Admit) 61 bpm   Heart Rate (Exercise) 71 bpm   Heart Rate (Exit) 61 bpm   Oxygen Saturation (Admit) 99 %   Oxygen Saturation (Exercise) 98 %   Oxygen Saturation (Exit) 98 %   Rating of Perceived Exertion (Exercise) 11   Perceived Dyspnea (Exercise) 0   Duration Progress to 45 minutes of aerobic exercise without signs/symptoms of physical distress   Intensity THRR unchanged     Progression   Progression Continue to progress workloads to maintain intensity without signs/symptoms of physical distress.     Resistance Training   Training Prescription Yes   Weight orange bands   Reps 10-15   Time 10 Minutes     Interval Training   Interval Training No     NuStep   Level 6   Minutes 17   METs 2.2     Track   Laps 21   Minutes 34      Functional Capacity:     6 Minute Walk    Row Name 02/28/17 1524         6 Minute  Walk   Phase Discharge     Distance 1323 feet     Distance Feet Change 160 ft     Walk Time -  5 minutes 50 seconds     # of Rest Breaks -  10 seconds rest break     MPH 2.5     METS 2.91     RPE 13     Perceived Dyspnea  1     Symptoms Yes (comment)     Comments used rollator     Resting HR 68 bpm     Resting BP 100/60     Resting Oxygen Saturation  98 %     Exercise Oxygen Saturation  during 6 min walk 92 %     Max Ex. HR 127 bpm     Max Ex. BP 144/80       Interval HR   1 Minute HR 125     2 Minute HR 127     3 Minute HR 106     4 Minute HR 87     5 Minute HR 94       6 Minute HR 95     2 Minute Post HR 93     Interval Heart Rate? Yes       Interval Oxygen   Interval Oxygen? Yes     Baseline Oxygen Saturation % 98 %     1 Minute Oxygen Saturation % 88 %     1 Minute Liters of Oxygen 0 L     2 Minute Liters of Oxygen 0 L     3 Minute Oxygen Saturation % 92 %     3 Minute Liters of Oxygen 0 L     4 Minute Oxygen Saturation % 95 %     4 Minute Liters of Oxygen 0 L     5 Minute Oxygen Saturation % 92 %     5 Minute Liters of Oxygen 0 L     6 Minute Oxygen Saturation % 94 %     6 Minute Liters of Oxygen 0 L     2 Minute Post Oxygen Saturation % 98 %     2 Minute Post Liters of Oxygen 0 L        Psychological, QOL, Others - Outcomes: PHQ 2/9: Depression screen PHQ 2/9 11/09/2016  Decreased Interest 0  Down, Depressed, Hopeless 0  PHQ - 2 Score 0    Quality of Life:   Personal Goals: Goals established at orientation with interventions provided to work toward goal.    Personal Goals Discharge:     Goals and Risk Factor Review    Row Name 01/30/17 2139 02/22/17 1242 03/18/17 0828         Core Components/Risk Factors/Patient Goals Review   Personal Goals Review Weight Management/Obesity;Improve shortness of breath with ADL's;Develop more efficient breathing techniques such as purse lipped breathing and diaphragmatic breathing and practicing self-pacing  with activity. Weight Management/Obesity;Improve shortness of breath with ADL's;Develop more efficient breathing techniques such as purse lipped breathing and diaphragmatic breathing and practicing self-pacing with activity.  -     Review patient has not been able to progress toward weightloss goals however she sees a significant improvement in her shortness of breath. she has been able to maintain oxygen saturations >92 with exertion on room air. dr bensimhon has given [name] permission to discontinue her oxygen patient continue to work hard in pulmonary rehab. she has not began to loose weight but she is extremely happy she does not have to wear oxygen any more. she understands the importance of continuing to check her oxygen saturations to make sure she is maintaining greater than 92 patient met all of her pulmonary rehab goals at discharge     Expected Outcomes expect progression towards goals in the next 30 days expect progression towards goals in the next 30 days  -        Exercise Goals and Review:   Nutrition & Weight - Outcomes:    Nutrition:   Nutrition Discharge:     Nutrition Assessments - 03/06/17 1557      Rate Your Plate Scores   Pre Score 58   Post Score 60      Education Questionnaire Score:     Knowledge Questionnaire Score - 02/27/17 1102      Knowledge Questionnaire Score   Post Score 10/13      Goals reviewed with patient; copy given to patient. [name] has enrolled in the pulmonary rehab maintenance program.

## 2017-03-18 NOTE — Progress Notes (Signed)
Subjective:    Patient ID: [name], female    DOB: Jan 21, 1936, 81 y.o.   MRN: [id number]  Synopsis: Referred in 2018 for evaluation of chronic respiratory failure with hypoxemia and the setting of obstructive sleep apnea and pulmonary hypertension. Found to have obstructive and restrictive lung disease on PFT.   HPI Chief Complaint  Patient presents with  . Follow-up    doing well, slight allergies right now    This is a pleasant 81 year old female who has been referred to me for evaluation of chronic respiratory failure with hypoxemia and the setting of a pulmonary hypertension evaluation. She tells me that around the time she was evaluated for pulmonary hypertension she was having more shortness of breath and her oxygen level was dropping lower when she would exert yourself. However, she has been exercising regularly with pulmonary rehabilitation and she says that her oxygen level has now normalized with exercise and she can now walk a significantly longer distance on a treadmill without feeling short of breath. She reports no shortness of breath at this time, no cough, no wheezing. She will occasionally have a cough if she catches a cold but she says that she's never needed to take an inhaler for this in the past.  She says that she has never smoked cigarettes She works for the [employer] and she says that she has been exposed to various chemicals and dyes over the years, she has had  Some formaldehyde exposure.  She reports that she was diagnosed with obstructive sleep apnea in 2012 but she never really used CPAP regularly until she went for her lung function tests in May 2018. Since then she has been using CPAP routinely and she says she has been feeling better since doing that.  Past Medical History:  Diagnosis Date  . Anemia    takes iron  . Arthritis    Dr Ronnie Derby  . Carotid artery stenosis    1-39% bilateral dopplers 08/2015  . Cataracts, bilateral   . Chronic  kidney disease    stage III Dr Mercy Moore  . Diabetes mellitus   . GERD (gastroesophageal reflux disease)   . Heart murmur    Dr Irish Lack  . HTN (hypertension)   . Hyperlipidemia   . Morbid obesity with BMI of 40.0-44.9, adult ([place])   . Obstructive sleep apnea (adult) (pediatric)    w CPAP Severe OSA AHI 52/hr CPAP at 10cm H2O  . Pulmonary HTN ([place])    mild with PASP 75mmHg echo 02/2014  . Right carotid bruit 08/19/2014  . Shortness of breath    resolved with CPAP  . Urination frequency   . Vitamin D deficiency disease   . Wears dentures    upper  . Wears glasses      Family History  Problem Relation Age of Onset  . Diabetes Mother   . Diabetes Father      Social History   Social History  . Marital status: Widowed    Spouse name: N/A  . Number of children: N/A  . Years of education: N/A   Occupational History  . Not on file.   Social History Main Topics  . Smoking status: Never Smoker  . Smokeless tobacco: Never Used  . Alcohol use No  . Drug use: No  . Sexual activity: No   Other Topics Concern  . Not on file   Social History Narrative  . No narrative on file     Allergies  Allergen  Reactions  . Codeine Itching  . Ibuprofen Other (See Comments)    "gave her kidney trouble"     Outpatient Medications Prior to Visit  Medication Sig Dispense Refill  . acetaminophen (TYLENOL) 500 MG tablet Take 500 mg by mouth every 8 (eight) hours as needed for mild pain, moderate pain, fever or headache.     Marland Kitchen amLODipine (NORVASC) 10 MG tablet Take 10 mg by mouth daily.  6  . aspirin EC 81 MG tablet Take 81 mg by mouth daily.    Marland Kitchen atorvastatin (LIPITOR) 40 MG tablet Take 40 mg by mouth at bedtime.    . Cholecalciferol (VITAMIN D) 2000 UNITS CAPS Take 2,000 Units by mouth daily.    Marland Kitchen docusate sodium (COLACE) 100 MG capsule Take 100 mg by mouth daily as needed for mild constipation.    Marland Kitchen esomeprazole (NEXIUM) 40 MG capsule Take 40 mg by mouth daily.     Marland Kitchen ezetimibe  (ZETIA) 10 MG tablet Take 10 mg by mouth at bedtime.    . furosemide (LASIX) 40 MG tablet Take 40 mg by mouth daily.     . Multiple Vitamins-Minerals (MULTIVITAMINS THER. W/MINERALS) TABS Take 1 tablet by mouth daily.    . pioglitazone (ACTOS) 30 MG tablet Take 30 mg by mouth daily.    Marland Kitchen spironolactone (ALDACTONE) 25 MG tablet TAKE 0.5 TABLETS (12.5 MG TOTAL) BY MOUTH DAILY. 15 tablet 3   No facility-administered medications prior to visit.       Review of Systems  Constitutional: Negative for fever and unexpected weight change.  HENT: Positive for congestion and sore throat. Negative for dental problem, ear pain, nosebleeds, postnasal drip, rhinorrhea, sinus pressure, sneezing and trouble swallowing.   Eyes: Negative for redness and itching.  Respiratory: Negative for cough, chest tightness, shortness of breath and wheezing.   Cardiovascular: Negative for palpitations and leg swelling.  Gastrointestinal: Negative for nausea and vomiting.  Genitourinary: Negative for dysuria.  Musculoskeletal: Negative for joint swelling.  Skin: Negative for rash.  Allergic/Immunologic: Negative.  Negative for environmental allergies, food allergies and immunocompromised state.  Neurological: Negative for headaches.  Hematological: Does not bruise/bleed easily.  Psychiatric/Behavioral: Negative for dysphoric mood. The patient is not nervous/anxious.        Objective:   Physical Exam Vitals:   03/18/17 1614  BP: 124/60  Pulse: 61  SpO2: 97%  Weight: 213 lb 12.8 oz (97 kg)  Height: 5\' 1"  (1.549 m)   Gen: obese, but well appearing, no acute distress HENT: NCAT, OP clear, neck supple without masses Eyes: PERRL, EOMi Lymph: no cervical lymphadenopathy PULM: CTA B CV: RRR, systolic murmur noted, no JVD GI: BS+, soft, nontender, no hsm Derm: ankle edema noted, no rash or skin breakdown MSK: normal bulk and tone Neuro: A&Ox4, CN II-XII intact, strength 5/5 in all 4 extremities Psyche: normal  mood and affect   Sleep study: 2012 sleep study performed by Dr. Radford Pax showed obstructive sleep apnea, apnea-hypopnea index was 53, CPAP started  Pulmonary function test: May 2018 ratio 68%, FEV1 0.95 L 68% predicted, FVC 1.31 L 72% predicted, total lung capacity 3.51L 75 percent predicted, expiratory reserve volume 0.07L, DLCO 11.62 mL 55% predicted     Assessment & Plan:   Dyspnea, unspecified type - Plan: CT Chest High Resolution  Obstructive sleep apnea  Other secondary pulmonary hypertension [facility])  Discussion: 81 year old female with pulmonary hypertension comes to my clinic today for evaluation of the same as well as chronic respiratory failure with hypoxemia.  I am pleased that since starting exercise with pulmonary rehabilitation and using her CPAP machine on a regular basis she has seen improvement in symptoms and she says her oxygen level is normal with exercise. At this point she says she has no respiratory complaints.  In review of her pulmonary function test I see that she did have both airflow obstruction and restrictive lung disease. She also had an abnormal diffusion capacity. The airflow obstruction is likely related to aging, as this can be a normal part of the aging process. She does not have a history of asthma nor does she report wheezing or a significant smoking history. Because her expiratory reserve volume is low I suspect that her restrictive lung disease is due to her obesity. I suspect that obstructive sleep apnea has contributed to some degree to her pulmonary hypertension which explains the diffusion capacity. However, given all the abnormalities seen on the  Test it would be wise to get a high-resolution CT scan of the chest to ensure there is no evidence of pulmonary parenchymal disease. She has had a significant amount of chemical exposures with her occupation over the years.  In regards to her pulmonary hypertension unless she develops worsening symptoms or  new oxygen abnormalities I don't see a reason to change things at this point. She should just continue using CPAP regularly.  Plan: For the abnormal pulmonary function test: We will arrange for a high-resolution CT scan of your chest and call you with the results  Obstructive sleep apnea: Keep using CPAP nightly  Pulmonary hypertension: At this point I don't recommend any changes other than continuing to exercise with pulmonary rehabilitation. If you develop worsening oxygen problems or worsening shortness of breath then Dr. Haroldine Laws and I can evaluate further.  I'll see you back in 6 months or sooner if needed    Current Outpatient Prescriptions:  .  acetaminophen (TYLENOL) 500 MG tablet, Take 500 mg by mouth every 8 (eight) hours as needed for mild pain, moderate pain, fever or headache. , Disp: , Rfl:  .  amLODipine (NORVASC) 10 MG tablet, Take 10 mg by mouth daily., Disp: , Rfl: 6 .  aspirin EC 81 MG tablet, Take 81 mg by mouth daily., Disp: , Rfl:  .  atorvastatin (LIPITOR) 40 MG tablet, Take 40 mg by mouth at bedtime., Disp: , Rfl:  .  Cholecalciferol (VITAMIN D) 2000 UNITS CAPS, Take 2,000 Units by mouth daily., Disp: , Rfl:  .  docusate sodium (COLACE) 100 MG capsule, Take 100 mg by mouth daily as needed for mild constipation., Disp: , Rfl:  .  esomeprazole (NEXIUM) 40 MG capsule, Take 40 mg by mouth daily. , Disp: , Rfl:  .  ezetimibe (ZETIA) 10 MG tablet, Take 10 mg by mouth at bedtime., Disp: , Rfl:  .  furosemide (LASIX) 40 MG tablet, Take 40 mg by mouth daily. , Disp: , Rfl:  .  Multiple Vitamins-Minerals (MULTIVITAMINS THER. W/MINERALS) TABS, Take 1 tablet by mouth daily., Disp: , Rfl:  .  pioglitazone (ACTOS) 30 MG tablet, Take 30 mg by mouth daily., Disp: , Rfl:  .  spironolactone (ALDACTONE) 25 MG tablet, TAKE 0.5 TABLETS (12.5 MG TOTAL) BY MOUTH DAILY., Disp: 15 tablet, Rfl: 3

## 2017-03-19 ENCOUNTER — Encounter (HOSPITAL_COMMUNITY)
Admission: RE | Admit: 2017-03-19 | Discharge: 2017-03-19 | Disposition: A | Discharge status: Home / Self Care | Payer: Self-pay | Source: Ambulatory Visit | Attending: Internal Medicine | Admitting: Internal Medicine

## 2017-03-19 DIAGNOSIS — I272 Pulmonary hypertension, unspecified: Secondary | ICD-10-CM | POA: Insufficient documentation

## 2017-03-21 ENCOUNTER — Inpatient Hospital Stay: Admission: RE | Admit: 2017-03-21 | Payer: 59 | Source: Ambulatory Visit

## 2017-03-21 ENCOUNTER — Encounter (HOSPITAL_COMMUNITY)
Admission: RE | Admit: 2017-03-21 | Discharge: 2017-03-21 | Disposition: A | Discharge status: Home / Self Care | Payer: Self-pay | Source: Ambulatory Visit | Attending: Internal Medicine | Admitting: Internal Medicine

## 2017-03-22 ENCOUNTER — Ambulatory Visit (INDEPENDENT_AMBULATORY_CARE_PROVIDER_SITE_OTHER)
Admission: RE | Admit: 2017-03-22 | Discharge: 2017-03-22 | Disposition: A | Discharge status: Home / Self Care | Payer: 59 | Source: Ambulatory Visit | Attending: Pulmonary Disease | Admitting: Pulmonary Disease

## 2017-03-22 DIAGNOSIS — R0600 Dyspnea, unspecified: Secondary | ICD-10-CM

## 2017-03-26 ENCOUNTER — Encounter (HOSPITAL_COMMUNITY)
Admission: RE | Admit: 2017-03-26 | Discharge: 2017-03-26 | Disposition: A | Discharge status: Home / Self Care | Payer: Self-pay | Source: Ambulatory Visit | Attending: Internal Medicine | Admitting: Internal Medicine

## 2017-03-28 ENCOUNTER — Telehealth: Payer: Self-pay | Admitting: Pulmonary Disease

## 2017-03-28 ENCOUNTER — Encounter (HOSPITAL_COMMUNITY)
Admission: RE | Admit: 2017-03-28 | Discharge: 2017-03-28 | Disposition: A | Discharge status: Home / Self Care | Payer: Self-pay | Source: Ambulatory Visit | Attending: Internal Medicine | Admitting: Internal Medicine

## 2017-03-28 NOTE — Telephone Encounter (Signed)
Spoke with pt, aware of results/recs.  Nothing further needed.  

## 2017-03-28 NOTE — Telephone Encounter (Signed)
Notes recorded by Juanito Doom, MD on 03/26/2017 at 4:24 PM EDT A, Please let the patient know this was OK but her heart size was slightly enlarged. We'll discuss more on the next visit.  Thanks, B  ATC pt, no answer. Left message for pt to call back.

## 2017-03-28 NOTE — Telephone Encounter (Signed)
Patient returning call- she is at work - please call her there at [phone] and ask for her -pr

## 2017-04-02 ENCOUNTER — Telehealth (HOSPITAL_COMMUNITY): Payer: Self-pay | Admitting: Family Medicine

## 2017-04-02 ENCOUNTER — Encounter (HOSPITAL_COMMUNITY): Payer: 59

## 2017-04-04 ENCOUNTER — Encounter (HOSPITAL_COMMUNITY): Payer: 59 | Attending: Internal Medicine

## 2017-04-04 DIAGNOSIS — I272 Pulmonary hypertension, unspecified: Secondary | ICD-10-CM | POA: Insufficient documentation

## 2017-04-09 ENCOUNTER — Encounter (HOSPITAL_COMMUNITY): Payer: 59

## 2017-04-11 ENCOUNTER — Encounter (HOSPITAL_COMMUNITY): Payer: 59

## 2017-04-16 ENCOUNTER — Encounter (HOSPITAL_COMMUNITY): Payer: 59

## 2017-04-18 ENCOUNTER — Encounter (HOSPITAL_COMMUNITY): Payer: 59

## 2017-04-23 ENCOUNTER — Encounter (HOSPITAL_COMMUNITY): Payer: 59

## 2017-04-30 ENCOUNTER — Encounter (HOSPITAL_COMMUNITY): Payer: 59

## 2017-05-02 ENCOUNTER — Encounter (HOSPITAL_COMMUNITY): Payer: 59

## 2017-05-07 ENCOUNTER — Encounter (HOSPITAL_COMMUNITY): Payer: 59 | Attending: Internal Medicine

## 2017-05-07 DIAGNOSIS — I272 Pulmonary hypertension, unspecified: Secondary | ICD-10-CM | POA: Insufficient documentation

## 2017-05-09 ENCOUNTER — Encounter (HOSPITAL_COMMUNITY): Payer: 59

## 2017-05-14 ENCOUNTER — Encounter (HOSPITAL_COMMUNITY): Payer: 59

## 2017-05-16 ENCOUNTER — Encounter (HOSPITAL_COMMUNITY): Payer: 59

## 2017-05-21 ENCOUNTER — Encounter (HOSPITAL_COMMUNITY): Payer: 59

## 2017-05-22 ENCOUNTER — Other Ambulatory Visit (HOSPITAL_COMMUNITY): Payer: Self-pay | Admitting: Cardiology

## 2017-05-23 ENCOUNTER — Encounter (HOSPITAL_COMMUNITY): Payer: 59

## 2017-05-29 ENCOUNTER — Encounter (HOSPITAL_COMMUNITY): Payer: Self-pay | Admitting: Internal Medicine

## 2017-05-29 ENCOUNTER — Ambulatory Visit (HOSPITAL_COMMUNITY)
Admission: RE | Admit: 2017-05-29 | Discharge: 2017-05-29 | Disposition: A | Discharge status: Home / Self Care | Payer: 59 | Source: Ambulatory Visit | Attending: Family Medicine | Admitting: Family Medicine

## 2017-05-29 ENCOUNTER — Ambulatory Visit (HOSPITAL_BASED_OUTPATIENT_CLINIC_OR_DEPARTMENT_OTHER)
Admission: RE | Admit: 2017-05-29 | Discharge: 2017-05-29 | Disposition: A | Discharge status: Home / Self Care | Payer: 59 | Source: Ambulatory Visit | Attending: Internal Medicine | Admitting: Internal Medicine

## 2017-05-29 VITALS — BP 142/86 | HR 76 | Wt 213.0 lb

## 2017-05-29 DIAGNOSIS — I340 Nonrheumatic mitral (valve) insufficiency: Secondary | ICD-10-CM | POA: Diagnosis not present

## 2017-05-29 DIAGNOSIS — G4733 Obstructive sleep apnea (adult) (pediatric): Secondary | ICD-10-CM | POA: Diagnosis not present

## 2017-05-29 DIAGNOSIS — F4321 Adjustment disorder with depressed mood: Secondary | ICD-10-CM | POA: Diagnosis not present

## 2017-05-29 DIAGNOSIS — I272 Pulmonary hypertension, unspecified: Secondary | ICD-10-CM | POA: Diagnosis not present

## 2017-05-29 MED ORDER — CITALOPRAM HYDROBROMIDE 10 MG PO TABS: 10.0000 mg | ORAL_TABLET | Freq: Every day | ORAL | 3 refills | Status: DC

## 2017-05-29 NOTE — Progress Notes (Signed)
  Echocardiogram 2D Echocardiogram has been performed.  [name] 05/29/2017, 11:09 AM

## 2017-05-29 NOTE — Patient Instructions (Signed)
Start Celexa 10 mg daily  We will contact you in 6 months to schedule your next appointment.

## 2017-05-29 NOTE — Progress Notes (Signed)
ADVANCED HF CLINIC CONSULT NOTE   Primary Cardiologist: Radford Pax  HPI:  [name] is a 81 y.o. female with a history of HTN, DM2, CKD, diastolic HF, OSA, obesity and moderate pulmonary HTN. She denies any h/o known CAD or other heart disease. Non smoker.   She was referred by Dr. Radford Pax for evaluation of [place].   Today she returns for [facility] follow up.   Feeling well. Walks with a cane for balance. Works FT at [employer]. Able to do all activities without problem. Completed Pulmonary Rehab and was in maintenance program but now not going. Breathing ok. Off O2. Goes to store without problem. No edema, orthopnea or PND. No syncope. Admits to feeling sad with all the changes in her family this year and became tearful at end of visit.  Also says she gets very frustrated at times.   Saw Dr. Lake Bells this Fall. Doing well. He ordered  Hi-res Chest CT. No ILD.   ECHO today  LVEF 46-27% Grade II diastolic dysfunction RV ok RVSP 15mmHG  ECHO 09/20/16 LVEF 03-50% Grade II diastolic dysfunction RV ok RVSP 59   PFTs 11/01/2016  FEV1 0.93 (67%) FVC  1.36 (75%)  DLCO 55%   Past Medical History:  Diagnosis Date  . Anemia    takes iron  . Arthritis    Dr Ronnie Derby  . Carotid artery stenosis    1-39% bilateral dopplers 08/2015  . Cataracts, bilateral   . Chronic kidney disease    stage III Dr Mercy Moore  . Diabetes mellitus   . GERD (gastroesophageal reflux disease)   . Heart murmur    Dr Irish Lack  . HTN (hypertension)   . Hyperlipidemia   . Morbid obesity with BMI of 40.0-44.9, adult ([place])   . Obstructive sleep apnea (adult) (pediatric)    w CPAP Severe OSA AHI 52/hr CPAP at 10cm H2O  . Pulmonary HTN ([place])    mild with PASP 69mmHg echo 02/2014  . Right carotid bruit 08/19/2014  . Shortness of breath    resolved with CPAP  . Urination frequency   . Vitamin D deficiency disease   . Wears dentures    upper  . Wears glasses     Current Outpatient Medications  Medication Sig  Dispense Refill  . acetaminophen (TYLENOL) 500 MG tablet Take 500 mg by mouth every 8 (eight) hours as needed for mild pain, moderate pain, fever or headache.     Marland Kitchen amLODipine (NORVASC) 10 MG tablet Take 10 mg by mouth daily.  6  . aspirin EC 81 MG tablet Take 81 mg by mouth daily.    Marland Kitchen atorvastatin (LIPITOR) 40 MG tablet Take 40 mg by mouth at bedtime.    . Cholecalciferol (VITAMIN D) 2000 UNITS CAPS Take 2,000 Units by mouth daily.    Marland Kitchen docusate sodium (COLACE) 100 MG capsule Take 100 mg by mouth daily as needed for mild constipation.    Marland Kitchen esomeprazole (NEXIUM) 40 MG capsule Take 40 mg by mouth daily.     Marland Kitchen ezetimibe (ZETIA) 10 MG tablet Take 10 mg by mouth at bedtime.    . furosemide (LASIX) 40 MG tablet Take 40 mg by mouth daily.     . Multiple Vitamins-Minerals (MULTIVITAMINS THER. W/MINERALS) TABS Take 1 tablet by mouth daily.    . pioglitazone (ACTOS) 30 MG tablet Take 30 mg by mouth daily.    Marland Kitchen spironolactone (ALDACTONE) 25 MG tablet Take 12.5 mg by mouth daily.     No  current facility-administered medications for this encounter.     Allergies  Allergen Reactions  . Codeine Itching  . Ibuprofen Other (See Comments)    "gave her kidney trouble"      Social History   Socioeconomic History  . Marital status: Widowed    Spouse name: Not on file  . Number of children: Not on file  . Years of education: Not on file  . Highest education level: Not on file  Social Needs  . Financial resource strain: Not on file  . Food insecurity - worry: Not on file  . Food insecurity - inability: Not on file  . Transportation needs - medical: Not on file  . Transportation needs - non-medical: Not on file  Occupational History  . Not on file  Tobacco Use  . Smoking status: Never Smoker  . Smokeless tobacco: Never Used  Substance and Sexual Activity  . Alcohol use: No  . Drug use: No  . Sexual activity: No  Other Topics Concern  . Not on file  Social History Narrative  . Not on file       Family History  Problem Relation Age of Onset  . Diabetes Mother   . Diabetes Father     Vitals:   05/29/17 1118  BP: (!) 142/86  Pulse: 76  SpO2: 97%  Weight: 213 lb (96.6 kg)      PHYSICAL EXAM: General:  Well appearing. No resp difficulty HEENT: normal Neck: supple. JVP 6. Carotids 2+ bilat; + bruits. No lymphadenopathy or thryomegaly appreciated. Cor: PMI nondisplaced. Regular rate & rhythm. 2/6 AS murmur s2 preserved Lungs: clear Abdomen: obese soft, nontender, nondistended. No hepatosplenomegaly. No bruits or masses. Good bowel sounds. Extremities: no cyanosis, clubbing, rash, edema Neuro: alert & orientedx3, cranial nerves grossly intact. moves all 4 extremities w/o difficulty. Affect pleasant but sad  ASSESSMENT & PLAN:  1. Pulm HTN   - Likely mild to moderate pulmonary HTN due to [facility] II (diastolic HF) and III (OSA/OHS/hypoxia) PH. No role for selective pulmonary vasodilators at this point. - Remains stable NYHA II. Echo reviewed today and no evidence of RV strain. RVSP estimated at 80mmHG which is stable.  - Functional status  improved with pulmonary rehab. Now off O2. Encouraged her to continue with PR Maintenance program - PFTs reviewed personally and shows mixed obstructive/restrictive picture. Hi-res CT - No ILD - Continue CPAP - Continue lasix 40 mg daily - Continue spiro 25 mg daily.   2. Chronic hypoxic respiratory failure - multifactorial. PFTs reviewed personally. Mixed obstructive/restrictive picture - improved with pulmonary rehab. Now off O2 - Has seen  Dr. Lake Bells - Hi-res chest CT - no ILD  3. OSA  - continue CPAP  4. Situational Depression - start Celexa 10  Glori Bickers, MD  11:53 AM

## 2017-05-30 ENCOUNTER — Encounter (HOSPITAL_COMMUNITY): Payer: 59

## 2017-05-31 ENCOUNTER — Other Ambulatory Visit (HOSPITAL_COMMUNITY): Payer: Self-pay | Admitting: Internal Medicine

## 2017-06-06 ENCOUNTER — Encounter (HOSPITAL_COMMUNITY): Payer: 59

## 2017-06-06 DIAGNOSIS — M9905 Segmental and somatic dysfunction of pelvic region: Secondary | ICD-10-CM | POA: Diagnosis not present

## 2017-06-06 DIAGNOSIS — M9904 Segmental and somatic dysfunction of sacral region: Secondary | ICD-10-CM | POA: Diagnosis not present

## 2017-06-06 DIAGNOSIS — N390 Urinary tract infection, site not specified: Secondary | ICD-10-CM | POA: Diagnosis not present

## 2017-06-06 DIAGNOSIS — M9903 Segmental and somatic dysfunction of lumbar region: Secondary | ICD-10-CM | POA: Diagnosis not present

## 2017-06-06 DIAGNOSIS — Z7689 Persons encountering health services in other specified circumstances: Secondary | ICD-10-CM | POA: Diagnosis not present

## 2017-06-06 DIAGNOSIS — D631 Anemia in chronic kidney disease: Secondary | ICD-10-CM | POA: Diagnosis not present

## 2017-06-06 DIAGNOSIS — N183 Chronic kidney disease, stage 3 (moderate): Secondary | ICD-10-CM | POA: Diagnosis not present

## 2017-06-06 DIAGNOSIS — M5136 Other intervertebral disc degeneration, lumbar region: Secondary | ICD-10-CM | POA: Diagnosis not present

## 2017-06-11 ENCOUNTER — Encounter (HOSPITAL_COMMUNITY): Payer: 59

## 2017-06-11 DIAGNOSIS — N2581 Secondary hyperparathyroidism of renal origin: Secondary | ICD-10-CM | POA: Diagnosis not present

## 2017-06-11 DIAGNOSIS — E1122 Type 2 diabetes mellitus with diabetic chronic kidney disease: Secondary | ICD-10-CM | POA: Diagnosis not present

## 2017-06-11 DIAGNOSIS — N390 Urinary tract infection, site not specified: Secondary | ICD-10-CM | POA: Diagnosis not present

## 2017-06-11 DIAGNOSIS — D631 Anemia in chronic kidney disease: Secondary | ICD-10-CM | POA: Diagnosis not present

## 2017-06-11 DIAGNOSIS — B9620 Unspecified Escherichia coli [E. coli] as the cause of diseases classified elsewhere: Secondary | ICD-10-CM | POA: Diagnosis not present

## 2017-06-11 DIAGNOSIS — N183 Chronic kidney disease, stage 3 (moderate): Secondary | ICD-10-CM | POA: Diagnosis not present

## 2017-06-11 DIAGNOSIS — I129 Hypertensive chronic kidney disease with stage 1 through stage 4 chronic kidney disease, or unspecified chronic kidney disease: Secondary | ICD-10-CM | POA: Diagnosis not present

## 2017-06-13 ENCOUNTER — Encounter (HOSPITAL_COMMUNITY): Payer: 59

## 2017-06-13 DIAGNOSIS — M9903 Segmental and somatic dysfunction of lumbar region: Secondary | ICD-10-CM | POA: Diagnosis not present

## 2017-06-13 DIAGNOSIS — Z9889 Other specified postprocedural states: Secondary | ICD-10-CM | POA: Diagnosis not present

## 2017-06-13 DIAGNOSIS — M5030 Other cervical disc degeneration, unspecified cervical region: Secondary | ICD-10-CM | POA: Diagnosis not present

## 2017-06-13 DIAGNOSIS — M9904 Segmental and somatic dysfunction of sacral region: Secondary | ICD-10-CM | POA: Diagnosis not present

## 2017-06-13 DIAGNOSIS — M5136 Other intervertebral disc degeneration, lumbar region: Secondary | ICD-10-CM | POA: Diagnosis not present

## 2017-06-13 DIAGNOSIS — M9905 Segmental and somatic dysfunction of pelvic region: Secondary | ICD-10-CM | POA: Diagnosis not present

## 2017-06-13 DIAGNOSIS — M25512 Pain in left shoulder: Secondary | ICD-10-CM | POA: Diagnosis not present

## 2017-06-17 DIAGNOSIS — M25552 Pain in left hip: Secondary | ICD-10-CM | POA: Diagnosis not present

## 2017-06-17 DIAGNOSIS — M4186 Other forms of scoliosis, lumbar region: Secondary | ICD-10-CM | POA: Diagnosis not present

## 2017-06-17 DIAGNOSIS — M545 Low back pain: Secondary | ICD-10-CM | POA: Diagnosis not present

## 2017-06-17 DIAGNOSIS — M62838 Other muscle spasm: Secondary | ICD-10-CM | POA: Diagnosis not present

## 2017-06-17 DIAGNOSIS — M5136 Other intervertebral disc degeneration, lumbar region: Secondary | ICD-10-CM | POA: Diagnosis not present

## 2017-06-18 ENCOUNTER — Encounter (HOSPITAL_COMMUNITY): Payer: 59

## 2017-06-20 ENCOUNTER — Encounter (HOSPITAL_COMMUNITY): Payer: 59

## 2017-06-25 ENCOUNTER — Encounter (HOSPITAL_COMMUNITY): Payer: 59

## 2017-06-25 DIAGNOSIS — N390 Urinary tract infection, site not specified: Secondary | ICD-10-CM | POA: Diagnosis not present

## 2017-06-27 ENCOUNTER — Encounter (HOSPITAL_COMMUNITY): Payer: 59

## 2017-07-02 ENCOUNTER — Encounter (HOSPITAL_COMMUNITY): Payer: 59

## 2017-07-04 ENCOUNTER — Encounter (HOSPITAL_COMMUNITY): Payer: 59

## 2017-07-09 ENCOUNTER — Encounter (HOSPITAL_COMMUNITY): Payer: 59

## 2017-07-11 ENCOUNTER — Encounter (HOSPITAL_COMMUNITY): Payer: 59

## 2017-07-16 ENCOUNTER — Encounter (HOSPITAL_COMMUNITY): Payer: 59

## 2017-07-18 ENCOUNTER — Encounter (HOSPITAL_COMMUNITY): Payer: 59

## 2017-07-23 ENCOUNTER — Encounter (HOSPITAL_COMMUNITY): Payer: 59

## 2017-07-25 ENCOUNTER — Encounter (HOSPITAL_COMMUNITY): Payer: 59

## 2017-07-29 DIAGNOSIS — M19011 Primary osteoarthritis, right shoulder: Secondary | ICD-10-CM | POA: Diagnosis not present

## 2017-07-30 ENCOUNTER — Encounter (HOSPITAL_COMMUNITY): Payer: 59

## 2017-08-01 ENCOUNTER — Encounter (HOSPITAL_COMMUNITY): Payer: 59

## 2017-08-06 ENCOUNTER — Encounter (HOSPITAL_COMMUNITY): Payer: 59

## 2017-08-08 ENCOUNTER — Encounter (HOSPITAL_COMMUNITY): Payer: 59

## 2017-08-13 ENCOUNTER — Encounter (HOSPITAL_COMMUNITY): Payer: 59

## 2017-08-13 IMAGING — US ULTRASOUND RIGHT BREAST LIMITED
1 series · 2 of 2 positions shown · non-contrast
Comparison: Previous exam(s).

CLINICAL DATA: Discoloration of the right nipple for 2 years since
previous cholecystectomy. Recent bleeding and intermittent chronic
scabbing.

EXAM:
2D DIGITAL DIAGNOSTIC BILATERAL MAMMOGRAM WITH CAD AND ADJUNCT TOMO
ULTRASOUND RIGHT BREAST

[Series 1: ultrasound right breast limited · 0.07mm/px · 2 of 2 slices shown]
[im 1/2]
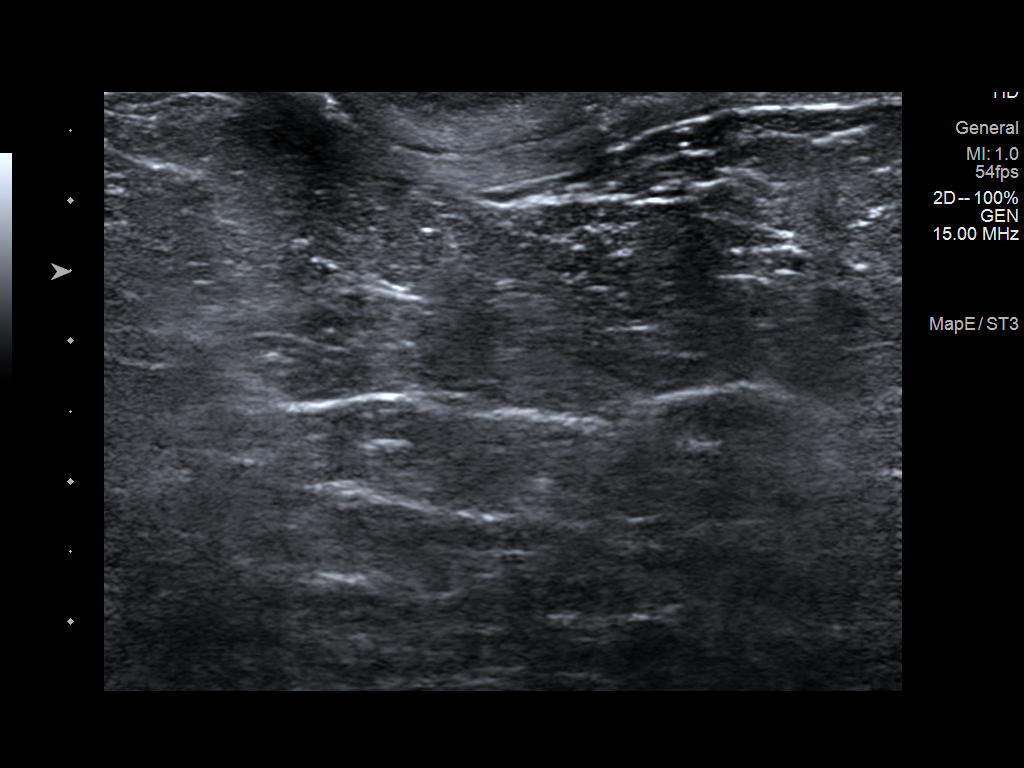
[im 2/2]
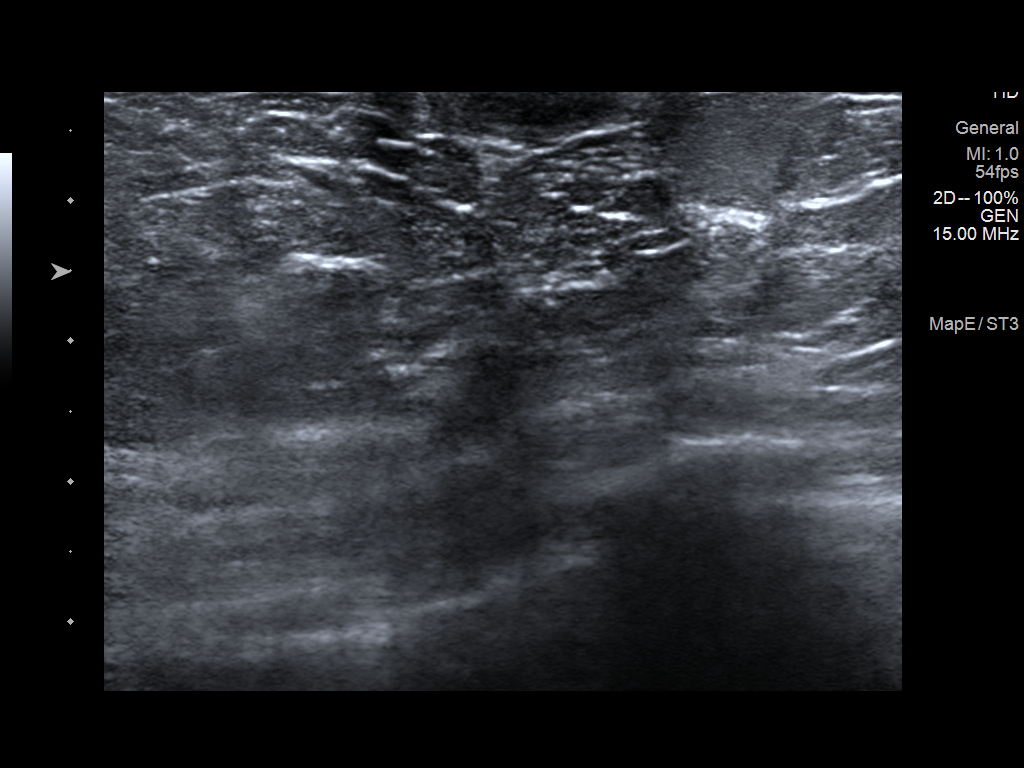

[2 of 2 positions shown; findings below may reference images not displayed]

ACR Breast Density Category b: There are scattered areas of
fibroglandular density.
FINDINGS: No suspicious masses, calcifications, or distortion.

Mammographic images were processed with CAD.

On physical exam, the right nipple is discolored, pink in color.
Additionally, the nipple appears somewhat raw. No current bleeding
or discharge.

Targeted ultrasound is performed, showing no sonographic
abnormalities.
IMPRESSION: No mammographic or sonographic evidence of malignancy. 2 years of
nipple discoloration, dryness, and intermittent scabbing/bleeding.

RECOMMENDATION:
Recommend dermatologic consultation with strong consideration of a
punch biopsy to exclude Paget's disease.

I have discussed the findings and recommendations with the patient.
Results were also provided in writing at the conclusion of the
visit. If applicable, a reminder letter will be sent to the patient
regarding the next appointment.

BI-RADS CATEGORY  2: Benign.

## 2017-08-14 DIAGNOSIS — E119 Type 2 diabetes mellitus without complications: Secondary | ICD-10-CM | POA: Diagnosis not present

## 2017-08-14 DIAGNOSIS — H04123 Dry eye syndrome of bilateral lacrimal glands: Secondary | ICD-10-CM | POA: Diagnosis not present

## 2017-08-14 DIAGNOSIS — H35033 Hypertensive retinopathy, bilateral: Secondary | ICD-10-CM | POA: Diagnosis not present

## 2017-08-14 DIAGNOSIS — H2513 Age-related nuclear cataract, bilateral: Secondary | ICD-10-CM | POA: Diagnosis not present

## 2017-08-15 ENCOUNTER — Encounter (HOSPITAL_COMMUNITY): Payer: 59

## 2017-08-15 DIAGNOSIS — H20022 Recurrent acute iridocyclitis, left eye: Secondary | ICD-10-CM | POA: Diagnosis not present

## 2017-08-19 ENCOUNTER — Other Ambulatory Visit (HOSPITAL_COMMUNITY): Payer: Self-pay | Admitting: *Deleted

## 2017-08-19 DIAGNOSIS — H04123 Dry eye syndrome of bilateral lacrimal glands: Secondary | ICD-10-CM | POA: Diagnosis not present

## 2017-08-19 DIAGNOSIS — H35033 Hypertensive retinopathy, bilateral: Secondary | ICD-10-CM | POA: Diagnosis not present

## 2017-08-19 DIAGNOSIS — H2513 Age-related nuclear cataract, bilateral: Secondary | ICD-10-CM | POA: Diagnosis not present

## 2017-08-19 DIAGNOSIS — H20022 Recurrent acute iridocyclitis, left eye: Secondary | ICD-10-CM | POA: Diagnosis not present

## 2017-08-19 DIAGNOSIS — E119 Type 2 diabetes mellitus without complications: Secondary | ICD-10-CM | POA: Diagnosis not present

## 2017-08-19 MED ORDER — CITALOPRAM HYDROBROMIDE 10 MG PO TABS: 10.0000 mg | ORAL_TABLET | Freq: Every day | ORAL | 3 refills | Status: DC

## 2017-08-20 ENCOUNTER — Encounter (HOSPITAL_COMMUNITY): Payer: 59

## 2017-08-22 ENCOUNTER — Encounter (HOSPITAL_COMMUNITY): Payer: 59

## 2017-08-23 DIAGNOSIS — R0789 Other chest pain: Secondary | ICD-10-CM | POA: Diagnosis not present

## 2017-08-27 ENCOUNTER — Encounter (HOSPITAL_COMMUNITY): Payer: 59

## 2017-08-29 ENCOUNTER — Encounter (HOSPITAL_COMMUNITY): Payer: 59

## 2017-09-03 ENCOUNTER — Encounter (HOSPITAL_COMMUNITY): Payer: 59

## 2017-09-05 ENCOUNTER — Encounter (HOSPITAL_COMMUNITY): Payer: 59

## 2017-09-10 ENCOUNTER — Encounter (HOSPITAL_COMMUNITY): Payer: 59

## 2017-09-12 ENCOUNTER — Encounter (HOSPITAL_COMMUNITY): Payer: 59

## 2017-09-14 ENCOUNTER — Other Ambulatory Visit (HOSPITAL_COMMUNITY): Payer: Self-pay | Admitting: Cardiology

## 2017-09-17 ENCOUNTER — Encounter (HOSPITAL_COMMUNITY): Payer: 59

## 2017-09-19 ENCOUNTER — Encounter (HOSPITAL_COMMUNITY): Payer: 59

## 2017-09-24 ENCOUNTER — Encounter (HOSPITAL_COMMUNITY): Payer: 59

## 2017-09-26 ENCOUNTER — Encounter (HOSPITAL_COMMUNITY): Payer: 59

## 2017-10-01 ENCOUNTER — Encounter (HOSPITAL_COMMUNITY): Payer: 59

## 2017-10-03 ENCOUNTER — Encounter (HOSPITAL_COMMUNITY): Payer: 59

## 2017-10-08 ENCOUNTER — Encounter (HOSPITAL_COMMUNITY): Payer: 59

## 2017-10-10 ENCOUNTER — Encounter (HOSPITAL_COMMUNITY): Payer: 59

## 2017-11-25 DIAGNOSIS — M5136 Other intervertebral disc degeneration, lumbar region: Secondary | ICD-10-CM | POA: Diagnosis not present

## 2017-11-25 DIAGNOSIS — M9904 Segmental and somatic dysfunction of sacral region: Secondary | ICD-10-CM | POA: Diagnosis not present

## 2017-11-25 DIAGNOSIS — M9905 Segmental and somatic dysfunction of pelvic region: Secondary | ICD-10-CM | POA: Diagnosis not present

## 2017-11-25 DIAGNOSIS — M9903 Segmental and somatic dysfunction of lumbar region: Secondary | ICD-10-CM | POA: Diagnosis not present

## 2017-11-28 DIAGNOSIS — M9903 Segmental and somatic dysfunction of lumbar region: Secondary | ICD-10-CM | POA: Diagnosis not present

## 2017-11-28 DIAGNOSIS — M5136 Other intervertebral disc degeneration, lumbar region: Secondary | ICD-10-CM | POA: Diagnosis not present

## 2017-11-28 DIAGNOSIS — M9904 Segmental and somatic dysfunction of sacral region: Secondary | ICD-10-CM | POA: Diagnosis not present

## 2017-11-28 DIAGNOSIS — M9905 Segmental and somatic dysfunction of pelvic region: Secondary | ICD-10-CM | POA: Diagnosis not present

## 2017-12-03 DIAGNOSIS — M9903 Segmental and somatic dysfunction of lumbar region: Secondary | ICD-10-CM | POA: Diagnosis not present

## 2017-12-03 DIAGNOSIS — M5136 Other intervertebral disc degeneration, lumbar region: Secondary | ICD-10-CM | POA: Diagnosis not present

## 2017-12-03 DIAGNOSIS — M9905 Segmental and somatic dysfunction of pelvic region: Secondary | ICD-10-CM | POA: Diagnosis not present

## 2017-12-03 DIAGNOSIS — M9904 Segmental and somatic dysfunction of sacral region: Secondary | ICD-10-CM | POA: Diagnosis not present

## 2018-01-15 ENCOUNTER — Other Ambulatory Visit (HOSPITAL_COMMUNITY): Payer: Self-pay | Admitting: Internal Medicine

## 2018-01-16 ENCOUNTER — Other Ambulatory Visit: Payer: Self-pay | Admitting: Family Medicine

## 2018-01-16 DIAGNOSIS — R6889 Other general symptoms and signs: Secondary | ICD-10-CM

## 2018-01-22 ENCOUNTER — Ambulatory Visit
Admission: RE | Admit: 2018-01-22 | Discharge: 2018-01-22 | Disposition: A | Discharge status: Home / Self Care | Payer: 59 | Source: Ambulatory Visit | Attending: Family Medicine | Admitting: Family Medicine

## 2018-01-22 DIAGNOSIS — R6889 Other general symptoms and signs: Secondary | ICD-10-CM

## 2018-01-30 ENCOUNTER — Telehealth: Payer: Self-pay | Admitting: *Deleted

## 2018-01-30 NOTE — Telephone Encounter (Signed)
Informed patient of compliance results and verbalized understanding was indicated. Patient is aware and agreeable to AHI being within range at 0.3. Patient is aware and agreeable to being in compliance with machine usage. Patient is aware and agreeable to no change in current pressures. 

## 2018-01-30 NOTE — Telephone Encounter (Signed)
-----   Message from Sueanne Margarita, MD sent at 01/28/2018  7:51 PM EDT ----- Good AHI and compliance.  Continue current PAP settings.

## 2018-01-30 NOTE — Telephone Encounter (Signed)
Patient has an appointment scheduled for Tuesday 04/08/18 at 8:40 with dr Radford Pax for a new cpap.  Pt is aware and agreeable to treatment.

## 2018-03-12 ENCOUNTER — Other Ambulatory Visit (HOSPITAL_COMMUNITY): Payer: Self-pay | Admitting: Internal Medicine

## 2018-04-01 ENCOUNTER — Other Ambulatory Visit: Payer: Self-pay | Admitting: Surgery

## 2018-04-02 ENCOUNTER — Encounter: Payer: Self-pay | Admitting: Oncology

## 2018-04-02 ENCOUNTER — Other Ambulatory Visit: Payer: Self-pay | Admitting: Surgery

## 2018-04-02 ENCOUNTER — Telehealth: Payer: Self-pay | Admitting: Oncology

## 2018-04-02 DIAGNOSIS — C50011 Malignant neoplasm of nipple and areola, right female breast: Secondary | ICD-10-CM

## 2018-04-02 NOTE — Telephone Encounter (Signed)
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 Pt has been cld and scheduled to see Dr. Jana Hakim on 11/5 at 430pm. Pt aware to arrive 30 minutes early labs at 4pm. Letter mailed.

## 2018-04-07 ENCOUNTER — Ambulatory Visit
Admission: RE | Admit: 2018-04-07 | Discharge: 2018-04-07 | Disposition: A | Discharge status: Home / Self Care | Payer: Medicare Other | Source: Ambulatory Visit | Attending: Surgery | Admitting: Surgery

## 2018-04-07 ENCOUNTER — Other Ambulatory Visit: Payer: Self-pay

## 2018-04-07 DIAGNOSIS — C50011 Malignant neoplasm of nipple and areola, right female breast: Secondary | ICD-10-CM

## 2018-04-07 DIAGNOSIS — C50019 Malignant neoplasm of nipple and areola, unspecified female breast: Secondary | ICD-10-CM

## 2018-04-07 NOTE — Progress Notes (Signed)
Cardiology Office Note:    Date:  04/08/2018   ID:  [name], DOB Mar 26, 1936, MRN [id number]  PCP:  Leighton Ruff, MD  Cardiologist:  No primary care provider on file.   Referring MD: Leighton Ruff, MD   Chief Complaint  Patient presents with  . Sleep Apnea  . Hypertension    History of Present Illness:    [name] is a 82 y.o. female with a hx of OSA, obesity, moderate pulmonary HTN, diastolic dysfunction and HTN.  She is doing well with her CPAP device.  She tolerates the mask and feels the pressure is adequate.  Since going on CPAP she feels rested in the am and has no significant daytime sleepiness.  She denies any significant mouth or nasal dryness or nasal congestion.  She does not think that he snores.  She is here today for followup and is doing well.  she denies any chest pain or pressure, SOB, DOE, PND, orthopnea, LE edema, dizziness, palpitations or syncope. She is compliant with her meds and is tolerating meds with no SE.     Past Medical History:  Diagnosis Date  . Anemia    takes iron  . Arthritis    Dr Ronnie Derby  . Carotid artery stenosis    1-39% bilateral dopplers 08/2015  . Cataracts, bilateral   . Chronic kidney disease    stage III Dr Mercy Moore  . Diabetes mellitus   . GERD (gastroesophageal reflux disease)   . Heart murmur    Dr Irish Lack  . HTN (hypertension)   . Hyperlipidemia   . Morbid obesity with BMI of 40.0-44.9, adult ([place])   . Obstructive sleep apnea (adult) (pediatric)    w CPAP Severe OSA AHI 52/hr CPAP at 10cm H2O  . Pulmonary HTN ([place])    mild with PASP 72mmHg echo 02/2014  . Right carotid bruit 08/19/2014  . Shortness of breath    resolved with CPAP  . Urination frequency   . Vitamin D deficiency disease   . Wears dentures    upper  . Wears glasses     Past Surgical History:  Procedure Laterality Date  . ABDOMINAL HYSTERECTOMY     partial  . APPENDECTOMY    . CARDIOVASCULAR STRESS TEST  2011  . CHOLECYSTECTOMY  N/A 10/04/2015   Procedure: LAPAROSCOPIC CHOLECYSTECTOMY;  Surgeon: Coralie Keens, MD;  Location: [facility];  Service: General;  Laterality: N/A;  . COLONOSCOPY  01/15/2012   Procedure: COLONOSCOPY;  Surgeon: Lear Ng, MD;  Location: [facility];  Service: Endoscopy;  Laterality: N/A;  . ESOPHAGOGASTRODUODENOSCOPY  01/15/2012   Procedure: ESOPHAGOGASTRODUODENOSCOPY (EGD);  Surgeon: Lear Ng, MD;  Location: [facility];  Service: Endoscopy;  Laterality: N/A;  . JOINT REPLACEMENT     bilateral  . left shoulder surgery      rotator cuff tear   . sleep study  2012  . TOTAL KNEE ARTHROPLASTY     Left  . TOTAL KNEE ARTHROPLASTY  07/02/2011   Procedure: TOTAL KNEE ARTHROPLASTY;  Surgeon: Rudean Haskell, MD;  Location: [place];  Service: Orthopedics;  Laterality: Right;  . US ECHOCARDIOGRAPHY     2012    Current Medications: Current Meds  Medication Sig  . acetaminophen (TYLENOL) 500 MG tablet Take 500 mg by mouth every 8 (eight) hours as needed for mild pain, moderate pain, fever or headache.   Marland Kitchen amLODipine (NORVASC) 10 MG tablet Take 10 mg by mouth daily.  Marland Kitchen aspirin EC 81 MG  tablet Take 81 mg by mouth daily.  Marland Kitchen atorvastatin (LIPITOR) 40 MG tablet Take 40 mg by mouth at bedtime.  . Cholecalciferol (VITAMIN D) 2000 UNITS CAPS Take 2,000 Units by mouth daily.  . citalopram (CELEXA) 10 MG tablet Take 1 tablet (10 mg total) by mouth daily.  Marland Kitchen docusate sodium (COLACE) 100 MG capsule Take 100 mg by mouth daily as needed for mild constipation.  Marland Kitchen esomeprazole (NEXIUM) 40 MG capsule Take 40 mg by mouth daily.   Marland Kitchen ezetimibe (ZETIA) 10 MG tablet Take 10 mg by mouth at bedtime.  . furosemide (LASIX) 40 MG tablet Take 40 mg by mouth daily.   . Multiple Vitamins-Minerals (MULTIVITAMINS THER. W/MINERALS) TABS Take 1 tablet by mouth daily.  . pioglitazone (ACTOS) 30 MG tablet Take 30 mg by mouth daily.  Marland Kitchen spironolactone (ALDACTONE) 25 MG tablet Take 12.5 mg by mouth daily.      Allergies:   Codeine and Ibuprofen   Social History   Socioeconomic History  . Marital status: Widowed    Spouse name: Not on file  . Number of children: Not on file  . Years of education: Not on file  . Highest education level: Not on file  Occupational History  . Not on file  Social Needs  . Financial resource strain: Not on file  . Food insecurity:    Worry: Not on file    Inability: Not on file  . Transportation needs:    Medical: Not on file    Non-medical: Not on file  Tobacco Use  . Smoking status: Never Smoker  . Smokeless tobacco: Never Used  Substance and Sexual Activity  . Alcohol use: No  . Drug use: No  . Sexual activity: Never  Lifestyle  . Physical activity:    Days per week: Not on file    Minutes per session: Not on file  . Stress: Not on file  Relationships  . Social connections:    Talks on phone: Not on file    Gets together: Not on file    Attends religious service: Not on file    Active member of club or organization: Not on file    Attends meetings of clubs or organizations: Not on file    Relationship status: Not on file  Other Topics Concern  . Not on file  Social History Narrative  . Not on file     Family History: The patient's family history includes Diabetes in her father and mother.  ROS:   Please see the history of present illness.    ROS  All other systems reviewed and negative.   EKGs/Labs/Other Studies Reviewed:    The following studies were reviewed today: PAP download  EKG:  EKG is ordered today and showed NSR at 61bpm with nonspecific T wave abnormality  Recent Labs: No results found for requested labs within last 8760 hours.   Recent Lipid Panel No results found for: CHOL, TRIG, HDL, CHOLHDL, VLDL, LDLCALC, LDLDIRECT  Physical Exam:    VS:  BP 140/74   Pulse 61   Ht 5\' 1"  (1.549 m)   Wt 214 lb 6.4 oz (97.3 kg)   SpO2 90%   BMI 40.51 kg/m     Wt Readings from Last 3 Encounters:  04/08/18 214 lb 6.4  oz (97.3 kg)  05/29/17 213 lb (96.6 kg)  03/18/17 213 lb 12.8 oz (97 kg)     GEN:  Well nourished, well developed in no acute distress HEENT: Normal NECK:  No JVD; No carotid bruits LYMPHATICS: No lymphadenopathy CARDIAC: RRR, no murmurs, rubs, gallops RESPIRATORY:  Clear to auscultation without rales, wheezing or rhonchi  ABDOMEN: Soft, non-tender, non-distended MUSCULOSKELETAL:  No edema; No deformity  SKIN: Warm and dry NEUROLOGIC:  Alert and oriented x 3 PSYCHIATRIC:  Normal affect   ASSESSMENT:    1. Obstructive sleep apnea   2. Essential hypertension   3. Morbid obesity with BMI of 40.0-44.9, adult ([place])   4. Pulmonary HTN ([place])   5. Bilateral carotid artery stenosis    PLAN:    In order of problems listed above:  1.  OSA - the patient is tolerating PAP therapy well without any problems. The PAP download was reviewed today and showed an AHI of 0.3/hr on 10 cm H2O with 90% compliance in using more than 4 hours nightly.  The patient has been using and benefiting from PAP use and will continue to benefit from therapy. I am going to order a new PAP device as hers is not working well anymore.  I will order a Resmed CPAP with heated humidity and mask of choice set at 10cm H2O as well as supplies.  She will see me back in 10 weeks to document compliance as required by insurance.   2.  Hypertension -BP was well controlled on exam today.  She will continue on amlodipine 10 mg daily and spironolactone 12.5 mg daily.  3.  Morbid obesity - I have encouraged her to get into a routine exercise program and cut back on carbs and portions.   4.  Pulmonary HTN - likely due to WHO Group II (diastolic HF) and III (OSA/OHS/hypoxia) PH. No role for selective pulmonary vasodilators at this point. Remains stable NYHA II. Functional status improved with pulmonary rehab. Now off O2. PFTs  shows mixed obstructive/restrictive picture. Hi-res CT - No ILD.  2D echo 05/2017 showed PASP 74mmHg.  Will repeat  echo to make sure this is stable.  Continue diuretics (Lasix and spiro) and CPAP therapy.  Creatinine was 1.1 on 01/15/2018.  I am going to get an overnight pulse ox on CPAP to make sure she does not have residual nocturnal hypoxemia.   5.  Carotid stenosis - dopplers 01/2016 showed 1-39% stenosis bilaterally.  Repeat dopplers to make sure this is stable. Continue ASA and statin.    Medication Adjustments/Labs and Tests Ordered: Current medicines are reviewed at length with the patient today.  Concerns regarding medicines are outlined above.  Orders Placed This Encounter  Procedures  . EKG 12-Lead   No orders of the defined types were placed in this encounter.   Signed, Fransico Him, MD  04/08/2018 9:00 AM    [place]

## 2018-04-07 NOTE — Progress Notes (Signed)
[place]  [phone] [phone]     ID: ALLIZON WOZNICK DOB: Mar 15, 1936  MR#: [id number]  LNL#:[id number]  Patient Care Team: Leighton Ruff, MD as PCP - General (Family Medicine) Lawerance Cruel, MD as Consulting Physician (Family Medicine) Coralie Keens, MD as Consulting Physician (General Surgery) Alline Pio, Virgie Dad, MD as Consulting Physician (Oncology) Bensimhon, Shaune Pascal, MD as Consulting Physician (Cardiology) Eppie Gibson, MD as Attending Physician (Radiation Oncology) Allyn Kenner, MD (Dermatology) PCP: Leighton Ruff, MD Chauncey Cruel, MD GYN: SU:  OTHER MD:  CHIEF COMPLAINT: Padget's disease of the right nipple, estrogen receptor negative, HER-2 amplified  CURRENT TREATMENT: Awaiting definitive surgery    HISTORY OF PRESENT ILLNESS: JANINE RELLER is a 82 y.o. female with a new diagnosis of Paget's disease of the right nipple.   The patient reports discoloration of the right nipple present for approximately 2 years.  She eventually developed some nipple bleeding and scabbing.  She had bilateral diagnostic mammography with tomography and right breast ultrasonography at the [facility] [place] [facility] 01/04/2017 showing the breast density to be category B.  Mammography and ultrasonography were unremarkable, but on exam the right nipple was discolored, pink and somewhat scabbed.  There was no bleeding or discharge.  As the nipple problem persisted the patient was referred to dermatology and on 03/07/2018 Dr. [name] did a shave biopsy of the right nipple showing ([id number]) Paget's disease of the nipple, estrogen and progesterone receptor negative, but HER-2 amplified by immunohistochemistry (3+).  The patient was then referred to Dr. Ninfa Linden who set her up for repeat mammogram and Breast US done on 04/07/2018.  This showed recently diagnosed Paget's disease of the right nipple. No evidence of malignancy beyond the right nipple and  areola. No evidence of metastatic adenopathy. Normal appearance of the left breast.   The patient's subsequent history is as detailed below  INTERVAL HISTORY TAIZ BICKLE is a 82 y.o. female evaluated at the breast clinic on 04/08/2018 with her daughter [name].  REVIEW OF SYSTEMS: She states that she had knee replacement, is still hurting from that, and is having shoulder pain now. Aside from the nipple findings there were no other symptoms leading to mammography.  The patient drives, shops, cooks, and does a lot of housework.  The patient denies unusual headaches, visual changes, nausea, vomiting, stiff neck, dizziness, or gait imbalance. There has been no cough, phlegm production, or pleurisy, no chest pain or pressure, and no change in bowel or bladder habits. The patient denies fever, rash, bleeding, unexplained fatigue or unexplained weight loss. A detailed review of systems was otherwise entirely negative.   Allergies  Allergen Reactions  . Codeine Itching  . Ibuprofen Other (See Comments)    "gave her kidney trouble"    Current Outpatient Medications  Medication Sig Dispense Refill  . acetaminophen (TYLENOL) 500 MG tablet Take 500 mg by mouth every 8 (eight) hours as needed for mild pain, moderate pain, fever or headache.     Marland Kitchen amLODipine (NORVASC) 10 MG tablet Take 10 mg by mouth daily.  6  . aspirin EC 81 MG tablet Take 81 mg by mouth daily.    Marland Kitchen atorvastatin (LIPITOR) 40 MG tablet Take 40 mg by mouth at bedtime.    . Cholecalciferol (VITAMIN D) 2000 UNITS CAPS Take 2,000 Units by mouth daily.    Marland Kitchen docusate sodium (COLACE) 100 MG capsule Take 100 mg by mouth daily as needed for mild constipation.    Marland Kitchen  esomeprazole (NEXIUM) 40 MG capsule Take 40 mg by mouth daily.     Marland Kitchen ezetimibe (ZETIA) 10 MG tablet Take 10 mg by mouth at bedtime.    . furosemide (LASIX) 40 MG tablet Take 40 mg by mouth daily.     . Multiple Vitamins-Minerals (MULTIVITAMINS THER. W/MINERALS) TABS Take 1 tablet  by mouth daily.    . pioglitazone (ACTOS) 30 MG tablet Take 30 mg by mouth daily.    Marland Kitchen spironolactone (ALDACTONE) 25 MG tablet Take 12.5 mg by mouth daily.    . citalopram (CELEXA) 10 MG tablet Take 1 tablet (10 mg total) by mouth daily. (Patient not taking: Reported on 04/08/2018) 90 tablet 3   No current facility-administered medications for this visit.     PAST MEDICAL HISTORY: Past Medical History:  Diagnosis Date  . Anemia    takes iron  . Arthritis    Dr Ronnie Derby  . Carotid artery stenosis    1-39% bilateral dopplers 08/2015  . Cataracts, bilateral   . Chronic kidney disease    stage III Dr Mercy Moore  . Diabetes mellitus   . GERD (gastroesophageal reflux disease)   . Heart murmur    Dr Irish Lack  . HTN (hypertension)   . Hyperlipidemia   . Morbid obesity with BMI of 40.0-44.9, adult ([place])   . Obstructive sleep apnea (adult) (pediatric)    w CPAP Severe OSA AHI 52/hr CPAP at 10cm H2O  . Pulmonary HTN ([place])    mild with PASP 14mHg echo 02/2014  . Right carotid bruit 08/19/2014  . Shortness of breath    resolved with CPAP  . Urination frequency   . Vitamin D deficiency disease   . Wears dentures    upper  . Wears glasses     PAST SURGICAL HISTORY: Past Surgical History:  Procedure Laterality Date  . ABDOMINAL HYSTERECTOMY     partial  . APPENDECTOMY    . CARDIOVASCULAR STRESS TEST  2011  . CHOLECYSTECTOMY N/A 10/04/2015   Procedure: LAPAROSCOPIC CHOLECYSTECTOMY;  Surgeon: DCoralie Keens MD;  Location: [facility];  Service: General;  Laterality: N/A;  . COLONOSCOPY  01/15/2012   Procedure: COLONOSCOPY;  Surgeon: VLear Ng MD;  Location: [facility];  Service: Endoscopy;  Laterality: N/A;  . ESOPHAGOGASTRODUODENOSCOPY  01/15/2012   Procedure: ESOPHAGOGASTRODUODENOSCOPY (EGD);  Surgeon: VLear Ng MD;  [facility];  Service: Endoscopy;  Laterality: N/A;  . JOINT REPLACEMENT     bilateral  . left shoulder surgery      rotator cuff tear   .  sleep study  2012  . TOTAL KNEE ARTHROPLASTY     Left  . TOTAL KNEE ARTHROPLASTY  07/02/2011   Procedure: TOTAL KNEE ARTHROPLASTY;  Surgeon: SRudean Haskell MD;  [facility]  Service: Orthopedics;  Laterality: Right;  . [place]ARDIOGRAPHY     2012    FAMILY HISTORY Family History  Problem Relation Age of Onset  . Diabetes Mother   . Diabetes Father   Her father was diabetic, and died after a fall in his early 951s Her mother was also a diabetic, died in her 882sdue to diabetic complications. The patient has one full brother and 563half brothers. She has 2 full sisters, one passed away from tonic obstructive pulmonary disease at age 80  The patient also had 2 half-sisters, one half sister died from a heart disease.  3 out of 6 maternal aunts had breast cancer. She denies a family history of ovarian cancer.  GENETICS TESTING: Be scheduled  GYNECOLOGIC HISTORY:  Menarche: 28 YO First child: age 10 GXP2  Hysterectomy in her 59s. She took hormone replacement for almost 30 years. 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 She lives with her daughter [name] and 2 granddaughters, aged 67 (a [employer] teaching pregnancy centers), and 25 (completed college but not currently working. Her daughter [name] works at [employer] Her son [name] lives in [place] and has 2 children.  The patient has 2 great-grandchildren.  She is a member of a local [employer]: <no information>   HEALTH MAINTENANCE: Social History   Tobacco Use  . Smoking status: Never Smoker  . Smokeless tobacco: Never Used  Substance Use Topics  . Alcohol use: No  . Drug use: No     Colonoscopy: at [facility]  PAP:  Bone density: Never  Lipid panel:    OBJECTIVE: Morbidly obese African-American woman who appears stated age 27:   04/08/18 1600  BP: (!) 153/68  Pulse: 61  Resp: 18  Temp: 97.7 F (36.5 C)  SpO2: 100%     Body mass index  is 40.66 kg/m.    ECOG FS:1 - Symptomatic but completely ambulatory Vitals:   04/08/18 1600  BP: (!) 153/68  Pulse: 61  Resp: 18  Temp: 97.7 F (36.5 C)  TempSrc: Oral  SpO2: 100%  Weight: 215 lb 3.2 oz (97.6 kg)   Ocular: Sclerae unicteric, pupils equal, round and reactive to light Ear-nose-throat: Oropharynx clear, slightly dry Lymphatic: No cervical or supraclavicular adenopathy Lungs no rales or rhonchi, good excursion bilaterally Heart regular rate and rhythm, no murmur appreciated Abd soft, obese, nontender, positive bowel sounds MSK no focal spinal tenderness, no joint edema Neuro: non-focal, well-oriented, positive affect Breasts: The right breast is imaged below.  There are no palpable masses in the breast itself.  Left breast is unremarkable.  Both axillae are benign.  Picture of her right breast taken on 04/07/2018     LAB RESULTS:  CMP     Component Value Date/Time   NA 138 04/08/2018 1549   K 4.2 04/08/2018 1549   CL 102 04/08/2018 1549   CO2 26 04/08/2018 1549   GLUCOSE 105 (H) 04/08/2018 1549   BUN 17 04/08/2018 1549   CREATININE 1.33 (H) 04/08/2018 1549   CALCIUM 9.4 04/08/2018 1549   PROT 7.6 04/08/2018 1549   ALBUMIN 3.8 04/08/2018 1549   AST 18 04/08/2018 1549   ALT 12 04/08/2018 1549   ALKPHOS 89 04/08/2018 1549   BILITOT 0.5 04/08/2018 1549   GFRNONAA 37 (L) 04/08/2018 1549   GFRAA 43 (L) 04/08/2018 1549    No results found for: KPAFRELGTCHN, LAMBDASER, KAPLAMBRATIO  No results found for: TOTALPROTELP, ALBUMINELP, A1GS, A2GS, BETS, BETA2SER, GAMS, MSPIKE, SPEI  Lab Results  Component Value Date   WBC 5.3 04/08/2018   NEUTROABS 2.7 04/08/2018   HGB 10.6 (L) 04/08/2018   HCT 34.0 (L) 04/08/2018   MCV 89.7 04/08/2018   PLT 240 04/08/2018    '@LASTCHEMISTRY'$ @  No results found for: LABCA2  No components found for: PHXTA569  No results for input(s): INR in the last 168 hours.  Urinalysis    Component Value Date/Time   COLORURINE  YELLOW 09/14/2015 1344   APPEARANCEUR CLEAR 09/14/2015 1344   LABSPEC 1.009 09/14/2015 1344   PHURINE 6.0 09/14/2015 1344   GLUCOSEU NEGATIVE 09/14/2015 1344   HGBUR TRACE (A) 09/14/2015 1344  BILIRUBINUR NEGATIVE 09/14/2015 1344   KETONESUR NEGATIVE 09/14/2015 1344   PROTEINUR NEGATIVE 09/14/2015 1344   UROBILINOGEN 0.2 06/21/2011 1204   NITRITE NEGATIVE 09/14/2015 1344   LEUKOCYTESUR NEGATIVE 09/14/2015 1344    RADIOLOGY AND OTHER STUDIES: US Breast Ltd Uni Right Inc Axilla  Result Date: 04/07/2018 CLINICAL DATA:  Patient has recently diagnosed Paget's disease of the right nipple, diagnosed with punch biopsy. Her right nipple is painful with intermittent bleeding. EXAM: DIGITAL DIAGNOSTIC BILATERAL MAMMOGRAM WITH CAD AND TOMO ULTRASOUND RIGHT BREAST COMPARISON:  Previous exam(s). ACR Breast Density Category b: There are scattered areas of fibroglandular density. FINDINGS: There is thickening of the areola and right nipple compared to prior studies and to the left side. There are no discrete breast masses, no areas of architectural distortion and no suspicious calcifications. Mammographic images were processed with CAD. On physical exam, right nipple is whitish in coloration, inflamed and friable. Areola is thickened with some scaling. Targeted ultrasound is performed, showing no mass in the retroareolar right breast. There are no dilated ducts or intraductal lesions. No enlarged or abnormal lymph nodes noted in the right axilla. IMPRESSION: 1. Recently diagnosed Paget's disease of the right nipple. 2. No evidence of malignancy beyond the right nipple and areola. No evidence of metastatic adenopathy. 3. Normal appearance of the left breast. RECOMMENDATION: Treatment as planned for the known right nipple Paget's disease. I have discussed the findings and recommendations with the patient. Results were also provided in writing at the conclusion of the visit. If applicable, a reminder letter will be  sent to the patient regarding the next appointment. BI-RADS CATEGORY  6: Known biopsy-proven malignancy. Electronically Signed   By: Lajean Manes M.D.   On: 04/07/2018 14:11   Mm Diag Breast Tomo Bilateral  Result Date: 04/07/2018 CLINICAL DATA:  Patient has recently diagnosed Paget's disease of the right nipple, diagnosed with punch biopsy. Her right nipple is painful with intermittent bleeding. EXAM: DIGITAL DIAGNOSTIC BILATERAL MAMMOGRAM WITH CAD AND TOMO ULTRASOUND RIGHT BREAST COMPARISON:  Previous exam(s). ACR Breast Density Category b: There are scattered areas of fibroglandular density. FINDINGS: There is thickening of the areola and right nipple compared to prior studies and to the left side. There are no discrete breast masses, no areas of architectural distortion and no suspicious calcifications. Mammographic images were processed with CAD. On physical exam, right nipple is whitish in coloration, inflamed and friable. Areola is thickened with some scaling. Targeted ultrasound is performed, showing no mass in the retroareolar right breast. There are no dilated ducts or intraductal lesions. No enlarged or abnormal lymph nodes noted in the right axilla. IMPRESSION: 1. Recently diagnosed Paget's disease of the right nipple. 2. No evidence of malignancy beyond the right nipple and areola. No evidence of metastatic adenopathy. 3. Normal appearance of the left breast. RECOMMENDATION: Treatment as planned for the known right nipple Paget's disease. I have discussed the findings and recommendations with the patient. Results were also provided in writing at the conclusion of the visit. If applicable, a reminder letter will be sent to the patient regarding the next appointment. BI-RADS CATEGORY  6: Known biopsy-proven malignancy. Electronically Signed   By: Lajean Manes M.D.   On: 04/07/2018 14:11      ASSESSMENT: 82 y.o. [place] woman status post right nipple biopsy 03/07/2018, showing Paget's disease  of the breast, estrogen and progesterone receptor negative, HER-2 amplified  (1) genetics testing pending  (2) definitive surgery pending  (3) adjuvant radiation as appropriate  (4)  consider anti-HER-2 immunotherapy if invasive disease uncovered  PLAN: I spent approximately 60 minutes face to face with Katy Apo with more than 50% of that time spent in counseling and coordination of care. We first reviewed the fact that cancer is not one disease but more than 100 different diseases and that it is important to keep them separate-- otherwise when friends and relatives discuss their own cancer experiences with [place] confusion can result. Similarly we explained that if breast cancer spreads to the bone or liver, the patient would not have bone cancer or liver cancer, but breast cancer in the bone and breast cancer in the liver: one cancer in three places-- not 3 different cancers which otherwise would have to be treated in 3 different ways.  She understands that Paget's disease is a form of breast cancer and one localized it is treated as noninvasive breast cancer however in the majority of cases there is an underlying tumor in the breast, which itself may be invasive or noninvasive.  It is very favorable that her mammography and ultrasonography has been negative but that does not mean there is now disease in the breast.  It just means that if it is there it was not imaged by those modalities.  For that reason she is being scheduled for a breast MRI, to be done ASAP.  We discussed the difference between local and systemic therapy.  As far as local therapy is concerned, the options include lumpectomy followed by radiation, or mastectomy, likely without radiation.  She has been scheduled to meet with our radiation team to discuss this further.  We then discussed the options for systemic therapy.  We generally prefer to avoid chemotherapy in older women in any case.  There is no role for chemotherapy in  noninvasive disease.  If we do find invasive disease which is estrogen receptor and progesterone receptor negative, but HER-2 amplified as the Paget's is, we can consider anti-HER-2 immunotherapy such as a brief course of T-DM 1.  Lilly does qualify for genetics testing. In patients who carry a deleterious mutation [for example in a  BRCA gene], the risk of a new breast cancer developing in the future may be sufficiently great that the patient may choose bilateral mastectomies. Taking the patient's advanced age however this would be an unlikely choice.  In any case if she wishes to keep her breasts in face of a deleterious mutation it is safe to do so. That would all 4 intensified screening, which generally means not only yearly mammography but a yearly breast MRI as well.   At this point then the plan is for breast MRI, genetics testing, likely discussion of the case at the next multidisciplinary breast cancer meeting 04/16/2018, at which point hopefully Dr. Ninfa Linden can guide [place] to the best surgical option, to be followed or not by radiation or other treatments  The patient has a good understanding of the overall plan. She agrees with it. She knows the goal of treatment in her case is cure. She will call with any problems that may develop before her next visit here.  Chauncey Cruel, MD   04/08/2018 6:48 PM Medical Oncology and Hematology [facility] [address] [place], [place] [address] Tel. [phone]    Fax. [phone]  Dierdre Searles Dweik am acting as scribe for Dr. Virgie Dad Buffi Ewton.  I, Lurline Del MD, have reviewed the above documentation for accuracy and completeness, and I agree with the above.

## 2018-04-08 ENCOUNTER — Telehealth: Payer: Self-pay | Admitting: *Deleted

## 2018-04-08 ENCOUNTER — Ambulatory Visit: Payer: Medicare Other | Admitting: Cardiology

## 2018-04-08 ENCOUNTER — Encounter: Payer: Self-pay | Admitting: Cardiology

## 2018-04-08 ENCOUNTER — Inpatient Hospital Stay: Payer: Medicare Other

## 2018-04-08 ENCOUNTER — Inpatient Hospital Stay: Payer: Medicare Other | Attending: Oncology | Admitting: Oncology

## 2018-04-08 VITALS — BP 153/68 | HR 61 | Temp 97.7°F | Resp 18 | Wt 215.2 lb

## 2018-04-08 VITALS — BP 140/74 | HR 61 | Ht 61.0 in | Wt 214.4 lb

## 2018-04-08 DIAGNOSIS — Z794 Long term (current) use of insulin: Secondary | ICD-10-CM | POA: Insufficient documentation

## 2018-04-08 DIAGNOSIS — C50011 Malignant neoplasm of nipple and areola, right female breast: Secondary | ICD-10-CM | POA: Insufficient documentation

## 2018-04-08 DIAGNOSIS — I10 Essential (primary) hypertension: Secondary | ICD-10-CM | POA: Insufficient documentation

## 2018-04-08 DIAGNOSIS — I6523 Occlusion and stenosis of bilateral carotid arteries: Secondary | ICD-10-CM

## 2018-04-08 DIAGNOSIS — I272 Pulmonary hypertension, unspecified: Secondary | ICD-10-CM

## 2018-04-08 DIAGNOSIS — Z171 Estrogen receptor negative status [ER-]: Secondary | ICD-10-CM | POA: Insufficient documentation

## 2018-04-08 DIAGNOSIS — G4733 Obstructive sleep apnea (adult) (pediatric): Secondary | ICD-10-CM

## 2018-04-08 DIAGNOSIS — Z7982 Long term (current) use of aspirin: Secondary | ICD-10-CM | POA: Insufficient documentation

## 2018-04-08 DIAGNOSIS — Z79899 Other long term (current) drug therapy: Secondary | ICD-10-CM | POA: Diagnosis not present

## 2018-04-08 DIAGNOSIS — Z90710 Acquired absence of both cervix and uterus: Secondary | ICD-10-CM | POA: Diagnosis not present

## 2018-04-08 DIAGNOSIS — E109 Type 1 diabetes mellitus without complications: Secondary | ICD-10-CM | POA: Diagnosis not present

## 2018-04-08 DIAGNOSIS — C50019 Malignant neoplasm of nipple and areola, unspecified female breast: Secondary | ICD-10-CM

## 2018-04-08 DIAGNOSIS — Z6841 Body Mass Index (BMI) 40.0 and over, adult: Secondary | ICD-10-CM

## 2018-04-08 LAB — CMP (CANCER CENTER ONLY)
ALBUMIN: 3.8 g/dL (ref 3.5–5.0)
ALT: 12 U/L (ref 0–44)
AST: 18 U/L (ref 15–41)
Alkaline Phosphatase: 89 U/L (ref 38–126)
Anion gap: 10 (ref 5–15)
BUN: 17 mg/dL (ref 8–23)
CHLORIDE: 102 mmol/L (ref 98–111)
CO2: 26 mmol/L (ref 22–32)
Calcium: 9.4 mg/dL (ref 8.9–10.3)
Creatinine: 1.33 mg/dL — ABNORMAL HIGH (ref 0.44–1.00)
GFR, Est AFR Am: 43 mL/min — ABNORMAL LOW (ref >60)
GFR, Estimated: 37 mL/min — ABNORMAL LOW (ref >60)
GLUCOSE: 105 mg/dL — AB (ref 70–99)
Potassium: 4.2 mmol/L (ref 3.5–5.1)
Sodium: 138 mmol/L (ref 135–145)
Total Bilirubin: 0.5 mg/dL (ref 0.3–1.2)
Total Protein: 7.6 g/dL (ref 6.5–8.1)

## 2018-04-08 LAB — CBC WITH DIFFERENTIAL (CANCER CENTER ONLY)
ABS IMMATURE GRANULOCYTES: 0.03 10*3/uL (ref 0.00–0.07)
Basophils Absolute: 0 10*3/uL (ref 0.0–0.1)
Basophils Relative: 1 %
Eosinophils Absolute: 0.1 10*3/uL (ref 0.0–0.5)
Eosinophils Relative: 2 %
HCT: 34 % — ABNORMAL LOW (ref 36.0–46.0)
HEMOGLOBIN: 10.6 g/dL — AB (ref 12.0–15.0)
Immature Granulocytes: 1 %
LYMPHS ABS: 1.8 10*3/uL (ref 0.7–4.0)
LYMPHS PCT: 35 %
MCH: 28 pg (ref 26.0–34.0)
MCHC: 31.2 g/dL (ref 30.0–36.0)
MCV: 89.7 fL (ref 80.0–100.0)
MONO ABS: 0.6 10*3/uL (ref 0.1–1.0)
MONOS PCT: 12 %
NEUTROS ABS: 2.7 10*3/uL (ref 1.7–7.7)
Neutrophils Relative %: 49 %
Platelet Count: 240 10*3/uL (ref 150–400)
RBC: 3.79 MIL/uL — AB (ref 3.87–5.11)
RDW: 16 % — ABNORMAL HIGH (ref 11.5–15.5)
WBC Count: 5.3 10*3/uL (ref 4.0–10.5)
nRBC: 0 % (ref 0.0–0.2)

## 2018-04-08 NOTE — Telephone Encounter (Signed)
-----   Message from Sarina Ill, RN sent at 04/08/2018  9:25 AM EST ----- Regarding: Sleep Dr. Radford Pax ordered Overnight pulse oximetry and new CPAP Resmed with heated humidity. Orders placed please schedule. Needs 10 week appt. Thanks, [employer]

## 2018-04-08 NOTE — Telephone Encounter (Signed)
Order faxed to [facility] and sent via community message for Resmed CPAP with heated humidity and mask of choice set at 10cm H2O as well as supplies. And ONO on CPAP  Upon patient request DME selection is [facility] Patient understands she will be contacted by [facility] to set up her cpap. Patient understands to call if [facility] does not contact her with new setup in a timely manner. Patient understands they will be called once confirmation has been received from [facility] that they have received their new machine to schedule 10 week follow up appointment.  [facility] notified of new cpap order  Please add to airview Patient was grateful for the call and thanked me.

## 2018-04-08 NOTE — Patient Instructions (Signed)
Medication Instructions:  Your physician recommends that you continue on your current medications as directed. Please refer to the Current Medication list given to you today.  If you need a refill on your cardiac medications before your next appointment, please call your pharmacy.   Lab work:  If you have labs (blood work) drawn today and your tests are completely normal, you will receive your results only by: Marland Kitchen MyChart Message (if you have MyChart) OR . A paper copy in the mail If you have any lab test that is abnormal or we need to change your treatment, we will call you to review the results.  Testing/Procedures: Your physician has requested that you have an echocardiogram. Echocardiography is a painless test that uses sound waves to create images of your heart. It provides your doctor with information about the size and shape of your heart and how well your heart's chambers and valves are working. This procedure takes approximately one hour. There are no restrictions for this procedure.  Your physician has requested that you have a carotid duplex. This test is an ultrasound of the carotid arteries in your neck. It looks at blood flow through these arteries that supply the brain with blood. Allow one hour for this exam. There are no restrictions or special instructions.  CPAP and Overnight pulse Oximetry   Follow-Up: 10 weeks after CPAP is received, Gae Bon our sleep coordinator will contact you to schedule.

## 2018-04-09 ENCOUNTER — Telehealth: Payer: Self-pay | Admitting: Oncology

## 2018-04-09 NOTE — Progress Notes (Signed)
Location of Breast Cancer: Paget's disease of right nipple.   Histology per Pathology Report:  Dr. [name] (Dermatology) did a shave biopsy of the right nipple showing ([id number] [phone]) Paget's disease of the nipple, estrogen and progesterone receptor negative, but HER-2 amplified by immunohistochemistry (3+).  Receptor Status: ER(NEG), PR (NEG)  Did patient present with symptoms or was this found on screening mammography?: She observed discoloration to her right nipple starting one year ago.   Past/Anticipated interventions by surgeon, if any: Dr. Ninfa Linden saw her 04/01/18  Past/Anticipated interventions by medical oncology, if any:  Dr. Jana Hakim 04/08/18 At this point then the plan is for breast MRI, genetics testing, likely discussion of the case at the next multidisciplinary breast cancer meeting 04/16/2018, at which point hopefully Dr. Ninfa Linden can guide [place] to the best surgical option, to be followed or not by radiation or other treatments  Lymphedema issues, if any:  N/A  Pain issues, if any:  She reports chronic knee pain.   SAFETY ISSUES:  Prior radiation? No  Pacemaker/ICD? No  Possible current pregnancy? No  Is the patient on methotrexate? No  Current Complaints / other details:    BP (!) 158/59   Pulse 60   Temp 98.1 F (36.7 C) (Oral)   Ht 5' 1.5" (1.562 m)   Wt 209 lb 12.8 oz (95.2 kg)   SpO2 100% Comment: room air  BMI 39.00 kg/m    Wt Readings from Last 3 Encounters:  04/15/18 209 lb 12.8 oz (95.2 kg)  04/11/18 197 lb 3.2 oz (89.4 kg)  04/08/18 215 lb 3.2 oz (97.6 kg)      Ellenore Roscoe, Stephani Police, RN 04/09/2018,1:23 PM

## 2018-04-09 NOTE — Telephone Encounter (Signed)
No los per 11/5. °

## 2018-04-09 NOTE — Telephone Encounter (Signed)
Left message for patient per 11/5 sch message - sent reminder letter in the mail with appt date and time.

## 2018-04-11 ENCOUNTER — Ambulatory Visit (HOSPITAL_COMMUNITY)
Admission: RE | Admit: 2018-04-11 | Discharge: 2018-04-11 | Disposition: A | Discharge status: Home / Self Care | Payer: Medicare Other | Source: Ambulatory Visit | Attending: Internal Medicine | Admitting: Internal Medicine

## 2018-04-11 ENCOUNTER — Ambulatory Visit
Admission: RE | Admit: 2018-04-11 | Discharge: 2018-04-11 | Disposition: A | Discharge status: Home / Self Care | Payer: Medicare Other | Source: Ambulatory Visit | Attending: Oncology | Admitting: Oncology

## 2018-04-11 VITALS — BP 131/49 | HR 62 | Wt 197.2 lb

## 2018-04-11 DIAGNOSIS — Z7982 Long term (current) use of aspirin: Secondary | ICD-10-CM | POA: Insufficient documentation

## 2018-04-11 DIAGNOSIS — E785 Hyperlipidemia, unspecified: Secondary | ICD-10-CM | POA: Insufficient documentation

## 2018-04-11 DIAGNOSIS — I130 Hypertensive heart and chronic kidney disease with heart failure and stage 1 through stage 4 chronic kidney disease, or unspecified chronic kidney disease: Secondary | ICD-10-CM | POA: Insufficient documentation

## 2018-04-11 DIAGNOSIS — M1990 Unspecified osteoarthritis, unspecified site: Secondary | ICD-10-CM | POA: Diagnosis not present

## 2018-04-11 DIAGNOSIS — N183 Chronic kidney disease, stage 3 (moderate): Secondary | ICD-10-CM | POA: Insufficient documentation

## 2018-04-11 DIAGNOSIS — E1122 Type 2 diabetes mellitus with diabetic chronic kidney disease: Secondary | ICD-10-CM | POA: Insufficient documentation

## 2018-04-11 DIAGNOSIS — E559 Vitamin D deficiency, unspecified: Secondary | ICD-10-CM | POA: Insufficient documentation

## 2018-04-11 DIAGNOSIS — Z6841 Body Mass Index (BMI) 40.0 and over, adult: Principal | ICD-10-CM

## 2018-04-11 DIAGNOSIS — Z886 Allergy status to analgesic agent status: Secondary | ICD-10-CM | POA: Diagnosis not present

## 2018-04-11 DIAGNOSIS — Z885 Allergy status to narcotic agent status: Secondary | ICD-10-CM | POA: Insufficient documentation

## 2018-04-11 DIAGNOSIS — K219 Gastro-esophageal reflux disease without esophagitis: Secondary | ICD-10-CM | POA: Diagnosis not present

## 2018-04-11 DIAGNOSIS — I272 Pulmonary hypertension, unspecified: Secondary | ICD-10-CM | POA: Diagnosis not present

## 2018-04-11 DIAGNOSIS — R011 Cardiac murmur, unspecified: Secondary | ICD-10-CM | POA: Diagnosis not present

## 2018-04-11 DIAGNOSIS — C50011 Malignant neoplasm of nipple and areola, right female breast: Secondary | ICD-10-CM

## 2018-04-11 DIAGNOSIS — I5032 Chronic diastolic (congestive) heart failure: Secondary | ICD-10-CM | POA: Insufficient documentation

## 2018-04-11 DIAGNOSIS — J9611 Chronic respiratory failure with hypoxia: Secondary | ICD-10-CM | POA: Diagnosis not present

## 2018-04-11 DIAGNOSIS — G4733 Obstructive sleep apnea (adult) (pediatric): Secondary | ICD-10-CM | POA: Insufficient documentation

## 2018-04-11 DIAGNOSIS — Z79899 Other long term (current) drug therapy: Secondary | ICD-10-CM | POA: Diagnosis not present

## 2018-04-11 DIAGNOSIS — C50911 Malignant neoplasm of unspecified site of right female breast: Secondary | ICD-10-CM | POA: Insufficient documentation

## 2018-04-11 DIAGNOSIS — Z171 Estrogen receptor negative status [ER-]: Secondary | ICD-10-CM

## 2018-04-11 MED ORDER — GADOBUTROL 1 MMOL/ML IV SOLN
8.0000 mL | Freq: Once | INTRAVENOUS | Status: AC | PRN
  Administered 2018-04-11: 8 mL via INTRAVENOUS

## 2018-04-11 NOTE — Patient Instructions (Signed)
We will contact you in 6 months to schedule your next appointment.  

## 2018-04-11 NOTE — Progress Notes (Signed)
ADVANCED HF CLINIC CONSULT NOTE   Primary Cardiologist: Radford Pax  HPI:  [name] is a 82 y.o. female with a history of HTN, DM2, CKD, diastolic HF, OSA, obesity and moderate pulmonary HTN. She denies any h/o known CAD or other heart disease. Non smoker.   She was referred by Dr. Radford Pax for evaluation of [place].   Today she returns for [facility] follow up.   Saw Dr. Radford Pax earlier this week. And having CPAP updated. She is using it well and getting good results. Also echo ordered to f/u on [place]. Says she is doing alright. Gets around and does all ADLs. Walks with a cane for balance. Completed Pulmonary Rehab and was in maintenance program but now not going.Off O2. Goes to store without problem. No edema, orthopnea or PND. Still driving. Retired from [employer] with [employer]. Recently diagnosed with R breast cancer and following with Dr. Saunders Revel Dr. [place] Bells this Fall. Doing well. He ordered  Hi-res Chest CT. No ILD.   ECHO 12/18 LVEF 02-77% Grade II diastolic dysfunction RV ok RVSP 61mHG  ECHO 09/20/16 LVEF 641-28%Grade II diastolic dysfunction RV ok RVSP 59   PFTs 11/01/2016  FEV1 0.93 (67%) FVC  1.36 (75%)  DLCO 55%   Past Medical History:  Diagnosis Date  . Anemia    takes iron  . Arthritis    Dr LRonnie Derby . Carotid artery stenosis    1-39% bilateral dopplers 08/2015  . Cataracts, bilateral   . Chronic kidney disease    stage III Dr MMercy Moore . Diabetes mellitus   . GERD (gastroesophageal reflux disease)   . Heart murmur    Dr VIrish Lack . HTN (hypertension)   . Hyperlipidemia   . Morbid obesity with BMI of 40.0-44.9, adult ([place]   . Obstructive sleep apnea (adult) (pediatric)    w CPAP Severe OSA AHI 52/hr CPAP at 10cm H2O  . Pulmonary HTN ([place]    mild with PASP 425mg echo 02/2014  . Right carotid bruit 08/19/2014  . Shortness of breath    resolved with CPAP  . Urination frequency   . Vitamin D deficiency disease   . Wears dentures    upper  . Wears glasses      Current Outpatient Medications  Medication Sig Dispense Refill  . acetaminophen (TYLENOL) 500 MG tablet Take 500 mg by mouth every 8 (eight) hours as needed for mild pain, moderate pain, fever or headache.     . Marland KitchenmLODipine (NORVASC) 10 MG tablet Take 10 mg by mouth daily.  6  . aspirin EC 81 MG tablet Take 81 mg by mouth daily.    . Marland Kitchentorvastatin (LIPITOR) 40 MG tablet Take 40 mg by mouth at bedtime.    . Cholecalciferol (VITAMIN D) 2000 UNITS CAPS Take 2,000 Units by mouth daily.    . Marland Kitchenocusate sodium (COLACE) 100 MG capsule Take 100 mg by mouth daily as needed for mild constipation.    . Marland Kitchensomeprazole (NEXIUM) 40 MG capsule Take 40 mg by mouth daily.     . Marland Kitchenzetimibe (ZETIA) 10 MG tablet Take 10 mg by mouth at bedtime.    . furosemide (LASIX) 40 MG tablet Take 40 mg by mouth daily.     . Multiple Vitamins-Minerals (MULTIVITAMINS THER. W/MINERALS) TABS Take 1 tablet by mouth daily.    . pioglitazone (ACTOS) 30 MG tablet Take 30 mg by mouth daily.    . Marland Kitchenpironolactone (ALDACTONE) 25 MG tablet Take 12.5 mg by mouth  daily.     No current facility-administered medications for this encounter.     Allergies  Allergen Reactions  . Codeine Itching  . Ibuprofen Other (See Comments)    "gave her kidney trouble"      Social History   Socioeconomic History  . Marital status: Widowed    Spouse name: Not on file  . Number of children: Not on file  . Years of education: Not on file  . Highest education level: Not on file  Occupational History  . Not on file  Social Needs  . Financial resource strain: Not on file  . Food insecurity:    Worry: Not on file    Inability: Not on file  . Transportation needs:    Medical: Not on file    Non-medical: Not on file  Tobacco Use  . Smoking status: Never Smoker  . Smokeless tobacco: Never Used  Substance and Sexual Activity  . Alcohol use: No  . Drug use: No  . Sexual activity: Never  Lifestyle  . Physical activity:    Days per week: Not  on file    Minutes per session: Not on file  . Stress: Not on file  Relationships  . Social connections:    Talks on phone: Not on file    Gets together: Not on file    Attends religious service: Not on file    Active member of club or organization: Not on file    Attends meetings of clubs or organizations: Not on file    Relationship status: Not on file  . Intimate partner violence:    Fear of current or ex partner: Not on file    Emotionally abused: Not on file    Physically abused: Not on file    Forced sexual activity: Not on file  Other Topics Concern  . Not on file  Social History Narrative  . Not on file      Family History  Problem Relation Age of Onset  . Diabetes Mother   . Diabetes Father     Vitals:   04/11/18 1032  BP: (!) 131/49  Pulse: 62  SpO2: 100%  Weight: 89.4 kg (197 lb 3.2 oz)      PHYSICAL EXAM: General:  Well appearing. No resp difficulty HEENT: normal Neck: supple. no JVD. Carotids 2+ bilat; no bruits. No lymphadenopathy or thryomegaly appreciated. Cor: PMI nondisplaced. Regular rate & rhythm. 2/6 TR Lungs: clear Abdomen: obese soft, nontender, nondistended. No hepatosplenomegaly. No bruits or masses. Good bowel sounds. Extremities: no cyanosis, clubbing, rash, edema Neuro: alert & orientedx3, cranial nerves grossly intact. moves all 4 extremities w/o difficulty. Affect pleasant   ASSESSMENT & PLAN:  1. Pulm HTN   - Likely mild to moderate pulmonary HTN due to [facility] II (diastolic HF) and III (OSA/OHS/hypoxia) PH. No role for selective pulmonary vasodilators at this point. - Remains stable NYHA II. Echo from 12/18 withno evidence of RV strain. RVSP estimated at 63mHG which is stable.  - Dr. TRadford Paxordered repeat echo  - Functional status  improved with pulmonary rehab. Now off O2. Encouraged her to return with PR Maintenance program - PFTs reviewed personally and shows mixed obstructive/restrictive picture. Hi-res CT - No ILD -  Continue CPAP - Continue lasix 40 mg daily - Continue spiro 25 mg daily.  - Will await results of echo. If stable can f/u with [place]/   2. Chronic hypoxic respiratory failure - multifactorial. PFTs reviewed personally. Mixed  obstructive/restrictive picture - improved with pulmonary rehab. Now off O2 - Has seen  Dr. Lake Bells - Hi-res chest CT - no ILD  3. OSA  - continue CPAP  4. Breast cancer - follows with Dr. Jana Hakim. If HER-2 + will need to follow up with [place] in [place], MD  11:05 AM

## 2018-04-11 NOTE — Addendum Note (Signed)
Encounter addended by: Scarlette Calico, RN on: 04/11/2018 11:17 AM  Actions taken: Sign clinical note

## 2018-04-13 ENCOUNTER — Other Ambulatory Visit: Payer: Self-pay | Admitting: Oncology

## 2018-04-15 ENCOUNTER — Ambulatory Visit
Admission: RE | Admit: 2018-04-15 | Discharge: 2018-04-15 | Disposition: A | Discharge status: Home / Self Care | Payer: Medicare Other | Source: Ambulatory Visit | Attending: Radiation Oncology | Admitting: Radiation Oncology

## 2018-04-15 ENCOUNTER — Other Ambulatory Visit: Payer: Self-pay

## 2018-04-15 ENCOUNTER — Encounter: Payer: Self-pay | Admitting: Radiation Oncology

## 2018-04-15 DIAGNOSIS — C50011 Malignant neoplasm of nipple and areola, right female breast: Secondary | ICD-10-CM

## 2018-04-15 DIAGNOSIS — Z171 Estrogen receptor negative status [ER-]: Secondary | ICD-10-CM | POA: Insufficient documentation

## 2018-04-15 DIAGNOSIS — Z79899 Other long term (current) drug therapy: Secondary | ICD-10-CM | POA: Insufficient documentation

## 2018-04-15 DIAGNOSIS — Z885 Allergy status to narcotic agent status: Secondary | ICD-10-CM | POA: Insufficient documentation

## 2018-04-15 DIAGNOSIS — Z7982 Long term (current) use of aspirin: Secondary | ICD-10-CM | POA: Insufficient documentation

## 2018-04-15 DIAGNOSIS — Z886 Allergy status to analgesic agent status: Secondary | ICD-10-CM | POA: Diagnosis not present

## 2018-04-15 NOTE — Progress Notes (Signed)
Radiation Oncology         [phone] ________________________________  Initial Outpatient Consultation  Name: [name] MRN: [id number]  Date: 04/15/2018  DOB: 08/26/1935  CC:Leighton Ruff, MD  Coralie Keens, MD   REFERRING PHYSICIAN: Coralie Keens, MD  DIAGNOSIS:    ICD-10-CM   1. Malignant neoplasm involving both nipple and areola of right breast in female, estrogen receptor negative ([place]) C50.011    Z17.1    Paget's disease of the right nipple, ER(-) / PR(-) / Her2(+) Stage 0 [id number]  CHIEF COMPLAINT: Here to discuss management of Paget's disease of the right nipple  HISTORY OF PRESENT ILLNESS::[name], pronounced "Hay-good", is an 82 y.o. female who presented to her PCP complaining of discoloration of the right nipple with intermittent chronic scabbing that started two years ago. She started noticing bleeding from the nipple and was evaluated with diagnostic mammogram and ultrasound on 01/04/17 which showed no mammographic or sonographic evidence of malignancy. However, this problem persisted and the patient was subsequently referred to dermatology. Dr. [name] did a shave biopsy of the right nipple on 03/07/18 which revealed features consistent with Paget's disease of the nipple; ER negative, PR negative, Her2 (3+) positive.  The patient was then referred to Dr. Ninfa Linden who set her up for repeat mammogram and ultrasound, performed on 04/07/18. This demonstrated the recently diagnosed Paget's disease of the right nipple. No evidence of malignancy beyond the right nipple and areola. No evidence of metastatic adenopathy. Normal appearance of the left breast.   She then saw Dr. Jana Hakim on 04/08/18. When the patient saw Dr. Jana Hakim, he did not recommend chemotherapy, due to the risks that it would impose for someone of her age, and in part due to the fact that clinically she had early stage disease.  Bilateral breast MRI on 04/11/18 showed abnormal skin  thickening and enhancement involving the right nipple and areola, consistent with biopsy-proven Paget's disease. No additional suspicious MRI findings are identified within the remainder of the right breast. No suspicious right axillary lymphadenopathy. No MRI evidence of malignancy on the left.   She has genetic counseling scheduled for 04/28/18.  On review of systems, the patient is positive for occasional constipation, urinary urgency, and occasional pain in nipple.  PREVIOUS RADIATION THERAPY: No  PAST MEDICAL HISTORY:  has a past medical history of Anemia, Arthritis, Carotid artery stenosis, Cataracts, bilateral, Chronic kidney disease, Diabetes mellitus, GERD (gastroesophageal reflux disease), Heart murmur, HTN (hypertension), Hyperlipidemia, Morbid obesity with BMI of 40.0-44.9, adult ([place]), Obstructive sleep apnea (adult) (pediatric), Pulmonary HTN ([place]), Right carotid bruit (08/19/2014), Shortness of breath, Urination frequency, Vitamin D deficiency disease, Wears dentures, and Wears glasses.    PAST SURGICAL HISTORY: Past Surgical History:  Procedure Laterality Date  . ABDOMINAL HYSTERECTOMY     partial  . APPENDECTOMY    . CARDIOVASCULAR STRESS TEST  2011  . CHOLECYSTECTOMY N/A 10/04/2015   Procedure: LAPAROSCOPIC CHOLECYSTECTOMY;  Surgeon: Coralie Keens, MD;  Location: [facility];  Service: General;  Laterality: N/A;  . COLONOSCOPY  01/15/2012   Procedure: COLONOSCOPY;  Surgeon: Lear Ng, MD;  Location: [facility];  Service: Endoscopy;  Laterality: N/A;  . ESOPHAGOGASTRODUODENOSCOPY  01/15/2012   Procedure: ESOPHAGOGASTRODUODENOSCOPY (EGD);  Surgeon: Lear Ng, MD;  Location: [facility];  Service: Endoscopy;  Laterality: N/A;  . JOINT REPLACEMENT     bilateral  . left shoulder surgery      rotator cuff tear   . sleep study  2012  . TOTAL  KNEE ARTHROPLASTY     Left  . TOTAL KNEE ARTHROPLASTY  07/02/2011   Procedure: TOTAL KNEE ARTHROPLASTY;  Surgeon:  Rudean Haskell, MD;  Location: [facility];  Service: Orthopedics;  Laterality: Right;  . US ECHOCARDIOGRAPHY     2012    FAMILY HISTORY: family history includes Diabetes in her father and mother.  SOCIAL HISTORY:  reports that she has never smoked. She has never used smokeless tobacco. She reports that she does not drink alcohol or use drugs.  ALLERGIES: Codeine and Ibuprofen  MEDICATIONS:  Current Outpatient Medications  Medication Sig Dispense Refill  . acetaminophen (TYLENOL) 500 MG tablet Take 500 mg by mouth every 8 (eight) hours as needed for mild pain, moderate pain, fever or headache.     Marland Kitchen amLODipine (NORVASC) 10 MG tablet Take 10 mg by mouth daily.  6  . aspirin EC 81 MG tablet Take 81 mg by mouth daily.    Marland Kitchen atorvastatin (LIPITOR) 40 MG tablet Take 40 mg by mouth at bedtime.    . Cholecalciferol (VITAMIN D) 2000 UNITS CAPS Take 2,000 Units by mouth daily.    Marland Kitchen docusate sodium (COLACE) 100 MG capsule Take 100 mg by mouth daily as needed for mild constipation.    Marland Kitchen esomeprazole (NEXIUM) 40 MG capsule Take 40 mg by mouth daily.     Marland Kitchen ezetimibe (ZETIA) 10 MG tablet Take 10 mg by mouth at bedtime.    . furosemide (LASIX) 40 MG tablet Take 40 mg by mouth daily.     . Multiple Vitamins-Minerals (MULTIVITAMINS THER. W/MINERALS) TABS Take 1 tablet by mouth daily.    . pioglitazone (ACTOS) 30 MG tablet Take 30 mg by mouth daily.    Marland Kitchen spironolactone (ALDACTONE) 25 MG tablet Take 12.5 mg by mouth daily.     No current facility-administered medications for this encounter.     REVIEW OF SYSTEMS: A 10+ POINT REVIEW OF SYSTEMS WAS OBTAINED including neurology, dermatology, psychiatry, cardiac, respiratory, lymph, extremities, GI, GU, Musculoskeletal, constitutional, breasts, reproductive, HEENT.  All pertinent positives are noted in the HPI.  All others are negative.   PHYSICAL EXAM:  height is 5' 1.5" (1.562 m) and weight is 209 lb 12.8 oz (95.2 kg). Her oral temperature is 98.1 F (36.7 C).  Her blood pressure is 158/59 (abnormal) and her pulse is 60. Her oxygen saturation is 100%.   General: Alert and oriented, in no acute distress. HEENT: Head is normocephalic. Extraocular movements are intact. Oropharynx is clear. Neck: Neck is supple, no palpable cervical or supraclavicular lymphadenopathy. Heart: Regular in rate and rhythm. She has a loud systolic murmur heard throughout the precordium. Chest: Clear to auscultation bilaterally, with no rhonchi, wheezes, or rales. Abdomen: Soft, nontender, nondistended, with no rigidity or guarding. Extremities: No cyanosis or edema. Lymphatics: see Neck Exam Skin: No concerning lesions. Musculoskeletal: Symmetric strength and muscle tone throughout. Neurologic: Cranial nerves II through XII are grossly intact. No obvious focalities. Speech is fluent. Coordination is intact. Psychiatric: Judgment and insight are intact. Affect is appropriate. Breasts: The right nipple is erythematous and moistly desquamated with a dark hyperpigmented rim between nipple and the remaining areola. Otherwise no palpable masses appreciated in the breasts or axillae bilaterally.   ECOG = 1  0 - Asymptomatic (Fully active, able to carry on all predisease activities without restriction)  1 - Symptomatic but completely ambulatory (Restricted in physically strenuous activity but ambulatory and able to carry out work of a light or sedentary nature. For example,  light housework, office work)  2 - Symptomatic, <50% in bed during the day (Ambulatory and capable of all self care but unable to carry out any work activities. Up and about more than 50% of waking hours)  3 - Symptomatic, >50% in bed, but not bedbound (Capable of only limited self-care, confined to bed or chair 50% or more of waking hours)  4 - Bedbound (Completely disabled. Cannot carry on any self-care. Totally confined to bed or chair)  5 - Death   Eustace Pen MM, Creech RH, Tormey DC, et al. [phone]). "Toxicity  and response criteria of the [facility]". [place] Oncol. 5 (6): 649-55   LABORATORY DATA:  Lab Results  Component Value Date   WBC 5.3 04/08/2018   HGB 10.6 (L) 04/08/2018   HCT 34.0 (L) 04/08/2018   MCV 89.7 04/08/2018   PLT 240 04/08/2018   CMP     Component Value Date/Time   NA 138 04/08/2018 1549   K 4.2 04/08/2018 1549   CL 102 04/08/2018 1549   CO2 26 04/08/2018 1549   GLUCOSE 105 (H) 04/08/2018 1549   BUN 17 04/08/2018 1549   CREATININE 1.33 (H) 04/08/2018 1549   CALCIUM 9.4 04/08/2018 1549   PROT 7.6 04/08/2018 1549   ALBUMIN 3.8 04/08/2018 1549   AST 18 04/08/2018 1549   ALT 12 04/08/2018 1549   ALKPHOS 89 04/08/2018 1549   BILITOT 0.5 04/08/2018 1549   GFRNONAA 37 (L) 04/08/2018 1549   GFRAA 43 (L) 04/08/2018 1549         RADIOGRAPHY: Mr Breast Bilateral W Wo Contrast Inc Cad  Result Date: 04/11/2018 CLINICAL DATA:  82 year old female with biopsy proven Paget's disease of the right nipple. LABS:  Creatinine was obtained on site at [facility] at [address] Results: Creatinine 1.4 mg/dL. EXAM: BILATERAL BREAST MRI WITH AND WITHOUT CONTRAST TECHNIQUE: Multiplanar, multisequence MR images of both breasts were obtained prior to and following the intravenous administration of 8 ml of Gadavist Three-dimensional MR images were rendered by post-processing of the original MR data on an independent workstation. The three-dimensional MR images were interpreted, and findings are reported in the following complete MRI report for this study. Three dimensional images were evaluated at the independent DynaCad workstation COMPARISON:  Previous exam(s). FINDINGS: Breast composition: b. Scattered fibroglandular tissue. Background parenchymal enhancement: Minimal Right breast: There is abnormal skin thickening and enhancement involving the right nipple and areola, consistent with the patient's history of biopsy proven Paget's disease. No  additional suspicious mass or abnormal enhancement is identified within the remainder of the right breast. Left breast: No mass or abnormal enhancement. Lymph nodes: No abnormal appearing lymph nodes. Ancillary findings:  None. IMPRESSION: 1. Abnormal skin thickening and enhancement involving the right nipple and areola, consistent with the patient's history of biopsy proven Paget's disease. No additional suspicious MRI findings are identified within the remainder of the right breast. 2. No suspicious right axillary lymphadenopathy. 3. No MRI evidence of malignancy on the left. RECOMMENDATION: Per clinical treatment plan. BI-RADS CATEGORY  6: Known biopsy-proven malignancy. Electronically Signed   By: Kristopher Oppenheim M.D.   On: 04/11/2018 16:10   US Breast Ltd Uni Right Inc Axilla  Result Date: 04/07/2018 CLINICAL DATA:  Patient has recently diagnosed Paget's disease of the right nipple, diagnosed with punch biopsy. Her right nipple is painful with intermittent bleeding. EXAM: DIGITAL DIAGNOSTIC BILATERAL MAMMOGRAM WITH CAD AND TOMO ULTRASOUND RIGHT BREAST COMPARISON:  Previous exam(s). ACR Breast  Density Category b: There are scattered areas of fibroglandular density. FINDINGS: There is thickening of the areola and right nipple compared to prior studies and to the left side. There are no discrete breast masses, no areas of architectural distortion and no suspicious calcifications. Mammographic images were processed with CAD. On physical exam, right nipple is whitish in coloration, inflamed and friable. Areola is thickened with some scaling. Targeted ultrasound is performed, showing no mass in the retroareolar right breast. There are no dilated ducts or intraductal lesions. No enlarged or abnormal lymph nodes noted in the right axilla. IMPRESSION: 1. Recently diagnosed Paget's disease of the right nipple. 2. No evidence of malignancy beyond the right nipple and areola. No evidence of metastatic adenopathy. 3.  Normal appearance of the left breast. RECOMMENDATION: Treatment as planned for the known right nipple Paget's disease. I have discussed the findings and recommendations with the patient. Results were also provided in writing at the conclusion of the visit. If applicable, a reminder letter will be sent to the patient regarding the next appointment. BI-RADS CATEGORY  6: Known biopsy-proven malignancy. Electronically Signed   By: Lajean Manes M.D.   On: 04/07/2018 14:11   Mm Diag Breast Tomo Bilateral  Result Date: 04/07/2018 CLINICAL DATA:  Patient has recently diagnosed Paget's disease of the right nipple, diagnosed with punch biopsy. Her right nipple is painful with intermittent bleeding. EXAM: DIGITAL DIAGNOSTIC BILATERAL MAMMOGRAM WITH CAD AND TOMO ULTRASOUND RIGHT BREAST COMPARISON:  Previous exam(s). ACR Breast Density Category b: There are scattered areas of fibroglandular density. FINDINGS: There is thickening of the areola and right nipple compared to prior studies and to the left side. There are no discrete breast masses, no areas of architectural distortion and no suspicious calcifications. Mammographic images were processed with CAD. On physical exam, right nipple is whitish in coloration, inflamed and friable. Areola is thickened with some scaling. Targeted ultrasound is performed, showing no mass in the retroareolar right breast. There are no dilated ducts or intraductal lesions. No enlarged or abnormal lymph nodes noted in the right axilla. IMPRESSION: 1. Recently diagnosed Paget's disease of the right nipple. 2. No evidence of malignancy beyond the right nipple and areola. No evidence of metastatic adenopathy. 3. Normal appearance of the left breast. RECOMMENDATION: Treatment as planned for the known right nipple Paget's disease. I have discussed the findings and recommendations with the patient. Results were also provided in writing at the conclusion of the visit. If applicable, a reminder letter  will be sent to the patient regarding the next appointment. BI-RADS CATEGORY  6: Known biopsy-proven malignancy. Electronically Signed   By: Lajean Manes M.D.   On: 04/07/2018 14:11      IMPRESSION/PLAN: Paget's disease of the right nipple, pending surgical opinion to confirm that lumpectomy is recommended.   Today we had a discussion with assumption that she will undergo breast conserving surgery, which is what she prefers. She knows the need for radiotherapy is much less likely if she undergoes mastectomy.  It was a pleasure meeting the patient today. We discussed the risks, benefits, and side effects of radiotherapy.  I recommend radiotherapy to the right breast to reduce her risk of locoregional recurrence by 2/3.  We discussed that radiation would take approximately 4 weeks to complete and that I would give the patient a few weeks to heal following surgery before starting treatment planning.  If chemotherapy were to be given, this would precede radiotherapy. We spoke about acute effects including skin irritation and  fatigue as well as much less common late effects including internal organ injury or irritation. We spoke about the latest technology that is used to minimize the risk of late effects for patients undergoing radiotherapy to the breast or chest wall. No guarantees of treatment were given. The patient is enthusiastic about proceeding with treatment. I look forward to participating in the patient's care.  I will await her referral back to me for postoperative follow-up and eventual CT simulation/treatment planning.   __________________________________________   Eppie Gibson, MD  This document serves as a record of services personally performed by Eppie Gibson, MD. It was created on her behalf by Rae Lips, a trained medical scribe. The creation of this record is based on the scribe's personal observations and the provider's statements to them. This document has been checked and  approved by the attending provider.

## 2018-04-16 ENCOUNTER — Other Ambulatory Visit: Payer: Self-pay | Admitting: Oncology

## 2018-04-16 NOTE — Progress Notes (Signed)
FMLA mailed to [employer] Corporate Attendance & Leave Management Team at [address], [place], [place], [place]. Copy mailed to patient address on file also.

## 2018-04-17 ENCOUNTER — Telehealth: Payer: Self-pay | Admitting: *Deleted

## 2018-04-17 NOTE — Telephone Encounter (Signed)
Informed patient of compliance results and verbalized understanding was indicated. Patient is aware and agreeable to AHI being within range at 0.3. Patient is aware and agreeable to being in compliance with machine usage. Patient is aware and agreeable to no change in current pressures. 

## 2018-04-17 NOTE — Telephone Encounter (Signed)
-----   Message from Sueanne Margarita, MD sent at 04/17/2018  1:24 PM EST ----- Good AHI and compliance.  Continue current PAP settings.

## 2018-04-24 ENCOUNTER — Other Ambulatory Visit: Payer: Self-pay

## 2018-04-24 ENCOUNTER — Ambulatory Visit (HOSPITAL_COMMUNITY): Payer: Medicare Other | Attending: Cardiovascular Disease

## 2018-04-24 DIAGNOSIS — I272 Pulmonary hypertension, unspecified: Secondary | ICD-10-CM | POA: Diagnosis not present

## 2018-04-28 ENCOUNTER — Encounter: Payer: Self-pay | Admitting: Licensed Clinical Social Worker

## 2018-04-28 ENCOUNTER — Inpatient Hospital Stay: Payer: Medicare Other

## 2018-04-28 ENCOUNTER — Inpatient Hospital Stay (HOSPITAL_BASED_OUTPATIENT_CLINIC_OR_DEPARTMENT_OTHER): Payer: Medicare Other | Admitting: Licensed Clinical Social Worker

## 2018-04-28 DIAGNOSIS — Z808 Family history of malignant neoplasm of other organs or systems: Secondary | ICD-10-CM | POA: Diagnosis not present

## 2018-04-28 DIAGNOSIS — Z171 Estrogen receptor negative status [ER-]: Secondary | ICD-10-CM

## 2018-04-28 DIAGNOSIS — C50011 Malignant neoplasm of nipple and areola, right female breast: Secondary | ICD-10-CM | POA: Diagnosis not present

## 2018-04-28 DIAGNOSIS — Z803 Family history of malignant neoplasm of breast: Secondary | ICD-10-CM

## 2018-04-28 NOTE — Progress Notes (Signed)
REFERRING PROVIDER: Chauncey Cruel, MD [place], [place] [address]  PRIMARY PROVIDER:  Leighton Ruff, MD  PRIMARY REASON FOR VISIT:  1. Malignant neoplasm involving both nipple and areola of right breast in female, estrogen receptor negative ([place])   2. Family history of breast cancer   3. Family history of melanoma      HISTORY OF PRESENT ILLNESS:   [name], a 82 y.o. female, was seen for a [facility] cancer genetics consultation at the request of Dr. Jana Hakim due to a personal and family history of breast cancer.  [name] presents to clinic today to discuss the possibility of a hereditary predisposition to cancer, genetic testing, and to further clarify her future cancer risks, as well as potential cancer risks for family members.   In 2019, at the age of 26, [name] was diagnosed with Paget's disease of the right nipple, ER-, PR-, Her2+. She will be having surgery and adjuvant radiation.   HORMONAL RISK FACTORS:  Menarche was at age 80.  First live birth at age 53.  OCP use for approximately 0 years.  Ovaries intact: yes.  Hysterectomy: yes.  Menopausal status: postmenopausal.  HRT use: 30 years. Colonoscopy: no; normal. Mammogram within the last year: yes. Number of breast biopsies: 1.  Past Medical History:  Diagnosis Date  . Anemia    takes iron  . Arthritis    Dr Ronnie Derby  . Carotid artery stenosis    1-39% bilateral dopplers 08/2015  . Cataracts, bilateral   . Chronic kidney disease    stage III Dr Mercy Moore  . Diabetes mellitus   . Family history of breast cancer   . Family history of breast cancer   . Family history of melanoma   . GERD (gastroesophageal reflux disease)   . Heart murmur    Dr Irish Lack  . HTN (hypertension)   . Hyperlipidemia   . Morbid obesity with BMI of 40.0-44.9, adult ([place])   . Obstructive sleep apnea (adult) (pediatric)    w CPAP Severe OSA AHI 52/hr CPAP at 10cm H2O  . Pulmonary HTN ([place])    mild  with PASP 71mHg echo 02/2014  . Right carotid bruit 08/19/2014  . Shortness of breath    resolved with CPAP  . Urination frequency   . Vitamin D deficiency disease   . Wears dentures    upper  . Wears glasses     Past Surgical History:  Procedure Laterality Date  . ABDOMINAL HYSTERECTOMY     partial  . APPENDECTOMY    . CARDIOVASCULAR STRESS TEST  2011  . CHOLECYSTECTOMY N/A 10/04/2015   Procedure: LAPAROSCOPIC CHOLECYSTECTOMY;  Surgeon: DCoralie Keens MD;  Location: [facility];  Service: General;  Laterality: N/A;  . COLONOSCOPY  01/15/2012   Procedure: COLONOSCOPY;  Surgeon: VLear Ng MD;  Location: [facility];  Service: Endoscopy;  Laterality: N/A;  . ESOPHAGOGASTRODUODENOSCOPY  01/15/2012   Procedure: ESOPHAGOGASTRODUODENOSCOPY (EGD);  Surgeon: VLear Ng MD;  [facility];  Service: Endoscopy;  Laterality: N/A;  . JOINT REPLACEMENT     bilateral  . left shoulder surgery      rotator cuff tear   . sleep study  2012  . TOTAL KNEE ARTHROPLASTY     Left  . TOTAL KNEE ARTHROPLASTY  07/02/2011   Procedure: TOTAL KNEE ARTHROPLASTY;  Surgeon: SRudean Haskell MD;  [facility]  Service: Orthopedics;  Laterality: Right;  . [place]ARDIOGRAPHY     2012  Social History   Socioeconomic History  . Marital status: Widowed    Spouse name: Not on file  . Number of children: Not on file  . Years of education: Not on file  . Highest education level: Not on file  Occupational History  . Not on file  Social Needs  . Financial resource strain: Not on file  . Food insecurity:    Worry: Not on file    Inability: Not on file  . Transportation needs:    Medical: No    Non-medical: No  Tobacco Use  . Smoking status: Never Smoker  . Smokeless tobacco: Never Used  Substance and Sexual Activity  . Alcohol use: No  . Drug use: No  . Sexual activity: Not Currently  Lifestyle  . Physical activity:    Days per week: Not on file    Minutes per  session: Not on file  . Stress: Not on file  Relationships  . Social connections:    Talks on phone: Not on file    Gets together: Not on file    Attends religious service: Not on file    Active member of club or organization: Not on file    Attends meetings of clubs or organizations: Not on file    Relationship status: Not on file  Other Topics Concern  . Not on file  Social History Narrative  . Not on file     FAMILY HISTORY:  We obtained a detailed, 4-generation family history.  Significant diagnoses are listed below: Family History  Problem Relation Age of Onset  . Diabetes Mother   . Diabetes Father     [name] has a daughter and a son. Her daughter, [name] was present at the session. She is 93 with no history of cancer and has two daughters. The patient's son, [name], is 23 with no cancer history and 2 children. [name] has two half sisters and a half brother through her mother. One of these sisters may have had cancer, but the patient is unsure. The patient also has two half sisters and 5 half brothers through her dad but she does not have much information about them.   [name] father died in his 20's. He did not have any siblings. [name] does not have information about her paternal grandparents.  [name] mother died in her 48's due to diabetes complications. She had 5 sisters and 3 brothers. Three of [name]'s maternal aunts had breast cancer, but she does not know ages of diagnosis. One of her maternal uncles also had cancer but she is unsure of the type. A maternal cousin had melanoma. No other cancers in aunts/uncles/cousins. [name] does not have information about her maternal grandparents.  [name] is unaware of previous family history of genetic testing for hereditary cancer risks. Patient's maternal ancestors are of [place] American and Native American descent and possibly Caucasian descent, and paternal ancestors are of [place] American  descent. There is no reported Ashkenazi Jewish ancestry. There is no known consanguinity.  GENETIC COUNSELING ASSESSMENT: [name] is a 82 y.o. female with a personal and family history which is somewhat suggestive of a Hereditary Cancer Predisposition Syndrome. We, therefore, discussed and recommended the following at today's visit.   DISCUSSION: We discussed that about 5-10% of breast cancer cases are hereditary with most cases due to BRCA mutations.  Other genes associated with hereditary breast cancer cases include ATM, CHEK2 and PALB2.  We reviewed the characteristics,  features and inheritance patterns of hereditary cancer syndromes. We also discussed genetic testing, including the appropriate family members to test, the process of testing, insurance coverage and turn-around-time for results. We discussed the implications of a negative, positive and/or variant of uncertain significant result. We recommended Ms. Farrington pursue genetic testing for the [facility] Breast Cancer STAT Panel + Common Hereditary Cancers Panel.  The STAT Breast cancer panel offered by [facility] includes sequencing and rearrangement analysis for the following 9 genes:  ATM, BRCA1, BRCA2, CDH1, CHEK2, PALB2, PTEN, STK11 and TP53.    The Common Hereditary Cancers Panel offered by [facility] includes sequencing and/or deletion duplication testing of the following 47 genes: APC, ATM, AXIN2, BARD1, BMPR1A, BRCA1, BRCA2, BRIP1, CDH1, CDKN2A (p14ARF), CDKN2A (p16INK4a), CKD4, CHEK2, CTNNA1, DICER1, EPCAM (Deletion/duplication testing only), GREM1 (promoter region deletion/duplication testing only), KIT, MEN1, MLH1, MSH2, MSH3, MSH6, MUTYH, NBN, NF1, NHTL1, PALB2, PDGFRA, PMS2, POLD1, POLE, PTEN, RAD50, RAD51C, RAD51D, SDHB, SDHC, SDHD, SMAD4, SMARCA4. STK11, TP53, TSC1, TSC2, and VHL.  The following genes were evaluated for sequence changes only: SDHA and HOXB13 c.251G>A variant only.  We discussed that if she is found to have a  mutation in one of these genes, it may impact future medical management recommendations such as increased cancer screenings and consideration of risk reducing surgeries.  A positive result could also have implications for the patient's family members.  A Negative result would mean we were unable to identify a hereditary component to her personal and family history of cancer but does not rule out the possibility of a hereditary basis for her personal and family history of cancer.  There could be mutations that are undetectable by current technology, or in genes not yet tested or identified to increase cancer risk.    We discussed the potential to find a Variant of Uncertain Significance or VUS.  These are variants that have not yet been identified as pathogenic or benign, and it is unknown if this variant is associated with increased cancer risk or if this is a normal finding.  Most VUS's are reclassified to benign or likely benign.   It should not be used to make medical management decisions. With time, we suspect the lab will determine the significance of any VUS's identified if any.   Based on Ms. Jetter's personal and family history of cancer, she meets NCCN medical criteria for genetic testing. Despite that she meets criteria, she may still have an out of pocket cost. The lab will notify her of an OOP if any.   PLAN: After considering the risks, benefits, and limitations, Ms. Vanlue  provided informed consent to pursue genetic testing and the saliva sample was sent to [facility] for analysis of the Breast Cancer STAT Panel + Common Hereditary Cancers Panel. Initial esults should be available within approximately 1 week's time, at which point they will be disclosed by telephone to Ms. Crady, as will any additional recommendations warranted by these results. Ms. Seefeld will receive a summary of her genetic counseling visit and a copy of her results once available. This information will also be  available in Epic.  Based on Ms. Millward's family history, we recommended her maternal relatives have genetic counseling and testing. Ms. Rowley will let us know if we can be of any assistance in coordinating genetic counseling and/or testing for this family member.   Lastly, we encouraged Ms. Cupp to remain in contact with cancer genetics annually so that we can continuously update the family history and inform  her of any changes in cancer genetics and testing that may be of benefit for this family.   Ms.  Issa questions were answered to her satisfaction today. Our contact information was provided should additional questions or concerns arise. Thank you for the referral and allowing [place] to share in the care of your patient.   Faith Rogue, MS Genetic Counselor [email or web address] Phone: [phone]  The patient was seen for a total of 35 minutes in face-to-face genetic counseling.  The patient was accompanied today by her daughter [name] and [name].

## 2018-05-06 ENCOUNTER — Ambulatory Visit: Payer: Self-pay | Admitting: Licensed Clinical Social Worker

## 2018-05-06 ENCOUNTER — Telehealth: Payer: Self-pay | Admitting: Licensed Clinical Social Worker

## 2018-05-06 ENCOUNTER — Encounter: Payer: Self-pay | Admitting: Licensed Clinical Social Worker

## 2018-05-06 DIAGNOSIS — Z1379 Encounter for other screening for genetic and chromosomal anomalies: Secondary | ICD-10-CM

## 2018-05-06 NOTE — Telephone Encounter (Signed)
Revealed negative genetic testing. We discussed that we do not know why she has breast cancer or why there is cancer in the family. It could be due to a different gene that we are not testing, or something our current technology cannot pick up.  It will be important for her to keep in contact with genetics to learn if additional testing may be needed in the future. Her maternal relatives could also benefit from genetic counseling and testing.

## 2018-05-06 NOTE — Progress Notes (Signed)
HPI:  Ms. Barbuto was previously seen in the [facility] clinic on 04/28/2018 due to a personal and family history of breast cancer and concerns regarding a hereditary predisposition to cancer. Please refer to our prior cancer genetics clinic note for more information regarding [name]'s medical, social and family histories, and our assessment and recommendations, at the time. Ms. Markwood recent genetic test results were disclosed to her, as well as recommendations warranted by these results. These results and recommendations are discussed in more detail below.   FAMILY HISTORY:  We obtained a detailed, 4-generation family history.  Significant diagnoses are listed below: Family History  Problem Relation Age of Onset  . Diabetes Mother   . Diabetes Father   . Breast cancer Maternal Aunt        unkown age of diagnosis  . Melanoma Cousin     Ms. Arai has a daughter and a son. Her daughter, Roselyn Reef was present at the session. She is 3 with no history of cancer and has two daughters. The patient's son, Rosaria Ferries, is 24 with no cancer history and 2 children. Ms. Greenley has two half sisters and a half brother through her mother. One of these sisters may have had cancer, but the patient is unsure. The patient also has two half sisters and 5 half brothers through her dad but she does not have much information about them.   Ms. Trejos father died in his 53's. He did not have any siblings. Ms. Bamba does not have information about her paternal grandparents.  Ms. Bawa mother died in her 74's due to diabetes complications. She had 5 sisters and 3 brothers. Three of Ms. Eisenberger's maternal aunts had breast cancer, but she does not know ages of diagnosis. One of her maternal uncles also had cancer but she is unsure of the type. A maternal cousin had melanoma. No other cancers in aunts/uncles/cousins. Ms. Kisner does not have information about her maternal grandparents.  Ms. Wheeland is  unaware of previous family history of genetic testing for hereditary cancer risks. Patient's maternal ancestors are of [place] American and Native American descent and possibly Caucasian descent, and paternal ancestors are of [place] American descent. There is no reported Ashkenazi Jewish ancestry. There is no known consanguinity.   GENETIC TEST RESULTS: Genetic testing performed through [facility]'s Breast Cancer STAT Panel + Common Hereditary Cancers Panel reported out on 05/05/2018 showed no pathogenic mutations. The STAT Breast cancer panel offered by [facility] includes sequencing and rearrangement analysis for the following 9 genes:  ATM, BRCA1, BRCA2, CDH1, CHEK2, PALB2, PTEN, STK11 and TP53. The Common Hereditary Cancers Panel offered by [facility] includes sequencing and/or deletion duplication testing of the following 47 genes: APC, ATM, AXIN2, BARD1, BMPR1A, BRCA1, BRCA2, BRIP1, CDH1, CDKN2A (p14ARF), CDKN2A (p16INK4a), CKD4, CHEK2, CTNNA1, DICER1, EPCAM (Deletion/duplication testing only), GREM1 (promoter region deletion/duplication testing only), KIT, MEN1, MLH1, MSH2, MSH3, MSH6, MUTYH, NBN, NF1, NHTL1, PALB2, PDGFRA, PMS2, POLD1, POLE, PTEN, RAD50, RAD51C, RAD51D, SDHB, SDHC, SDHD, SMAD4, SMARCA4. STK11, TP53, TSC1, TSC2, and VHL.  The following genes were evaluated for sequence changes only: SDHA and HOXB13 c.251G>A variant only.  The test report will be scanned into EPIC and will be located under the Molecular Pathology section of the Results Review tab. A portion of the result report is included below for reference.    We discussed with Ms. Dirden that because current genetic testing is not perfect, it is possible there may be a gene mutation in one of these  genes that current testing cannot detect, but that chance is small.  We also discussed, that there could be another gene that has not yet been discovered, or that we have not yet tested, that is responsible for the cancer diagnoses in the family.  It is also possible there is a hereditary cause for the cancer in the family that Ms. Mimnaugh did not inherit and therefore was not identified in her testing.  Therefore, it is important to remain in touch with cancer genetics in the future so that we can continue to offer Ms. Landsberg the most up to date genetic testing.   ADDITIONAL GENETIC TESTING: We discussed with Ms. Ruhlman that her genetic testing was fairly extensive.  If there are are genes identified to increase cancer risk that can be analyzed in the future, we would be happy to discuss and coordinate this testing at that time.    CANCER SCREENING RECOMMENDATIONS: Ms. Ventress test result is considered negative (normal).  This means that we have not identified a hereditary cause for her personal and family history of cancer at this time.   This result indicates that it is unlikely Ms. Spangler has an increased risk for a future cancer due to a mutation in one of these genes. While reassuring, this does not definitively rule out a hereditary predisposition to cancer. It is still possible that there could be genetic mutations that are undetectable by current technology, or genetic mutations in genes that have not been tested or identified to increase cancer risk.  Therefore, it is recommended she continue to follow the cancer management and screening guidelines provided by her oncology and primary healthcare provider. An individual's cancer risk is not determined by genetic test results alone.  Overall cancer risk assessment includes additional factors such as personal medical history, family history, etc.  These should be used to make a personalized plan for cancer prevention and surveillance.    RECOMMENDATIONS FOR FAMILY MEMBERS:  Relatives in this family might be at some increased risk of developing cancer, over the general population risk, simply due to the family history of cancer.  We recommended women in this family have a yearly mammogram  beginning at age 67, or 66 years younger than the earliest onset of cancer, an annual clinical breast exam, and perform monthly breast self-exams. Women in this family should also have a gynecological exam as recommended by their primary provider. All family members should have a colonoscopy as directed by their doctors.  All family members should inform their physicians about the family history of cancer so their doctors can make the most appropriate screening recommendations for them.   It is also possible there is a hereditary cause for the cancer in Ms. Fallin's family that she did not inherit and therefore was not identified in her.  We recommended maternal relatives have genetic counseling and testing. Ms. Flippin will let us know if we can be of any assistance in coordinating genetic counseling and/or testing for these family members.   FOLLOW-UP: Lastly, we discussed with Ms. Achterberg that cancer genetics is a rapidly advancing field and it is possible that new genetic tests will be appropriate for her and/or her family members in the future. We encouraged her to remain in contact with cancer genetics on an annual basis so we can update her personal and family histories and let her know of advances in cancer genetics that may benefit this family.   Our contact number was provided. Ms. Jakes questions were  answered to her satisfaction, and she knows she is welcome to call us at anytime with additional questions or concerns.  Faith Rogue, MS Genetic Counselor [email or web address] Phone: [phone]

## 2018-05-12 ENCOUNTER — Other Ambulatory Visit (HOSPITAL_COMMUNITY): Payer: Medicare Other

## 2018-05-15 ENCOUNTER — Ambulatory Visit (HOSPITAL_COMMUNITY)
Admission: RE | Admit: 2018-05-15 | Discharge: 2018-05-15 | Disposition: A | Discharge status: Home / Self Care | Payer: Medicare Other | Source: Ambulatory Visit | Attending: Cardiology | Admitting: Cardiology

## 2018-05-15 ENCOUNTER — Encounter (INDEPENDENT_AMBULATORY_CARE_PROVIDER_SITE_OTHER): Payer: Self-pay

## 2018-05-15 DIAGNOSIS — I6523 Occlusion and stenosis of bilateral carotid arteries: Secondary | ICD-10-CM | POA: Diagnosis present

## 2018-05-19 ENCOUNTER — Other Ambulatory Visit: Payer: Self-pay | Admitting: Surgery

## 2018-05-19 ENCOUNTER — Telehealth: Payer: Self-pay | Admitting: *Deleted

## 2018-05-19 NOTE — Telephone Encounter (Signed)
Patient states she has a red face indicator on her machine in the mornings when she wakes up. She has called [facility] and has made an appointment with them to troubleshoot the problem and states she will call them back again on Tuesday because she still has the red face indicator upon awakening. Patient will call our office back after calling [facility].

## 2018-05-19 NOTE — Telephone Encounter (Signed)
-----   Message from Sarina Ill, RN sent at 05/16/2018  8:24 AM EST ----- Regarding: Sleep The patient received her new CPAP a few weeks ago, she is having an issue with the mask, when she is laying down in bed. Please call and assist. Thanks,  Suezanne Jacquet

## 2018-06-06 NOTE — Pre-Procedure Instructions (Signed)
[name]  06/06/2018      [id number] - [place], [place] [address] Phone: [phone] Fax: [phone]    Your procedure is scheduled on June 11, 2018.  Report to [facility] Admitting at 630 AM.  Call this number if you have problems the morning of [phone]   Remember:  Do not eat after midnight.  You may drink clear liquids until 530 AM .  Clear liquids allowed are: Water, Juice (non-citric and without pulp), Clear Tea, Black Coffee only and Gatorade    Take these medicines the morning of surgery with A SIP OF WATER  Amlodipine (norvasc) Tylenol-if needed  Follow your surgeon's instructions on when to hold/resume aspirin.  If no instructions were given call the office to determine how they would like to you take aspirin  7 days prior to surgery STOP taking anyAleve, Naproxen, Ibuprofen, Motrin, Advil, Goody's, BC's, all herbal medications, fish oil, and all vitamins  WHAT DO I DO ABOUT MY DIABETES MEDICATION?   Marland Kitchen Do not take oral diabetes medicines (pills) the morning of surgery-pioglitazone (actos).  Reviewed and Endorsed by [facility] Patient Education Committee, August 2015  How to Manage Your Diabetes Before and After Surgery  Why is it important to control my blood sugar before and after surgery? . Improving blood sugar levels before and after surgery helps healing and can limit problems. . A way of improving blood sugar control is eating a healthy diet by: o  Eating less sugar and carbohydrates o  Increasing activity/exercise o  Talking with your doctor about reaching your blood sugar goals . High blood sugars (greater than 180 mg/dL) can raise your risk of infections and slow your recovery, so you will need to focus on controlling your diabetes during the weeks before surgery. . Make sure that the doctor who takes care of your diabetes knows about your planned surgery  including the date and location.  How do I manage my blood sugar before surgery? . Check your blood sugar at least 4 times a day, starting 2 days before surgery, to make sure that the level is not too high or low. o Check your blood sugar the morning of your surgery when you wake up and every 2 hours until you get to the Short Stay unit. . If your blood sugar is less than 70 mg/dL, you will need to treat for low blood sugar: o Do not take insulin. o Treat a low blood sugar (less than 70 mg/dL) with  cup of clear juice (cranberry or apple), 4 glucose tablets, OR glucose gel. Recheck blood sugar in 15 minutes after treatment (to make sure it is greater than 70 mg/dL). If your blood sugar is not greater than 70 mg/dL on recheck, call [phone] o  for further instructions. . Report your blood sugar to the short stay nurse when you get to Short Stay.  . If you are admitted to the hospital after surgery: o Your blood sugar will be checked by the staff and you will probably be given insulin after surgery (instead of oral diabetes medicines) to make sure you have good blood sugar levels. o The goal for blood sugar control after surgery is 80-180 mg/dL.   [facility]- Preparing For Surgery  Before surgery, you can play an important role. Because skin is not sterile, your skin needs to be as free of germs as possible. You can reduce the number  of germs on your skin by washing with CHG (chlorahexidine gluconate) Soap before surgery.  CHG is an antiseptic cleaner which kills germs and bonds with the skin to continue killing germs even after washing.    Oral Hygiene is also important to reduce your risk of infection.  Remember - BRUSH YOUR TEETH THE MORNING OF SURGERY WITH YOUR REGULAR TOOTHPASTE  Please do not use if you have an allergy to CHG or antibacterial soaps. If your skin becomes reddened/irritated stop using the CHG.  Do not shave (including legs and underarms) for at least 48 hours prior to  first CHG shower. It is OK to shave your face.  Please follow these instructions carefully.   1. Shower the NIGHT BEFORE SURGERY and the MORNING OF SURGERY with CHG.   2. If you chose to wash your hair, wash your hair first as usual with your normal shampoo.  3. After you shampoo, rinse your hair and body thoroughly to remove the shampoo.  4. Use CHG as you would any other liquid soap. You can apply CHG directly to the skin and wash gently with a scrungie or a clean washcloth.   5. Apply the CHG Soap to your body ONLY FROM THE NECK DOWN.  Do not use on open wounds or open sores. Avoid contact with your eyes, ears, mouth and genitals (private parts). Wash Face and genitals (private parts)  with your normal soap.  6. Wash thoroughly, paying special attention to the area where your surgery will be performed.  7. Thoroughly rinse your body with warm water from the neck down.  8. DO NOT shower/wash with your normal soap after using and rinsing off the CHG Soap.  9. Pat yourself dry with a CLEAN TOWEL.  10. Wear CLEAN PAJAMAS to bed the night before surgery, wear comfortable clothes the morning of surgery  11. Place CLEAN SHEETS on your bed the night of your first shower and DO NOT SLEEP WITH PETS.  Day of Surgery:  Do not apply any deodorants/lotions.  Please wear clean clothes to the hospital/surgery center.   Remember to brush your teeth WITH YOUR REGULAR TOOTHPASTE.    Do not wear jewelry, make-up or nail polish.  Do not wear lotions, powders, or perfumes, or deodorant.  Do not shave 48 hours prior to surgery.    Do not bring valuables to the hospital.  [facility] is not responsible for any belongings or valuables.  Contacts, dentures or bridgework may not be worn into surgery.  Leave your suitcase in the car.  After surgery it may be brought to your room.  For patients admitted to the hospital, discharge time will be determined by your treatment team.  Patients discharged  the day of surgery will not be allowed to drive home.    Please read over the following fact sheets that you were given.

## 2018-06-09 ENCOUNTER — Encounter (HOSPITAL_COMMUNITY): Payer: Self-pay

## 2018-06-09 ENCOUNTER — Other Ambulatory Visit: Payer: Self-pay

## 2018-06-09 ENCOUNTER — Encounter (HOSPITAL_COMMUNITY)
Admission: RE | Admit: 2018-06-09 | Discharge: 2018-06-09 | Disposition: A | Discharge status: Home / Self Care | Payer: Medicare Other | Source: Ambulatory Visit | Attending: Surgery | Admitting: Surgery

## 2018-06-09 DIAGNOSIS — Z886 Allergy status to analgesic agent status: Secondary | ICD-10-CM | POA: Diagnosis not present

## 2018-06-09 DIAGNOSIS — Z171 Estrogen receptor negative status [ER-]: Secondary | ICD-10-CM | POA: Diagnosis not present

## 2018-06-09 DIAGNOSIS — Z79899 Other long term (current) drug therapy: Secondary | ICD-10-CM

## 2018-06-09 DIAGNOSIS — G4733 Obstructive sleep apnea (adult) (pediatric): Secondary | ICD-10-CM | POA: Diagnosis not present

## 2018-06-09 DIAGNOSIS — N189 Chronic kidney disease, unspecified: Secondary | ICD-10-CM | POA: Diagnosis not present

## 2018-06-09 DIAGNOSIS — K219 Gastro-esophageal reflux disease without esophagitis: Secondary | ICD-10-CM | POA: Diagnosis not present

## 2018-06-09 DIAGNOSIS — M1990 Unspecified osteoarthritis, unspecified site: Secondary | ICD-10-CM | POA: Diagnosis not present

## 2018-06-09 DIAGNOSIS — I6529 Occlusion and stenosis of unspecified carotid artery: Secondary | ICD-10-CM | POA: Diagnosis not present

## 2018-06-09 DIAGNOSIS — N183 Chronic kidney disease, stage 3 (moderate): Secondary | ICD-10-CM

## 2018-06-09 DIAGNOSIS — I272 Pulmonary hypertension, unspecified: Secondary | ICD-10-CM | POA: Diagnosis not present

## 2018-06-09 DIAGNOSIS — Z885 Allergy status to narcotic agent status: Secondary | ICD-10-CM | POA: Diagnosis not present

## 2018-06-09 DIAGNOSIS — Z01818 Encounter for other preprocedural examination: Secondary | ICD-10-CM | POA: Insufficient documentation

## 2018-06-09 DIAGNOSIS — Z7982 Long term (current) use of aspirin: Secondary | ICD-10-CM

## 2018-06-09 DIAGNOSIS — E1122 Type 2 diabetes mellitus with diabetic chronic kidney disease: Secondary | ICD-10-CM | POA: Diagnosis not present

## 2018-06-09 DIAGNOSIS — I129 Hypertensive chronic kidney disease with stage 1 through stage 4 chronic kidney disease, or unspecified chronic kidney disease: Secondary | ICD-10-CM

## 2018-06-09 DIAGNOSIS — E559 Vitamin D deficiency, unspecified: Secondary | ICD-10-CM | POA: Diagnosis not present

## 2018-06-09 DIAGNOSIS — Z90710 Acquired absence of both cervix and uterus: Secondary | ICD-10-CM | POA: Diagnosis not present

## 2018-06-09 DIAGNOSIS — Z6839 Body mass index (BMI) 39.0-39.9, adult: Secondary | ICD-10-CM | POA: Diagnosis not present

## 2018-06-09 DIAGNOSIS — C50011 Malignant neoplasm of nipple and areola, right female breast: Secondary | ICD-10-CM | POA: Insufficient documentation

## 2018-06-09 DIAGNOSIS — D649 Anemia, unspecified: Secondary | ICD-10-CM | POA: Diagnosis not present

## 2018-06-09 DIAGNOSIS — Z9282 Status post administration of tPA (rtPA) in a different facility within the last 24 hours prior to admission to current facility: Secondary | ICD-10-CM | POA: Diagnosis not present

## 2018-06-09 DIAGNOSIS — R011 Cardiac murmur, unspecified: Secondary | ICD-10-CM | POA: Diagnosis not present

## 2018-06-09 DIAGNOSIS — E785 Hyperlipidemia, unspecified: Secondary | ICD-10-CM | POA: Diagnosis not present

## 2018-06-09 LAB — CBC
HCT: 35 % — ABNORMAL LOW (ref 36.0–46.0)
Hemoglobin: 10.9 g/dL — ABNORMAL LOW (ref 12.0–15.0)
MCH: 28.5 pg (ref 26.0–34.0)
MCHC: 31.1 g/dL (ref 30.0–36.0)
MCV: 91.6 fL (ref 80.0–100.0)
Platelets: 290 10*3/uL (ref 150–400)
RBC: 3.82 MIL/uL — ABNORMAL LOW (ref 3.87–5.11)
RDW: 15.2 % (ref 11.5–15.5)
WBC: 4.9 10*3/uL (ref 4.0–10.5)
nRBC: 0 % (ref 0.0–0.2)

## 2018-06-09 LAB — BASIC METABOLIC PANEL
Anion gap: 7 (ref 5–15)
BUN: 16 mg/dL (ref 8–23)
CALCIUM: 9.4 mg/dL (ref 8.9–10.3)
CO2: 25 mmol/L (ref 22–32)
Chloride: 105 mmol/L (ref 98–111)
Creatinine, Ser: 1.17 mg/dL — ABNORMAL HIGH (ref 0.44–1.00)
GFR calc non Af Amer: 44 mL/min — ABNORMAL LOW (ref >60)
GFR, EST AFRICAN AMERICAN: 51 mL/min — AB (ref >60)
Glucose, Bld: 102 mg/dL — ABNORMAL HIGH (ref 70–99)
Potassium: 4.3 mmol/L (ref 3.5–5.1)
Sodium: 137 mmol/L (ref 135–145)

## 2018-06-09 LAB — HEMOGLOBIN A1C
Hgb A1c MFr Bld: 5.9 % — ABNORMAL HIGH (ref 4.8–5.6)
Mean Plasma Glucose: 122.63 mg/dL

## 2018-06-09 LAB — GLUCOSE, CAPILLARY: Glucose-Capillary: 91 mg/dL (ref 70–99)

## 2018-06-09 NOTE — Progress Notes (Signed)
PCP - Dr. Leighton Ruff MD Cardiologist -  Dr. Fransico Him and Dr. Irish Lack  Chest x-ray - N/A EKG - 04/08/18 Stress Test - 2011 ECHO - 04/2018  Sleep Study - 2012 CPAP - will bring mask and hose  Fasting Blood Sugar - <120 Checks Blood Sugar every other day  Blood Thinner Instructions: N/A Aspirin Instructions: will call Dr. Trevor Mace office  Pt has been under abnormal amount of stress recently with her grandson attempting suicide. Pt contracted the flu despite the flu shot. Was given tamiflu and has been symptom free for a few weeks.  Anesthesia review: yes, Carotid artery stenosis/cardiac hx  Patient denies shortness of breath, fever, cough and chest pain at PAT appointment   Patient verbalized understanding of instructions that were given to them at the PAT appointment. Patient was also instructed that they will need to review over the PAT instructions again at home before surgery.

## 2018-06-10 NOTE — Progress Notes (Signed)
Anesthesia Chart Review:  Case:  [id number] Date/Time:  06/11/18 0815   Procedure:  RIGHT BREAST PARTIAL MASTECTOMY (Right Breast)   Anesthesia type:  General   Pre-op diagnosis:  RIGHT BREAST PADGETS DISEASE   Location:  [place] OR ROOM 01 / [facility]   Surgeon:  Coralie Keens, MD      DISCUSSION: 83 yo female never smoker. Pertinent hx includes HTN, DM2, CKD, diastolic HF, OSA on CPAP, obesity and moderate pulmonary HTN.   Pt follows with Dr. Radford Pax for OSA, obesity, moderate pulmonary HTN, diastolic dysfunction and HTN. Last seen 04/08/18. [name] the pt doing better with recent CPAP, denied any chest pain or pressure, SOB, DOE, PND, orthopnea, LE edema, dizziness, palpitations or syncope. She is compliant with her meds and is tolerating meds with no SE.    Dr. Radford Pax referred pt to Dr. Jeffie Pollock for further eval of [name]. Per his note 04/11/2018 the pt had significant improvement in her functional status post completion of pulmonary rehab and no longer requires supplemental O2. She completes ADLs independently and can go to the store without a problem. "PFTs reviewed personally and shows mixed obstructive/restrictive picture. Hi-res CT - No ILD". Stated no role at this point for selective pulmonary vasodilators.   Echo 09/20/2016 showed EF 60-65%, Mild MR, Mod TR, PA systolic pressure mod increased with peak pressure 59 mmHg. Tricuspid regurg peak velocity 375  cm/s  .  Echo 05/29/17 showed EF 60-65%, mild MR, trivial TR, mod pulm htn with PA peak pressure 50 mm Hg. Tricuspid regurg peak velocity 343 cm/s      Most recent echo 04/24/18 showed EF 60-65%, Mild MR, Mod TR, PA systolic pressure moderately increased with peak pressure 64 mm Hg.  Tricuspid regurg peak velocity 375 cm/s   Echo results appear fairly stable over time. Given recent cardiology f/u and relatively good functional status, anticipate she can proceed as planned barring acute status change.   VS: BP 138/67   Pulse 60   Temp 36.5  C   Ht 5' 1.5" (1.562 m)   Wt 94.2 kg   SpO2 97%   BMI 38.61 kg/m   PROVIDERS: Leighton Ruff, MD is PCP  Fransico Him, MD is Cardiologist  Glori Bickers, MD is HF Cardiologist  LABS: Labs reviewed: Acceptable for surgery. (all labs ordered are listed, but only abnormal results are displayed)  Labs Reviewed  CBC - Abnormal; Notable for the following components:      Result Value   RBC 3.82 (*)    Hemoglobin 10.9 (*)    HCT 35.0 (*)    All other components within normal limits  BASIC METABOLIC PANEL - Abnormal; Notable for the following components:   Glucose, Bld 102 (*)    Creatinine, Ser 1.17 (*)    GFR calc non Af Amer 44 (*)    GFR calc Af Amer 51 (*)    All other components within normal limits  HEMOGLOBIN A1C - Abnormal; Notable for the following components:   Hgb A1c MFr Bld 5.9 (*)    All other components within normal limits  GLUCOSE, CAPILLARY     IMAGES: CT Chest Hi Res 03/22/17: IMPRESSION: 1. Limited motion degraded scan, with no convincing findings of interstitial lung disease at this time. 2. Mild cardiomegaly.  One vessel coronary atherosclerosis.  EKG: 04/08/2018: NSR. Rate 61. NSSTT wave abn.  CV: TTE 04/24/2018: Study Conclusions  - Left ventricle: The cavity size was normal. Wall thickness was   increased  in a pattern of mild LVH. Systolic function was normal.   The estimated ejection fraction was in the range of 60% to 65%.   Wall motion was normal; there were no regional wall motion   abnormalities. - Mitral valve: There was mild regurgitation. - Tricuspid valve: There was moderate regurgitation. - Pulmonary arteries: Systolic pressure was moderately increased.   PA peak pressure: 64 mm Hg (S).   Past Medical History:  Diagnosis Date  . Anemia    takes iron  . Arthritis    Dr Ronnie Derby  . Carotid artery stenosis    1-39% bilateral dopplers 08/2015  . Cataracts, bilateral   . Chronic kidney disease    stage III Dr  Mercy Moore  . Diabetes mellitus   . Family history of breast cancer   . Family history of breast cancer   . Family history of melanoma   . GERD (gastroesophageal reflux disease)   . Heart murmur    Dr Irish Lack  . HTN (hypertension)   . Hyperlipidemia   . Morbid obesity with BMI of 40.0-44.9, adult ([place])   . Obstructive sleep apnea (adult) (pediatric)    w CPAP Severe OSA AHI 52/hr CPAP at 10cm H2O  . Pulmonary HTN ([place])    mild with PASP 30mmHg echo 02/2014  . Right carotid bruit 08/19/2014  . Shortness of breath    resolved with CPAP  . Urination frequency   . Vitamin D deficiency disease   . Wears dentures    upper  . Wears glasses     Past Surgical History:  Procedure Laterality Date  . ABDOMINAL HYSTERECTOMY     partial  . APPENDECTOMY    . CARDIOVASCULAR STRESS TEST  2011  . CHOLECYSTECTOMY N/A 10/04/2015   Procedure: LAPAROSCOPIC CHOLECYSTECTOMY;  Surgeon: Coralie Keens, MD;  Location: [facility];  Service: General;  Laterality: N/A;  . COLONOSCOPY  01/15/2012   Procedure: COLONOSCOPY;  Surgeon: Lear Ng, MD;  Location: [facility];  Service: Endoscopy;  Laterality: N/A;  . ESOPHAGOGASTRODUODENOSCOPY  01/15/2012   Procedure: ESOPHAGOGASTRODUODENOSCOPY (EGD);  Surgeon: Lear Ng, MD;  Location: [facility];  Service: Endoscopy;  Laterality: N/A;  . JOINT REPLACEMENT Bilateral    Knees  . left shoulder surgery      rotator cuff tear   . sleep study  2012  . TOTAL KNEE ARTHROPLASTY     Left  . TOTAL KNEE ARTHROPLASTY  07/02/2011   Procedure: TOTAL KNEE ARTHROPLASTY;  Surgeon: Rudean Haskell, MD;  Location: [facility];  Service: Orthopedics;  Laterality: Right;  . US ECHOCARDIOGRAPHY     2012    MEDICATIONS: . acetaminophen (TYLENOL) 500 MG tablet  . amLODipine (NORVASC) 10 MG tablet  . aspirin EC 81 MG tablet  . atorvastatin (LIPITOR) 40 MG tablet  . Cholecalciferol (VITAMIN D) 2000 UNITS CAPS  . docusate sodium (COLACE) 100 MG capsule  .  Esomeprazole Magnesium 20 MG TBEC  . ezetimibe (ZETIA) 10 MG tablet  . furosemide (LASIX) 40 MG tablet  . Multiple Vitamins-Minerals (MULTIVITAMINS THER. W/MINERALS) TABS  . pioglitazone (ACTOS) 30 MG tablet  . spironolactone (ALDACTONE) 25 MG tablet   No current facility-administered medications for this encounter.     [name] [facility] Short Stay Center/Anesthesiology Phone [phone] 06/10/2018 10:08 AM

## 2018-06-10 NOTE — H&P (Signed)
[name] Documented: 05/19/2018 10:51 Olsen Location: [facility] Patient #: [id number] DOB: 1936-01-30 Widowed / Language: [name] / Race: Black or African American Female   History of Present Illness ([name]) The patient is a 83 year old female who presents for a pre-op visit. She is here today for a preoperative visit. She has seen medical and radiation oncology as well as genetics. Again, she has Paget's disease with possible malignancy of the right rest nipple. She is otherwise doing very well  PAST MEDICAL HISTORY:  has a past medical history of Anemia, Arthritis, Carotid artery stenosis, Cataracts, bilateral, Chronic kidney disease, Diabetes mellitus, GERD (gastroesophageal reflux disease), Heart murmur, HTN (hypertension), Hyperlipidemia, Morbid obesity with BMI of 40.0-44.9, adult ([place]), Obstructive sleep apnea (adult) (pediatric), Pulmonary HTN ([place]), Right carotid bruit (08/19/2014), Shortness of breath, Urination frequency, Vitamin D deficiency disease, Wears dentures, and Wears glasses.   PAST SURGICAL HISTORY:      Past Surgical History:  Procedure Laterality Date  . ABDOMINAL HYSTERECTOMY     partial  . APPENDECTOMY    . CARDIOVASCULAR STRESS TEST  2011  . CHOLECYSTECTOMY N/A 10/04/2015   Procedure: LAPAROSCOPIC CHOLECYSTECTOMY;  Surgeon: Tiffany Keens, Olsen;  Location: [facility];  Service: General;  Laterality: N/A;  . COLONOSCOPY  01/15/2012   Procedure: COLONOSCOPY;  Surgeon: [name], Olsen;  Location: [facility];  Service: Endoscopy;  Laterality: N/A;  . ESOPHAGOGASTRODUODENOSCOPY  01/15/2012   Procedure: ESOPHAGOGASTRODUODENOSCOPY (EGD);  Surgeon: [name], Olsen;  Location: [facility];  Service: Endoscopy;  Laterality: N/A;  . JOINT REPLACEMENT     bilateral  . left shoulder surgery      rotator cuff tear   . sleep study  2012  . TOTAL KNEE ARTHROPLASTY     Left  . TOTAL  KNEE ARTHROPLASTY  07/02/2011   Procedure: TOTAL KNEE ARTHROPLASTY;  Surgeon: Tiffany Haskell, Olsen;  Location: [facility];  Service: Orthopedics;  Laterality: Right;  . US ECHOCARDIOGRAPHY     2012    Allergies [name], CMA; 05/19/2018 10:52 Olsen) Codeine Sulfate *ANALGESICS - OPIOID*  Ibuprofen *ANALGESICS - ANTI-INFLAMMATORY*  Allergies Reconciled   Medication History [name], CMA; 05/19/2018 10:52 Olsen) Atorvastatin Calcium (40MG  Tablet, Oral) Active. Spironolactone (25MG  Tablet, Oral) Active. Ezetimibe (10MG  Tablet, Oral) Active. AmLODIPine Besylate (10MG  Tablet, Oral) Active. Hydrocodone-Acetaminophen (5-325MG  Tablet, Oral) Active. Esomeprazole Magnesium (40MG  Capsule DR, Oral) Active. Furosemide (40MG  Tablet, Oral) Active. Pioglitazone HCl (30MG  Tablet, Oral) Active. Valsartan (320MG  Tablet, Oral) Active. Tylenol (500MG  Capsule, Oral) Active. Aspirin (81MG  Tablet, Oral) Active. Vitamin D (2000UNIT Capsule, Oral) Active. Multivitamin Adult (Oral) Active. Medications Reconciled  Vitals [name] CMA; 05/19/2018 10:52 Olsen) 05/19/2018 10:51 Olsen Weight: 208.25 lb Height: 61.5in Body Surface Area: 1.93 m Body Mass Index: 38.71 kg/m  Temp.: 97.66F  Pulse: 64 (Regular)  BP: 132/78 (Sitting, Left Arm, Standard)       Physical Exam ([name]; 05/19/2018 11:14 Olsen) The physical exam findings are as follows: Note:Again on examination, there is Paget's of the nipple of the right breast. Lungs clear CV RRR Abdomen soft, NT/ND Skin otherwise without erythema Psych normal   Assessment & Plan ([name]; 05/19/2018 11:15 Olsen) PAGET DISEASE OF BREAST, RIGHT (C50.011) Impression: We had a discussion regarding surgical options including a right mastectomy versus a central partial mastectomy with removal of the nipple and areolar complex. She wants to proceed as conservatively as possible so we will proceed  with the right breast central partial mastectomy. I discussed the surgical procedure in detail with the patient and her daughter. [name] was to proceed with surgery which will be scheduled

## 2018-06-10 NOTE — Anesthesia Preprocedure Evaluation (Addendum)
Anesthesia Evaluation  Patient identified by MRN, date of birth, ID band Patient awake    Reviewed: Allergy & Precautions, NPO status , Patient's Chart, lab work & pertinent test results  History of Anesthesia Complications Negative for: history of anesthetic complications  Airway Mallampati: I  TM Distance: >3 FB Neck ROM: Full    Dental  (+) Edentulous Upper, Missing, Dental Advisory Given   Pulmonary sleep apnea and Continuous Positive Airway Pressure Ventilation ,    breath sounds clear to auscultation       Cardiovascular hypertension, Pt. on medications (-) angina Rhythm:Regular Rate:Normal  11/19 ECHO: EF 60-65%, mild MR, mod TR   Neuro/Psych negative neurological ROS     GI/Hepatic Neg liver ROS, GERD  Medicated and Controlled,  Endo/Other  diabetes (glu 93), Oral Hypoglycemic AgentsMorbid obesity  Renal/GU negative Renal ROS     Musculoskeletal   Abdominal (+) + obese,   Peds  Hematology  (+) Blood dyscrasia (Hb 10.9), anemia ,   Anesthesia Other Findings   Reproductive/Obstetrics                           Anesthesia Physical Anesthesia Plan  ASA: III  Anesthesia Plan: General   Post-op Pain Management:    Induction: Intravenous  PONV Risk Score and Plan: 3 and Dexamethasone, Ondansetron and Treatment may vary due to age or medical condition  Airway Management Planned: LMA  Additional Equipment:   Intra-op Plan:   Post-operative Plan:   Informed Consent: I have reviewed the patients History and Physical, chart, labs and discussed the procedure including the risks, benefits and alternatives for the proposed anesthesia with the patient or authorized representative who has indicated his/her understanding and acceptance.   Dental advisory given  Plan Discussed with: Surgeon and CRNA  Anesthesia Plan Comments: (See PAT note by Karoline Caldwell, PA-C )      Anesthesia  Quick Evaluation

## 2018-06-11 ENCOUNTER — Ambulatory Visit (HOSPITAL_COMMUNITY): Payer: Medicare Other | Admitting: Physician Assistant

## 2018-06-11 ENCOUNTER — Encounter (HOSPITAL_COMMUNITY)
Admission: RE | Disposition: A | Discharge status: Home / Self Care | Payer: Self-pay | Source: Home / Self Care | Attending: Surgery

## 2018-06-11 ENCOUNTER — Observation Stay (HOSPITAL_COMMUNITY)
Admission: RE | Admit: 2018-06-11 | Discharge: 2018-06-12 | Disposition: A | Discharge status: Home / Self Care | Payer: Medicare Other | Attending: Surgery | Admitting: Surgery

## 2018-06-11 ENCOUNTER — Other Ambulatory Visit: Payer: Self-pay

## 2018-06-11 ENCOUNTER — Ambulatory Visit (HOSPITAL_COMMUNITY): Payer: Medicare Other | Admitting: Anesthesiology

## 2018-06-11 ENCOUNTER — Encounter (HOSPITAL_COMMUNITY): Payer: Self-pay

## 2018-06-11 DIAGNOSIS — Z171 Estrogen receptor negative status [ER-]: Secondary | ICD-10-CM | POA: Insufficient documentation

## 2018-06-11 DIAGNOSIS — E1122 Type 2 diabetes mellitus with diabetic chronic kidney disease: Secondary | ICD-10-CM | POA: Insufficient documentation

## 2018-06-11 DIAGNOSIS — E785 Hyperlipidemia, unspecified: Secondary | ICD-10-CM | POA: Insufficient documentation

## 2018-06-11 DIAGNOSIS — I129 Hypertensive chronic kidney disease with stage 1 through stage 4 chronic kidney disease, or unspecified chronic kidney disease: Secondary | ICD-10-CM | POA: Insufficient documentation

## 2018-06-11 DIAGNOSIS — Z9282 Status post administration of tPA (rtPA) in a different facility within the last 24 hours prior to admission to current facility: Secondary | ICD-10-CM | POA: Insufficient documentation

## 2018-06-11 DIAGNOSIS — C50011 Malignant neoplasm of nipple and areola, right female breast: Secondary | ICD-10-CM | POA: Diagnosis not present

## 2018-06-11 DIAGNOSIS — M1990 Unspecified osteoarthritis, unspecified site: Secondary | ICD-10-CM | POA: Insufficient documentation

## 2018-06-11 DIAGNOSIS — Z7982 Long term (current) use of aspirin: Secondary | ICD-10-CM | POA: Insufficient documentation

## 2018-06-11 DIAGNOSIS — N189 Chronic kidney disease, unspecified: Secondary | ICD-10-CM | POA: Insufficient documentation

## 2018-06-11 DIAGNOSIS — G4733 Obstructive sleep apnea (adult) (pediatric): Secondary | ICD-10-CM | POA: Insufficient documentation

## 2018-06-11 DIAGNOSIS — Z6839 Body mass index (BMI) 39.0-39.9, adult: Secondary | ICD-10-CM | POA: Insufficient documentation

## 2018-06-11 DIAGNOSIS — Z886 Allergy status to analgesic agent status: Secondary | ICD-10-CM | POA: Insufficient documentation

## 2018-06-11 DIAGNOSIS — I6529 Occlusion and stenosis of unspecified carotid artery: Secondary | ICD-10-CM | POA: Insufficient documentation

## 2018-06-11 DIAGNOSIS — Z79899 Other long term (current) drug therapy: Secondary | ICD-10-CM | POA: Insufficient documentation

## 2018-06-11 DIAGNOSIS — Z90710 Acquired absence of both cervix and uterus: Secondary | ICD-10-CM | POA: Insufficient documentation

## 2018-06-11 DIAGNOSIS — Z885 Allergy status to narcotic agent status: Secondary | ICD-10-CM | POA: Insufficient documentation

## 2018-06-11 DIAGNOSIS — K219 Gastro-esophageal reflux disease without esophagitis: Secondary | ICD-10-CM | POA: Insufficient documentation

## 2018-06-11 DIAGNOSIS — R011 Cardiac murmur, unspecified: Secondary | ICD-10-CM | POA: Insufficient documentation

## 2018-06-11 DIAGNOSIS — D649 Anemia, unspecified: Secondary | ICD-10-CM | POA: Insufficient documentation

## 2018-06-11 DIAGNOSIS — I272 Pulmonary hypertension, unspecified: Secondary | ICD-10-CM | POA: Insufficient documentation

## 2018-06-11 DIAGNOSIS — E559 Vitamin D deficiency, unspecified: Secondary | ICD-10-CM | POA: Insufficient documentation

## 2018-06-11 HISTORY — PX: MASTECTOMY, PARTIAL: SHX709

## 2018-06-11 LAB — GLUCOSE, CAPILLARY
GLUCOSE-CAPILLARY: 123 mg/dL — AB (ref 70–99)
Glucose-Capillary: 93 mg/dL (ref 70–99)
Glucose-Capillary: 115 mg/dL — ABNORMAL HIGH (ref 70–99)

## 2018-06-11 SURGERY — MASTECTOMY PARTIAL
Anesthesia: General | Site: Breast | Laterality: Right

## 2018-06-11 MED ORDER — MORPHINE SULFATE (PF) 2 MG/ML IV SOLN: 1.0000 mg | INTRAVENOUS | Status: DC | PRN

## 2018-06-11 MED ORDER — SPIRONOLACTONE 25 MG PO TABS: 25.0000 mg | ORAL_TABLET | ORAL | Status: DC

## 2018-06-11 MED ORDER — LIDOCAINE 2% (20 MG/ML) 5 ML SYRINGE
INTRAMUSCULAR | Status: DC | PRN
  Administered 2018-06-11: 50 mg via INTRAVENOUS

## 2018-06-11 MED ORDER — ONDANSETRON HCL 4 MG/2ML IJ SOLN
INTRAMUSCULAR | Status: AC
  Filled 2018-06-11: qty 2

## 2018-06-11 MED ORDER — GABAPENTIN 300 MG PO CAPS
300.0000 mg | ORAL_CAPSULE | ORAL | Status: AC
  Administered 2018-06-11: 300 mg via ORAL

## 2018-06-11 MED ORDER — BUPIVACAINE-EPINEPHRINE (PF) 0.25% -1:200000 IJ SOLN
INTRAMUSCULAR | Status: AC
  Filled 2018-06-11: qty 30

## 2018-06-11 MED ORDER — BUPIVACAINE-EPINEPHRINE 0.25% -1:200000 IJ SOLN
INTRAMUSCULAR | Status: DC | PRN
  Administered 2018-06-11: 20 mL

## 2018-06-11 MED ORDER — LACTATED RINGERS IV SOLN
INTRAVENOUS | Status: DC | PRN
  Administered 2018-06-11: 08:00:00 via INTRAVENOUS

## 2018-06-11 MED ORDER — OXYCODONE HCL 5 MG PO TABS
5.0000 mg | ORAL_TABLET | Freq: Four times a day (QID) | ORAL | Status: DC | PRN
  Administered 2018-06-11 – 2018-06-12 (×2): 5 mg via ORAL
  Filled 2018-06-11 (×2): qty 1

## 2018-06-11 MED ORDER — ONDANSETRON HCL 4 MG/2ML IJ SOLN
4.0000 mg | Freq: Four times a day (QID) | INTRAMUSCULAR | Status: DC | PRN
  Administered 2018-06-11 (×2): 4 mg via INTRAVENOUS
  Filled 2018-06-11: qty 2

## 2018-06-11 MED ORDER — ACETAMINOPHEN 500 MG PO TABS
1000.0000 mg | ORAL_TABLET | ORAL | Status: AC
  Administered 2018-06-11: 1000 mg via ORAL

## 2018-06-11 MED ORDER — ENOXAPARIN SODIUM 30 MG/0.3ML ~~LOC~~ SOLN
30.0000 mg | SUBCUTANEOUS | Status: DC
  Administered 2018-06-12: 30 mg via SUBCUTANEOUS
  Filled 2018-06-11: qty 0.3

## 2018-06-11 MED ORDER — PROPOFOL 10 MG/ML IV BOLUS
INTRAVENOUS | Status: DC | PRN
  Administered 2018-06-11: 150 mg via INTRAVENOUS

## 2018-06-11 MED ORDER — CHLORHEXIDINE GLUCONATE CLOTH 2 % EX PADS: 6.0000 | MEDICATED_PAD | Freq: Once | CUTANEOUS | Status: DC

## 2018-06-11 MED ORDER — SODIUM CHLORIDE 0.9 % IV SOLN
INTRAVENOUS | Status: DC
  Administered 2018-06-11: 15:00:00 via INTRAVENOUS

## 2018-06-11 MED ORDER — TRAMADOL HCL 50 MG PO TABS: 50.0000 mg | ORAL_TABLET | Freq: Four times a day (QID) | ORAL | Status: DC | PRN

## 2018-06-11 MED ORDER — FENTANYL CITRATE (PF) 100 MCG/2ML IJ SOLN
INTRAMUSCULAR | Status: DC | PRN
  Administered 2018-06-11 (×2): 50 ug via INTRAVENOUS
  Administered 2018-06-11: 25 ug via INTRAVENOUS
  Administered 2018-06-11: 50 ug via INTRAVENOUS
  Administered 2018-06-11: 25 ug via INTRAVENOUS

## 2018-06-11 MED ORDER — ATORVASTATIN CALCIUM 40 MG PO TABS
40.0000 mg | ORAL_TABLET | Freq: Every day | ORAL | Status: DC
  Administered 2018-06-11: 40 mg via ORAL
  Filled 2018-06-11: qty 1

## 2018-06-11 MED ORDER — PANTOPRAZOLE SODIUM 20 MG PO TBEC
20.0000 mg | DELAYED_RELEASE_TABLET | Freq: Every day | ORAL | Status: DC
  Administered 2018-06-12: 20 mg via ORAL
  Filled 2018-06-11 (×2): qty 1

## 2018-06-11 MED ORDER — LIDOCAINE 2% (20 MG/ML) 5 ML SYRINGE
INTRAMUSCULAR | Status: AC
  Filled 2018-06-11: qty 5

## 2018-06-11 MED ORDER — ONDANSETRON HCL 4 MG/2ML IJ SOLN
INTRAMUSCULAR | Status: DC | PRN
  Administered 2018-06-11: 4 mg via INTRAVENOUS

## 2018-06-11 MED ORDER — ONDANSETRON 4 MG PO TBDP: 4.0000 mg | ORAL_TABLET | Freq: Four times a day (QID) | ORAL | Status: DC | PRN

## 2018-06-11 MED ORDER — AMLODIPINE BESYLATE 10 MG PO TABS
10.0000 mg | ORAL_TABLET | Freq: Every day | ORAL | Status: DC
  Administered 2018-06-12: 10 mg via ORAL
  Filled 2018-06-11: qty 1

## 2018-06-11 MED ORDER — FENTANYL CITRATE (PF) 250 MCG/5ML IJ SOLN
INTRAMUSCULAR | Status: AC
  Filled 2018-06-11: qty 5

## 2018-06-11 MED ORDER — DIPHENHYDRAMINE HCL 50 MG/ML IJ SOLN: 12.5000 mg | Freq: Four times a day (QID) | INTRAMUSCULAR | Status: DC | PRN

## 2018-06-11 MED ORDER — SPIRONOLACTONE 25 MG PO TABS
25.0000 mg | ORAL_TABLET | ORAL | Status: DC
  Administered 2018-06-12: 25 mg via ORAL
  Filled 2018-06-11: qty 1

## 2018-06-11 MED ORDER — ESOMEPRAZOLE MAGNESIUM 20 MG PO TBEC: 20.0000 mg | DELAYED_RELEASE_TABLET | Freq: Every day | ORAL | Status: DC

## 2018-06-11 MED ORDER — CEFAZOLIN SODIUM-DEXTROSE 2-4 GM/100ML-% IV SOLN
2.0000 g | INTRAVENOUS | Status: AC
  Administered 2018-06-11: 2 g via INTRAVENOUS

## 2018-06-11 MED ORDER — PIOGLITAZONE HCL 30 MG PO TABS
30.0000 mg | ORAL_TABLET | Freq: Every day | ORAL | Status: DC
  Administered 2018-06-11 – 2018-06-12 (×2): 30 mg via ORAL
  Filled 2018-06-11 (×2): qty 1

## 2018-06-11 MED ORDER — EPHEDRINE SULFATE-NACL 50-0.9 MG/10ML-% IV SOSY
PREFILLED_SYRINGE | INTRAVENOUS | Status: DC | PRN
  Administered 2018-06-11 (×2): 10 mg via INTRAVENOUS

## 2018-06-11 MED ORDER — ACETAMINOPHEN 500 MG PO TABS
ORAL_TABLET | ORAL | Status: AC
  Administered 2018-06-11: 1000 mg via ORAL
  Filled 2018-06-11: qty 2

## 2018-06-11 MED ORDER — GABAPENTIN 300 MG PO CAPS
ORAL_CAPSULE | ORAL | Status: AC
  Administered 2018-06-11: 300 mg via ORAL
  Filled 2018-06-11: qty 1

## 2018-06-11 MED ORDER — EZETIMIBE 10 MG PO TABS
10.0000 mg | ORAL_TABLET | Freq: Every day | ORAL | Status: DC
  Administered 2018-06-11: 10 mg via ORAL
  Filled 2018-06-11: qty 1

## 2018-06-11 MED ORDER — DIPHENHYDRAMINE HCL 12.5 MG/5ML PO ELIX: 12.5000 mg | ORAL_SOLUTION | Freq: Four times a day (QID) | ORAL | Status: DC | PRN

## 2018-06-11 MED ORDER — CEFAZOLIN SODIUM-DEXTROSE 2-4 GM/100ML-% IV SOLN
INTRAVENOUS | Status: AC
  Filled 2018-06-11: qty 100

## 2018-06-11 MED ORDER — FUROSEMIDE 40 MG PO TABS
40.0000 mg | ORAL_TABLET | Freq: Every day | ORAL | Status: DC
  Administered 2018-06-12: 40 mg via ORAL
  Filled 2018-06-11 (×2): qty 1

## 2018-06-11 SURGICAL SUPPLY — 42 items
ADH SKN CLS APL DERMABOND .7 (GAUZE/BANDAGES/DRESSINGS) ×1
BINDER BREAST LRG (GAUZE/BANDAGES/DRESSINGS) IMPLANT
BINDER BREAST XLRG (GAUZE/BANDAGES/DRESSINGS) ×2 IMPLANT
CANISTER SUCT 3000ML PPV (MISCELLANEOUS) IMPLANT
CHLORAPREP W/TINT 26ML (MISCELLANEOUS) ×3 IMPLANT
COVER SURGICAL LIGHT HANDLE (MISCELLANEOUS) ×3 IMPLANT
COVER WAND RF STERILE (DRAPES) ×3 IMPLANT
DECANTER SPIKE VIAL GLASS SM (MISCELLANEOUS) ×3 IMPLANT
DERMABOND ADVANCED (GAUZE/BANDAGES/DRESSINGS) ×2
DERMABOND ADVANCED .7 DNX12 (GAUZE/BANDAGES/DRESSINGS) ×1 IMPLANT
DRAPE LAPAROTOMY 100X72 PEDS (DRAPES) ×3 IMPLANT
DRAPE UTILITY XL STRL (DRAPES) ×6 IMPLANT
ELECT CAUTERY BLADE 6.4 (BLADE) ×3 IMPLANT
ELECT REM PT RETURN 9FT ADLT (ELECTROSURGICAL) ×3
ELECTRODE REM PT RTRN 9FT ADLT (ELECTROSURGICAL) ×1 IMPLANT
GAUZE 4X4 16PLY RFD (DISPOSABLE) ×3 IMPLANT
GLOVE SURG SIGNA 7.5 PF LTX (GLOVE) ×3 IMPLANT
GOWN STRL REUS W/ TWL LRG LVL3 (GOWN DISPOSABLE) ×1 IMPLANT
GOWN STRL REUS W/ TWL XL LVL3 (GOWN DISPOSABLE) ×1 IMPLANT
GOWN STRL REUS W/TWL LRG LVL3 (GOWN DISPOSABLE) ×3
GOWN STRL REUS W/TWL XL LVL3 (GOWN DISPOSABLE) ×3
KIT BASIN OR (CUSTOM PROCEDURE TRAY) ×3 IMPLANT
KIT TURNOVER KIT B (KITS) ×3 IMPLANT
NDL HYPO 25GX1X1/2 BEV (NEEDLE) ×1 IMPLANT
NEEDLE HYPO 25GX1X1/2 BEV (NEEDLE) ×3 IMPLANT
NS IRRIG 1000ML POUR BTL (IV SOLUTION) ×3 IMPLANT
PACK GENERAL/GYN (CUSTOM PROCEDURE TRAY) ×3 IMPLANT
PAD ARMBOARD 7.5X6 YLW CONV (MISCELLANEOUS) ×6 IMPLANT
PENCIL BUTTON HOLSTER BLD 10FT (ELECTRODE) ×3 IMPLANT
PENCIL SMOKE EVACUATOR (MISCELLANEOUS) ×3 IMPLANT
SUT MNCRL AB 4-0 PS2 18 (SUTURE) ×3 IMPLANT
SUT SILK 2 0 SH (SUTURE) ×2 IMPLANT
SUT VIC AB 3-0 SH 27 (SUTURE) ×6
SUT VIC AB 3-0 SH 27X BRD (SUTURE) ×1 IMPLANT
SUT VIC AB 3-0 SH 27XBRD (SUTURE) IMPLANT
SYR BULB 3OZ (MISCELLANEOUS) ×3 IMPLANT
SYR CONTROL 10ML LL (SYRINGE) ×3 IMPLANT
TOWEL OR 17X24 6PK STRL BLUE (TOWEL DISPOSABLE) ×3 IMPLANT
TOWEL OR 17X26 10 PK STRL BLUE (TOWEL DISPOSABLE) ×3 IMPLANT
TUBE CONNECTING 12'X1/4 (SUCTIONS)
TUBE CONNECTING 12X1/4 (SUCTIONS) IMPLANT
YANKAUER SUCT BULB TIP NO VENT (SUCTIONS) IMPLANT

## 2018-06-11 NOTE — Anesthesia Postprocedure Evaluation (Signed)
Anesthesia Post Note  Patient: [name]  Procedure(s) Performed: RIGHT BREAST PARTIAL MASTECTOMY (Right Breast)     Patient location during evaluation: PACU Anesthesia Type: General Level of consciousness: awake and alert, patient cooperative and oriented Pain management: pain level controlled Vital Signs Assessment: post-procedure vital signs reviewed and stable Respiratory status: spontaneous breathing, nonlabored ventilation and respiratory function stable Cardiovascular status: blood pressure returned to baseline and stable Postop Assessment: no apparent nausea or vomiting Anesthetic complications: no    Last Vitals:  Vitals:   06/11/18 1043 06/11/18 1045  BP: (!) 118/56   Pulse: 64 65  Resp: 15 15  Temp:    SpO2: 100% 99%    Last Pain:  Vitals:   06/11/18 1115  TempSrc:   PainSc: 0-No pain                 Welborn Keena,E. Winson Eichorn

## 2018-06-11 NOTE — Anesthesia Procedure Notes (Signed)
Procedure Name: LMA Insertion Date/Time: 06/11/2018 8:32 AM Performed by: Lieutenant Diego, CRNA Pre-anesthesia Checklist: Patient identified, Emergency Drugs available, Suction available and Patient being monitored Patient Re-evaluated:Patient Re-evaluated prior to induction Oxygen Delivery Method: Circle system utilized Preoxygenation: Pre-oxygenation with 100% oxygen Induction Type: IV induction Ventilation: Mask ventilation without difficulty LMA: LMA inserted LMA Size: 4.0 Number of attempts: 1 Placement Confirmation: positive ETCO2 and breath sounds checked- equal and bilateral Tube secured with: Tape Dental Injury: Teeth and Oropharynx as per pre-operative assessment

## 2018-06-11 NOTE — Transfer of Care (Signed)
Immediate Anesthesia Transfer of Care Note  Patient: [name]  Procedure(s) Performed: RIGHT BREAST PARTIAL MASTECTOMY (Right Breast)  Patient Location: PACU  Anesthesia Type:General  Level of Consciousness: awake  Airway & Oxygen Therapy: Patient Spontanous Breathing and Patient connected to face mask oxygen  Post-op Assessment: Report given to RN and Post -op Vital signs reviewed and stable  Post vital signs: Reviewed and stable  Last Vitals:  Vitals Value Taken Time  BP 123/54 06/11/2018  9:14 AM  Temp    Pulse 69 06/11/2018  9:14 AM  Resp 16 06/11/2018  9:14 AM  SpO2 100 % 06/11/2018  9:14 AM  Vitals shown include unvalidated device data.  Last Pain:  Vitals:   06/11/18 0720  TempSrc:   PainSc: 0-No pain         Complications: No apparent anesthesia complications

## 2018-06-11 NOTE — Interval H&P Note (Signed)
History and Physical Interval Note:no change in H and P  06/11/2018 8:13 AM  [name]  has presented today for surgery, with the diagnosis of RIGHT BREAST PADGETS DISEASE  The various methods of treatment have been discussed with the patient and family. After consideration of risks, benefits and other options for treatment, the patient has consented to  Procedure(s): RIGHT BREAST PARTIAL MASTECTOMY (Right) as a surgical intervention .  The patient's history has been reviewed, patient examined, no change in status, stable for surgery.  I have reviewed the patient's chart and labs.  Questions were answered to the patient's satisfaction.     Coralie Keens

## 2018-06-11 NOTE — Op Note (Signed)
RIGHT BREAST CENTRAL  PARTIAL MASTECTOMY  Procedure Note  [name] 06/11/2018   Pre-op Diagnosis: RIGHT BREAST PADGETS DISEASE     Post-op Diagnosis: same  Procedure(s): RIGHT BREAST CENTRAL PARTIAL MASTECTOMY  Surgeon(s): Coralie Keens, MD  Anesthesia: General  Staff:  Circulator: Hal Morales, RN Scrub Person: Romero Liner, CST Circulator Assistant: Rozell Searing, RN; Gifford Shave, RN; Nolen Mu, RN  Estimated Blood Loss: Minimal               Specimens: sent to path  Indications: This is an 83 year old female with Paget's disease of the right nipple.  Her MRI does not demonstrate any underlying mass.  She has seen both medical and radiation oncology.  She has had genetic testing.  The decision was made to proceed with a limited central mastectomy excising the nipple areolar complex.  Procedure: The patient was brought to the operating room and identified the correct patient.  She is placed upon on the operating room table and general anesthesia was induced.  Her right breast was then prepped and draped in usual sterile fashion.  I anesthetized the skin around the areola with Marcaine.  I then made an elliptical incision including the nipple areolar complex with a scalpel.  I then dissected down to the breast tissue with the cautery.  I then performed a central partial mastectomy removing the breast tissue around and underneath the nipple and areola.  I dissected down approximately 3 to 4 cm below the nipple and completed the partial mastectomy with the cautery.  I then marked the 12 o'clock position of the areola with a silk suture.  The specimen was sent to pathology.  I achieved hemostasis with the cautery.  I irrigated the wound with saline.  I then closed the incision with interrupted 3-0 Vicryl sutures and a running 4-0 Monocryl.  Dermabond was then applied.  The patient tolerated the procedure well.  All the counts were correct at the end of  the procedure.  The patient was then extubated in the operating room and taken in a stable condition to the recovery room.          Coralie Keens   Date: 06/11/2018  Time: 9:05 AM

## 2018-06-11 NOTE — Progress Notes (Signed)
Received pt from PACU. Patient alert and oriented. Dressing clean, dry, intact. Complaining of nausea. Spoke with Dr. Ninfa Linden and he stated continue PRN Zofran. Patient oriented to room and call bell. Patient's daughter at bedside.

## 2018-06-12 ENCOUNTER — Encounter (HOSPITAL_COMMUNITY): Payer: Self-pay | Admitting: Surgery

## 2018-06-12 DIAGNOSIS — C50011 Malignant neoplasm of nipple and areola, right female breast: Secondary | ICD-10-CM | POA: Diagnosis not present

## 2018-06-12 MED ORDER — TRAMADOL HCL 50 MG PO TABS: 50.0000 mg | ORAL_TABLET | Freq: Four times a day (QID) | ORAL | 0 refills | Status: DC | PRN

## 2018-06-12 NOTE — Discharge Summary (Signed)
Physician Discharge Summary  Patient ID: [name] MRN: [id number] DOB/AGE: [birth year] 81 y.o.  Admit date: 06/11/2018 Discharge date: 06/12/2018  Admission Diagnoses:  Discharge Diagnoses:  Active Problems:   Paget disease of breast, right [facility])   Discharged Condition: good  Hospital Course: uneventful post op recovery.  Discharged home POD#1  Consults: None  Significant Diagnostic Studies:   Treatments: surgery: right breast central partial mastectomy  Discharge Exam: Blood pressure 125/60, pulse (!) 55, temperature (!) 97.4 F (36.3 C), temperature source Oral, resp. rate 14, height 5\' 1"  (1.549 m), weight 95.2 kg, SpO2 (!) 57 %. General appearance: alert, cooperative and no distress Resp: clear to auscultation bilaterally Incision/Wound: right breast incision healing well  Disposition: Discharge disposition: 01-Home or Self Care        Allergies as of 06/12/2018      Reactions   Codeine Itching   Ibuprofen Other (See Comments)   "gave her kidney trouble"      Medication List    TAKE these medications   acetaminophen 500 MG tablet Commonly known as:  TYLENOL Take 500 mg by mouth every 8 (eight) hours as needed for mild pain, moderate pain, fever or headache.   amLODipine 10 MG tablet Commonly known as:  NORVASC Take 10 mg by mouth daily.   aspirin EC 81 MG tablet Take 81 mg by mouth daily.   atorvastatin 40 MG tablet Commonly known as:  LIPITOR Take 40 mg by mouth at bedtime.   docusate sodium 100 MG capsule Commonly known as:  COLACE Take 100 mg by mouth daily as needed for mild constipation.   Esomeprazole Magnesium 20 MG Tbec Take 20 mg by mouth daily.   ezetimibe 10 MG tablet Commonly known as:  ZETIA Take 10 mg by mouth at bedtime.   furosemide 40 MG tablet Commonly known as:  LASIX Take 40 mg by mouth daily.   multivitamins ther. w/minerals Tabs tablet Take 1 tablet by mouth daily.   pioglitazone 30 MG tablet Commonly  known as:  ACTOS Take 30 mg by mouth daily.   spironolactone 25 MG tablet Commonly known as:  ALDACTONE Take 25 mg by mouth every other day.   traMADol 50 MG tablet Commonly known as:  ULTRAM Take 1 tablet (50 mg total) by mouth every 6 (six) hours as needed (mild pain).   Vitamin D 50 MCG (2000 UT) Caps Take 2,000 Units by mouth daily.      Follow-up Information    Coralie Keens, MD. Schedule an appointment as soon as possible for a visit in 3 week(s).   Specialty:  General Surgery Contact information: [address] [place] [address] [phone]           Signed: Coralie Keens 06/12/2018, 7:21 AM

## 2018-06-12 NOTE — Plan of Care (Signed)
  Problem: Safety: Goal: Ability to remain free from injury will improve 06/12/2018 1046 by Netta Neat B, RN Outcome: Progressing 06/12/2018 1042 by Lorrin Mais, RN Outcome: Progressing   Problem: Education: Goal: Knowledge of General Education information will improve Description Including pain rating scale, medication(s)/side effects and non-pharmacologic comfort measures 06/12/2018 1046 by Lorrin Mais, RN Outcome: Progressing 06/12/2018 1042 by Lorrin Mais, RN Outcome: Progressing   Problem: Health Behavior/Discharge Planning: Goal: Ability to manage health-related needs will improve 06/12/2018 1046 by Netta Neat B, RN Outcome: Progressing 06/12/2018 1042 by Lorrin Mais, RN Outcome: Progressing   Problem: Clinical Measurements: Goal: Ability to maintain clinical measurements within normal limits will improve 06/12/2018 1046 by Netta Neat B, RN Outcome: Progressing 06/12/2018 1042 by Lorrin Mais, RN Outcome: Progressing Goal: Will remain free from infection 06/12/2018 1046 by Netta Neat B, RN Outcome: Progressing 06/12/2018 1042 by Lorrin Mais, RN Outcome: Progressing Goal: Diagnostic test results will improve 06/12/2018 1046 by Netta Neat B, RN Outcome: Progressing 06/12/2018 1042 by Lorrin Mais, RN Outcome: Progressing Goal: Respiratory complications will improve 06/12/2018 1046 by Lorrin Mais, RN Outcome: Progressing 06/12/2018 1042 by Lorrin Mais, RN Outcome: Progressing Goal: Cardiovascular complication will be avoided 06/12/2018 1046 by Netta Neat B, RN Outcome: Progressing 06/12/2018 1042 by Lorrin Mais, RN Outcome: Progressing   Problem: Activity: Goal: Risk for activity intolerance will decrease 06/12/2018 1046 by Netta Neat B, RN Outcome: Progressing 06/12/2018 1042 by Lorrin Mais, RN Outcome: Progressing   Problem: Nutrition: Goal: Adequate nutrition will be maintained 06/12/2018  1046 by Lorrin Mais, RN Outcome: Progressing 06/12/2018 1042 by Lorrin Mais, RN Outcome: Progressing   Problem: Coping: Goal: Level of anxiety will decrease 06/12/2018 1046 by Netta Neat B, RN Outcome: Progressing 06/12/2018 1042 by Lorrin Mais, RN Outcome: Progressing   Problem: Elimination: Goal: Will not experience complications related to bowel motility 06/12/2018 1046 by Lorrin Mais, RN Outcome: Progressing 06/12/2018 1042 by Lorrin Mais, RN Outcome: Progressing Goal: Will not experience complications related to urinary retention 06/12/2018 1046 by Lorrin Mais, RN Outcome: Progressing 06/12/2018 1042 by Lorrin Mais, RN Outcome: Progressing   Problem: Pain Managment: Goal: General experience of comfort will improve 06/12/2018 1046 by Netta Neat B, RN Outcome: Progressing 06/12/2018 1042 by Lorrin Mais, RN Outcome: Progressing   Problem: Safety: Goal: Ability to remain free from injury will improve 06/12/2018 1046 by Netta Neat B, RN Outcome: Progressing 06/12/2018 1042 by Lorrin Mais, RN Outcome: Progressing   Problem: Skin Integrity: Goal: Risk for impaired skin integrity will decrease 06/12/2018 1046 by Netta Neat B, RN Outcome: Progressing 06/12/2018 1042 by Lorrin Mais, RN Outcome: Progressing

## 2018-06-12 NOTE — Plan of Care (Signed)
  Problem: Safety: Goal: Ability to remain free from injury will improve Outcome: Progressing   Problem: Education: Goal: Knowledge of General Education information will improve Description Including pain rating scale, medication(s)/side effects and non-pharmacologic comfort measures Outcome: Progressing   Problem: Clinical Measurements: Goal: Ability to maintain clinical measurements within normal limits will improve Outcome: Progressing   Problem: Clinical Measurements: Goal: Respiratory complications will improve Outcome: Progressing   Problem: Activity: Goal: Risk for activity intolerance will decrease Outcome: Progressing   Problem: Nutrition: Goal: Adequate nutrition will be maintained Outcome: Progressing

## 2018-06-12 NOTE — Plan of Care (Signed)
  Problem: Safety: Goal: Ability to remain free from injury will improve Outcome: Progressing   Problem: Education: Goal: Knowledge of General Education information will improve Description: Including pain rating scale, medication(s)/side effects and non-pharmacologic comfort measures Outcome: Progressing   Problem: Health Behavior/Discharge Planning: Goal: Ability to manage health-related needs will improve Outcome: Progressing   Problem: Clinical Measurements: Goal: Ability to maintain clinical measurements within normal limits will improve Outcome: Progressing Goal: Will remain free from infection Outcome: Progressing Goal: Diagnostic test results will improve Outcome: Progressing Goal: Respiratory complications will improve Outcome: Progressing Goal: Cardiovascular complication will be avoided Outcome: Progressing   Problem: Activity: Goal: Risk for activity intolerance will decrease Outcome: Progressing   Problem: Nutrition: Goal: Adequate nutrition will be maintained Outcome: Progressing   Problem: Coping: Goal: Level of anxiety will decrease Outcome: Progressing   Problem: Elimination: Goal: Will not experience complications related to bowel motility Outcome: Progressing Goal: Will not experience complications related to urinary retention Outcome: Progressing   Problem: Pain Managment: Goal: General experience of comfort will improve Outcome: Progressing   Problem: Safety: Goal: Ability to remain free from injury will improve Outcome: Progressing   Problem: Skin Integrity: Goal: Risk for impaired skin integrity will decrease Outcome: Progressing   

## 2018-06-12 NOTE — Progress Notes (Signed)
Patient ID: [name], female   DOB: 1936/05/28, 83 y.o.   MRN: [id number]  Doing well Right breast incision clean Lungs clear  Plan: Discharge home

## 2018-06-12 NOTE — Discharge Instructions (Signed)
[place] Office Phone Number [phone]  BREAST BIOPSY/ PARTIAL MASTECTOMY: POST OP INSTRUCTIONS  Always review your discharge instruction sheet given to you by the facility where your surgery was performed.  IF YOU HAVE DISABILITY OR FAMILY LEAVE FORMS, YOU MUST BRING THEM TO THE OFFICE FOR PROCESSING.  DO NOT GIVE THEM TO YOUR DOCTOR.  1. A prescription for pain medication may be given to you upon discharge.  Take your pain medication as prescribed, if needed.  If narcotic pain medicine is not needed, then you may take acetaminophen (Tylenol) or ibuprofen (Advil) as needed. 2. Take your usually prescribed medications unless otherwise directed 3. If you need a refill on your pain medication, please contact your pharmacy.  They will contact our office to request authorization.  Prescriptions will not be filled after 5pm or on week-ends. 4. You should eat very light the first 24 hours after surgery, such as soup, crackers, pudding, etc.  Resume your normal diet the day after surgery. 5. Most patients will experience some swelling and bruising in the breast.  Ice packs and a good support bra will help.  Swelling and bruising can take several days to resolve.  6. It is common to experience some constipation if taking pain medication after surgery.  Increasing fluid intake and taking a stool softener will usually help or prevent this problem from occurring.  A mild laxative (Milk of Magnesia or Miralax) should be taken according to package directions if there are no bowel movements after 48 hours. 7. Unless discharge instructions indicate otherwise, you may remove your bandages 24-48 hours after surgery, and you may shower at that time.  You may have steri-strips (small skin tapes) in place directly over the incision.  These strips should be left on the skin for 7-10 days.  If your surgeon used skin glue on the incision, you may shower in 24 hours.  The glue will flake off over the  next 2-3 weeks.  Any sutures or staples will be removed at the office during your follow-up visit. 8. ACTIVITIES:  You may resume regular daily activities (gradually increasing) beginning the next day.  Wearing a good support bra or sports bra minimizes pain and swelling.  You may have sexual intercourse when it is comfortable. a. You may drive when you no longer are taking prescription pain medication, you can comfortably wear a seatbelt, and you can safely maneuver your car and apply brakes. b. RETURN TO WORK:  ______________________________________________________________________________________ 9. You should see your doctor in the office for a follow-up appointment approximately two weeks after your surgery.  Your doctors nurse will typically make your follow-up appointment when she calls you with your pathology report.  Expect your pathology report 2-3 business days after your surgery.  You may call to check if you do not hear from [place] after three days. 10. OTHER INSTRUCTIONS:OK TO SHOWER STARTING TODAY 11. WEAR THE BINDER FOR COMFORT.  YOU MAY STOP WEARING IT WHEN YOU WANT TO _______________________________________________________________________________________________ _____________________________________________________________________________________________________________________________________ _____________________________________________________________________________________________________________________________________ _____________________________________________________________________________________________________________________________________  WHEN TO CALL YOUR DOCTOR: 1. Fever over 101.0 2. Nausea and/or vomiting. 3. Extreme swelling or bruising. 4. Continued bleeding from incision. 5. Increased pain, redness, or drainage from the incision.  The clinic staff is available to answer your questions during regular business hours.  Please dont hesitate to call and ask to  speak to one of the nurses for clinical concerns.  If you have a medical emergency, go to the nearest emergency room or call 911.  A  surgeon from [facility] is always on call at the hospital.  For further questions, please visit [email or web address]

## 2018-06-23 ENCOUNTER — Telehealth: Payer: Self-pay | Admitting: *Deleted

## 2018-06-23 NOTE — Telephone Encounter (Signed)
-----   Message from Sueanne Margarita, MD sent at 06/19/2018  8:48 AM EST ----- Good AHI and compliance.  Continue current PAP settings.

## 2018-06-23 NOTE — Telephone Encounter (Signed)
Informed patient of compliance results and patient understanding was verbalized. Patient is aware and agreeable to AHI being within range at 0.4. Patient is aware and agreeable to being in compliance with machine usage Patient is aware and agreeable to no change in current pressures

## 2018-07-08 ENCOUNTER — Telehealth: Payer: Self-pay | Admitting: Oncology

## 2018-07-08 NOTE — Telephone Encounter (Signed)
Scheduled appt per 2/4 sch message - pt is aware of apt date and time

## 2018-07-10 ENCOUNTER — Inpatient Hospital Stay: Payer: Medicare Other | Admitting: Oncology

## 2018-07-14 ENCOUNTER — Telehealth: Payer: Self-pay | Admitting: Oncology

## 2018-07-14 ENCOUNTER — Inpatient Hospital Stay: Payer: Medicare Other | Attending: Oncology | Admitting: Oncology

## 2018-07-14 ENCOUNTER — Inpatient Hospital Stay: Payer: Medicare Other

## 2018-07-14 VITALS — BP 141/90 | HR 57 | Temp 98.1°F | Resp 18 | Ht 61.0 in | Wt 213.6 lb

## 2018-07-14 DIAGNOSIS — Z7982 Long term (current) use of aspirin: Secondary | ICD-10-CM | POA: Insufficient documentation

## 2018-07-14 DIAGNOSIS — D0511 Intraductal carcinoma in situ of right breast: Secondary | ICD-10-CM

## 2018-07-14 DIAGNOSIS — Z171 Estrogen receptor negative status [ER-]: Secondary | ICD-10-CM

## 2018-07-14 DIAGNOSIS — Z6841 Body Mass Index (BMI) 40.0 and over, adult: Secondary | ICD-10-CM

## 2018-07-14 DIAGNOSIS — C50011 Malignant neoplasm of nipple and areola, right female breast: Secondary | ICD-10-CM

## 2018-07-14 DIAGNOSIS — Z79899 Other long term (current) drug therapy: Secondary | ICD-10-CM | POA: Diagnosis not present

## 2018-07-14 LAB — CBC WITH DIFFERENTIAL/PLATELET
Abs Immature Granulocytes: 0.03 10*3/uL (ref 0.00–0.07)
BASOS ABS: 0.1 10*3/uL (ref 0.0–0.1)
Basophils Relative: 1 %
Eosinophils Absolute: 0.1 10*3/uL (ref 0.0–0.5)
Eosinophils Relative: 3 %
HCT: 33.1 % — ABNORMAL LOW (ref 36.0–46.0)
Hemoglobin: 10.4 g/dL — ABNORMAL LOW (ref 12.0–15.0)
Immature Granulocytes: 1 %
Lymphocytes Relative: 27 %
Lymphs Abs: 1.2 10*3/uL (ref 0.7–4.0)
MCH: 28.1 pg (ref 26.0–34.0)
MCHC: 31.4 g/dL (ref 30.0–36.0)
MCV: 89.5 fL (ref 80.0–100.0)
Monocytes Absolute: 0.4 10*3/uL (ref 0.1–1.0)
Monocytes Relative: 10 %
Neutro Abs: 2.6 10*3/uL (ref 1.7–7.7)
Neutrophils Relative %: 58 %
Platelets: 246 10*3/uL (ref 150–400)
RBC: 3.7 MIL/uL — ABNORMAL LOW (ref 3.87–5.11)
RDW: 15.9 % — ABNORMAL HIGH (ref 11.5–15.5)
WBC: 4.4 10*3/uL (ref 4.0–10.5)
nRBC: 0 % (ref 0.0–0.2)

## 2018-07-14 LAB — COMPREHENSIVE METABOLIC PANEL
ALT: 8 U/L (ref 0–44)
AST: 16 U/L (ref 15–41)
Albumin: 3.7 g/dL (ref 3.5–5.0)
Alkaline Phosphatase: 82 U/L (ref 38–126)
Anion gap: 9 (ref 5–15)
BUN: 15 mg/dL (ref 8–23)
CO2: 25 mmol/L (ref 22–32)
Calcium: 9.7 mg/dL (ref 8.9–10.3)
Chloride: 110 mmol/L (ref 98–111)
Creatinine, Ser: 1.22 mg/dL — ABNORMAL HIGH (ref 0.44–1.00)
GFR calc Af Amer: 48 mL/min — ABNORMAL LOW (ref >60)
GFR calc non Af Amer: 42 mL/min — ABNORMAL LOW (ref >60)
Glucose, Bld: 149 mg/dL — ABNORMAL HIGH (ref 70–99)
Potassium: 4.4 mmol/L (ref 3.5–5.1)
Sodium: 144 mmol/L (ref 135–145)
Total Bilirubin: 0.5 mg/dL (ref 0.3–1.2)
Total Protein: 7.5 g/dL (ref 6.5–8.1)

## 2018-07-14 NOTE — Progress Notes (Signed)
[place]  Telephone:([phone]) [phone] Fax:(336) [phone]     ID: SAMON DISHNER DOB: 12/10/1935  MR#: [id number]  XAJ#:[id number]  Patient Care Team: Leighton Ruff, MD as PCP - General (Family Medicine) Lawerance Cruel, MD as Consulting Physician (Family Medicine) Coralie Keens, MD as Consulting Physician (General Surgery) Ovie Eastep, Virgie Dad, MD as Consulting Physician (Oncology) Bensimhon, Shaune Pascal, MD as Consulting Physician (Cardiology) Eppie Gibson, MD as Attending Physician (Radiation Oncology) Allyn Kenner, MD (Dermatology) PCP: Leighton Ruff, MD Chauncey Cruel, MD GYN: SU:  OTHER MD:  CHIEF COMPLAINT: Paget's disease of the right nipple, estrogen receptor negative, HER-2 amplified  CURRENT TREATMENT: observation    HISTORY OF PRESENT ILLNESS: From the original intake note:  [name] is a 83 y.o. female with a new diagnosis of Paget's disease of the right nipple.   The patient reports discoloration of the right nipple present for approximately 2 years.  She eventually developed some nipple bleeding and scabbing.  She had bilateral diagnostic mammography with tomography and right breast ultrasonography at the [facility] 01/04/2017 showing the breast density to be category B.  Mammography and ultrasonography were unremarkable, but on exam the right nipple was discolored, pink and somewhat scabbed.  There was no bleeding or discharge.  As the nipple problem persisted the patient was referred to dermatology and on 03/07/2018 Dr. [name] did a shave biopsy of the right nipple showing ([id number]) Paget's disease of the nipple, estrogen and progesterone receptor negative, but HER-2 amplified by immunohistochemistry (3+).  The patient was then referred to Dr. Ninfa Linden who set her up for repeat mammogram and Breast US done on 04/07/2018.  This showed recently diagnosed Paget's disease of the right nipple. No evidence of malignancy beyond the  right nipple and areola. No evidence of metastatic adenopathy. Normal appearance of the left breast.   The patient's subsequent history is as detailed below  INTERVAL HISTORY [name] returns for follow up of Paget's disease of the right nipple. She is accompanied by her son.  Since her last visit, she underwent bilateral breast MRI on 04/11/2018, with results showing, aside from biopsy-proven Paget's disease, no additional suspicious MRI finding within the remainder of the right breast, no suspicious right axillary lymphadenopathy, no MRI evidence of malignancy on the left.  She also underwent genetic testing on 04/28/2018, with results negative for pathogenic mutations.  She underwent right breast central partial mastectomy on 06/11/2018 under Dr. Ninfa Linden. 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 She state the surgery went well overall. She reports occasional pain, but she denies bleeding, fevers, and rash.   Her case was re-presented at the multidisciplinary breast cancer conference 06/25/2018.  At that time the consensus was that given the patient's age, comorbidities, and surgical results [name] would do well without any further active treatment but only observation.   REVIEW OF SYSTEMS: [name] reports doing well overall. She continues to drive herself, and she is able to shower on her own. The patient denies unusual headaches, visual changes, nausea, vomiting, stiff neck, dizziness, or gait imbalance. There has been no cough, phlegm production, or pleurisy, no chest pain or pressure, and no change in bowel or bladder habits. The patient denies fever, rash, bleeding, unexplained fatigue or unexplained weight loss. A detailed review of systems was otherwise entirely negative.   Allergies  Allergen Reactions  .  Codeine Itching  . Ibuprofen Other (See Comments)    "gave her kidney trouble"    Current Outpatient Medications  Medication Sig Dispense Refill  . acetaminophen (TYLENOL) 500 MG tablet Take 500 mg by mouth every 8 (eight) hours as needed for mild pain, moderate pain, fever or headache.     Marland Kitchen amLODipine (NORVASC) 10 MG tablet Take 10 mg by mouth daily.  6  . aspirin EC 81 MG tablet Take 81 mg by mouth daily.    Marland Kitchen atorvastatin (LIPITOR) 40 MG tablet Take 40 mg by mouth at bedtime.    . Cholecalciferol (VITAMIN D) 2000 UNITS CAPS Take 2,000 Units by mouth daily.    Marland Kitchen docusate sodium (COLACE) 100 MG capsule Take 100 mg by mouth daily as needed for mild constipation.    . Esomeprazole Magnesium 20 MG TBEC Take 20 mg by mouth daily.     Marland Kitchen ezetimibe (ZETIA) 10 MG tablet Take 10 mg by mouth at bedtime.    . furosemide (LASIX) 40 MG tablet Take 40 mg by mouth daily.     . Multiple Vitamins-Minerals (MULTIVITAMINS THER. W/MINERALS) TABS Take 1 tablet by mouth daily.    . pioglitazone (ACTOS) 30 MG tablet Take 30 mg by mouth daily.    Marland Kitchen spironolactone (ALDACTONE) 25 MG tablet Take 25 mg by mouth every other day.     . traMADol (ULTRAM) 50 MG tablet Take 1 tablet (50 mg total) by mouth every 6 (six) hours as needed (mild pain). 25 tablet 0   No current facility-administered medications for this visit.     PAST MEDICAL HISTORY: Past Medical History:  Diagnosis Date  . Anemia    takes iron  . Arthritis    Dr Ronnie Derby  . Carotid artery stenosis    1-39% bilateral dopplers 08/2015  . Cataracts, bilateral   . Chronic kidney disease    stage III Dr Mercy Moore  . Diabetes mellitus   . Family history of breast cancer   . Family history of breast cancer   . Family history of melanoma   . GERD (gastroesophageal reflux disease)   . Heart murmur    Dr Irish Lack  . HTN (hypertension)   . Hyperlipidemia   . Morbid obesity with BMI of 40.0-44.9, adult ([place])   . Obstructive sleep apnea (adult)  (pediatric)    w CPAP Severe OSA AHI 52/hr CPAP at 10cm H2O  . Pulmonary HTN ([place])    mild with PASP 42mHg echo 02/2014  . Right carotid bruit 08/19/2014  . Shortness of breath    resolved with CPAP  . Urination frequency   . Vitamin D deficiency disease   . Wears dentures    upper  . Wears glasses     PAST SURGICAL HISTORY: Past Surgical History:  Procedure Laterality Date  . ABDOMINAL HYSTERECTOMY     partial  . APPENDECTOMY    . CARDIOVASCULAR STRESS TEST  2011  . CHOLECYSTECTOMY N/A 10/04/2015   Procedure: LAPAROSCOPIC CHOLECYSTECTOMY;  Surgeon: DCoralie Keens MD;  Location: [facility];  Service: General;  Laterality: N/A;  . COLONOSCOPY  01/15/2012   Procedure: COLONOSCOPY;  Surgeon: VLear Ng MD;  [facility];  Service: Endoscopy;  Laterality: N/A;  . ESOPHAGOGASTRODUODENOSCOPY  01/15/2012   Procedure: ESOPHAGOGASTRODUODENOSCOPY (EGD);  Surgeon: VLear Ng MD;  [facility];  Service: Endoscopy;  Laterality: N/A;  . JOINT REPLACEMENT Bilateral    Knees  . left shoulder surgery      rotator cuff  tear   . MASTECTOMY, PARTIAL Right 06/11/2018   Procedure: RIGHT BREAST PARTIAL MASTECTOMY;  Surgeon: Coralie Keens, MD;  Location: [facility];  Service: General;  Laterality: Right;  . sleep study  2012  . TOTAL KNEE ARTHROPLASTY     Left  . TOTAL KNEE ARTHROPLASTY  07/02/2011   Procedure: TOTAL KNEE ARTHROPLASTY;  Surgeon: Rudean Haskell, MD;  Location: [facility];  Service: Orthopedics;  Laterality: Right;  . US ECHOCARDIOGRAPHY     2012    FAMILY HISTORY Family History  Problem Relation Age of Onset  . Diabetes Mother   . Diabetes Father   . Breast cancer Maternal Aunt        unkown age of diagnosis  . Melanoma Cousin   Her father was diabetic, and died after a fall in his early 51s. Her mother was also a diabetic, died in her 58s due to diabetic complications. The patient has one full brother and 63 half brothers. She has 2 full sisters,  one passed away from tonic obstructive pulmonary disease at age 32.  The patient also had 2 half-sisters, one half sister died from a heart disease.  3 out of 6 maternal aunts had breast cancer. She denies a family history of ovarian cancer.  GENETICS TESTING: Be scheduled  GYNECOLOGIC HISTORY:  Menarche: 62 YO First child: age 77 GXP2  Hysterectomy in her 44s. She took hormone replacement for almost 30 years. 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 She lives with her daughter [name] and 2 granddaughters, aged 54 (a [employer] teaching in pregnancy centers), and 25 (completed college but not currently working). Her daughter [name] works at [employer] Her son [name] lives in [place] and has 2 children.  The patient has 2 great-grandchildren.  She is a member of a local [place]: <no information>   HEALTH MAINTENANCE: Social History   Tobacco Use  . Smoking status: Never Smoker  . Smokeless tobacco: Never Used  Substance Use Topics  . Alcohol use: No  . Drug use: No     Colonoscopy: at [facility]  PAP:  Bone density: Never  Lipid panel:    OBJECTIVE: Morbidly obese African-American woman in no acute distress  Vitals:   07/14/18 1520  BP: (!) 141/90  Pulse: (!) 57  Resp: 18  Temp: 98.1 F (36.7 C)  SpO2: 100%     Body mass index is 40.36 kg/m.    ECOG FS:1 - Symptomatic but completely ambulatory Vitals:   07/14/18 1520  BP: (!) 141/90  Pulse: (!) 57  Resp: 18  Temp: 98.1 F (36.7 C)  TempSrc: Oral  SpO2: 100%  Weight: 213 lb 9.6 oz (96.9 kg)  Height: '5\' 1"'$  (1.549 m)   Sclerae unicteric, EOMs intact No cervical or supraclavicular adenopathy Lungs no rales or rhonchi Heart regular rate and rhythm Abd soft, pes, nontender, positive bowel sounds MSK no focal spinal tenderness, no upper extremity lymphedema Neuro: nonfocal, well oriented, appropriate affect Breasts: The right breast is  status post central lumpectomy.  The incision is healing nicely, without erythema, swelling, or dehiscence.  The left breast is benign.  Both axillae are benign.  Picture of her right breast taken on 04/07/2018     LAB RESULTS:  CMP     Component Value Date/Time   NA 144 07/14/2018 1500   K 4.4 07/14/2018 1500   CL 110 07/14/2018 1500   CO2 25  07/14/2018 1500   GLUCOSE 149 (H) 07/14/2018 1500   BUN 15 07/14/2018 1500   CREATININE 1.22 (H) 07/14/2018 1500   CREATININE 1.33 (H) 04/08/2018 1549   CALCIUM 9.7 07/14/2018 1500   PROT 7.5 07/14/2018 1500   ALBUMIN 3.7 07/14/2018 1500   AST 16 07/14/2018 1500   AST 18 04/08/2018 1549   ALT 8 07/14/2018 1500   ALT 12 04/08/2018 1549   ALKPHOS 82 07/14/2018 1500   BILITOT 0.5 07/14/2018 1500   BILITOT 0.5 04/08/2018 1549   GFRNONAA 42 (L) 07/14/2018 1500   GFRNONAA 37 (L) 04/08/2018 1549   GFRAA 48 (L) 07/14/2018 1500   GFRAA 43 (L) 04/08/2018 1549    No results found for: KPAFRELGTCHN, LAMBDASER, KAPLAMBRATIO  No results found for: TOTALPROTELP, ALBUMINELP, A1GS, A2GS, BETS, BETA2SER, GAMS, MSPIKE, SPEI  Lab Results  Component Value Date   WBC 4.4 07/14/2018   NEUTROABS 2.6 07/14/2018   HGB 10.4 (L) 07/14/2018   HCT 33.1 (L) 07/14/2018   MCV 89.5 07/14/2018   PLT 246 07/14/2018    '@LASTCHEMISTRY'$ @  No results found for: LABCA2  No components found for: [id number]  No results for input(s): INR in the last 168 hours.  Urinalysis    Component Value Date/Time   COLORURINE YELLOW 09/14/2015 1344   APPEARANCEUR CLEAR 09/14/2015 1344   LABSPEC 1.009 09/14/2015 1344   PHURINE 6.0 09/14/2015 1344   GLUCOSEU NEGATIVE 09/14/2015 1344   HGBUR TRACE (A) 09/14/2015 1344   BILIRUBINUR NEGATIVE 09/14/2015 1344   KETONESUR NEGATIVE 09/14/2015 1344   PROTEINUR NEGATIVE 09/14/2015 1344   UROBILINOGEN 0.2 06/21/2011 1204   NITRITE NEGATIVE 09/14/2015 1344   LEUKOCYTESUR NEGATIVE 09/14/2015 1344    RADIOLOGY AND OTHER  STUDIES: No results found.   ASSESSMENT: 83 y.o. [place] woman status post right nipple biopsy 03/07/2018, showing Paget's disease of the breast, estrogen and progesterone receptor negative, HER-2 amplified  (1) genetics testing 05/05/2018 through the [facility] Breast Cancer STAT + Common Hereditary Cancers Panel showed no deleterious mutations in ATM, BRCA1, BRCA2, CDH1, CHEK2, PALB2, PTEN, STK11 and TP53.  The Common Hereditary Cancers Panel offered by [facility] includes sequencing and/or deletion duplication testing of the following 47 genes: APC, ATM, AXIN2, BARD1, BMPR1A, BRCA1, BRCA2, BRIP1, CDH1, CDKN2A (p14ARF), CDKN2A (p16INK4a), CKD4, CHEK2, CTNNA1, DICER1, EPCAM (Deletion/duplication testing only), GREM1 (promoter region deletion/duplication testing only), KIT, MEN1, MLH1, MSH2, MSH3, MSH6, MUTYH, NBN, NF1, NHTL1, PALB2, PDGFRA, PMS2, POLD1, POLE, PTEN, RAD50, RAD51C, RAD51D, SDHB, SDHC, SDHD, SMAD4, SMARCA4. STK11, TP53, TSC1, TSC2, and VHL.  The following genes were evaluated for sequence changes only: SDHA and HOXB13 c.251G>A variant only.  (2) status post partial central mastectomy 06/11/2022 showing ductal carcinoma in situ, grade 2 measuring 1.5 cm, with negative margins.  (3) no radiation or anti-HER-2 treatment planned  PLAN: Lilly did remarkably well with her surgery and today we discussed the pathology results.  Ai understands that in noninvasive ductal carcinoma, also called ductal carcinoma in situ ("DCIS") the breast cancer cells remain trapped in the ducts were they started. They cannot travel to a vital organ. For that reason these cancers in themselves are not life-threatening.  If the whole breast is removed then all the ducts are removed and since the cancer cells are trapped in the ducts, the cure rate with mastectomy for noninvasive breast cancer is approximately 99%. Nevertheless we recommended lumpectomy, because there is no survival advantage to mastectomy and  because the cosmetic result is generally superior with breast conservation.  Since she  agreed to that option and decided to keep her breast,, there will be some risk of recurrence. The recurrence can only be in the same breast since, again, the cells are trapped in the ducts. There is no connection from one breast to the other.   The risk of local recurrence is cut by more than half with radiation, which is standard in this situation.  This was the original plan, but when the patient's case was presented 06/25/2020 the full conference the question was raised given her comorbidities and age whether radiation therapy really was in her interest.  It was felt that observation may be a better option for her and that is what we are implementing.  Accordingly she will have mammography in October and then see me shortly thereafter.  We will follow her on a yearly basis in that fashion for 5 years  If there is disease recurrence we can consider adjuvant anti-HER-2 treatment.  The patient and her son have a good understanding of this plan.  They agree with it.  They know to call for any other issue that may develop before the next visit here.  Chauncey Cruel, MD   07/14/2018 5:12 PM Medical Oncology and Hematology [facility] [address] [place], [place] [address] Tel. [phone]    Fax. [phone]  I, Wilburn Mylar, am acting as scribe for Dr. Virgie Dad. Olander Friedl.  I, Lurline Del MD, have reviewed the above documentation for accuracy and completeness, and I agree with the above.

## 2018-07-14 NOTE — Telephone Encounter (Signed)
Gave avs and calendar ° °

## 2018-07-29 ENCOUNTER — Ambulatory Visit: Payer: Medicare Other | Admitting: Cardiology

## 2018-07-29 ENCOUNTER — Encounter (INDEPENDENT_AMBULATORY_CARE_PROVIDER_SITE_OTHER): Payer: Self-pay

## 2018-07-29 ENCOUNTER — Encounter: Payer: Self-pay | Admitting: Cardiology

## 2018-07-29 VITALS — BP 128/78 | HR 54 | Ht 61.0 in | Wt 211.0 lb

## 2018-07-29 DIAGNOSIS — G4733 Obstructive sleep apnea (adult) (pediatric): Secondary | ICD-10-CM | POA: Diagnosis not present

## 2018-07-29 DIAGNOSIS — I10 Essential (primary) hypertension: Secondary | ICD-10-CM

## 2018-07-29 DIAGNOSIS — Z6841 Body Mass Index (BMI) 40.0 and over, adult: Secondary | ICD-10-CM | POA: Diagnosis not present

## 2018-07-29 NOTE — Patient Instructions (Signed)

## 2018-07-29 NOTE — Progress Notes (Signed)
Cardiology Office Note:    Date:  07/29/2018   ID:  [name], [name] [birth year], MRN [id number]  PCP:  Leighton Ruff, MD  Cardiologist:  No primary care provider on file.    Referring MD: Leighton Ruff, MD   Chief Complaint  Patient presents with  . Sleep Apnea  . Hypertension    History of Present Illness:    [name] is a 83 y.o. female with a hx of  OSA, obesity, moderate pulmonary HTN, diastolic dysfunction and HTN. She is doing well with her CPAP device and thinks that she has gotten used to it.  She tolerates the mask and feels the pressure is adequate.  Since going on CPAP she feels rested in the am and has no significant daytime sleepiness.  She denies any significant mouth or nasal dryness or nasal congestion.  She does not think that he snores.     Past Medical History:  Diagnosis Date  . Anemia    takes iron  . Arthritis    Dr Ronnie Derby  . Carotid artery stenosis    1-39% bilateral dopplers 08/2015  . Cataracts, bilateral   . Chronic kidney disease    stage III Dr Mercy Moore  . Diabetes mellitus   . Family history of breast cancer   . Family history of breast cancer   . Family history of melanoma   . GERD (gastroesophageal reflux disease)   . Heart murmur    Dr Irish Lack  . HTN (hypertension)   . Hyperlipidemia   . Morbid obesity with BMI of 40.0-44.9, adult ([place])   . Obstructive sleep apnea (adult) (pediatric)    w CPAP Severe OSA AHI 52/hr CPAP at 10cm H2O  . Pulmonary HTN ([place])    mild with PASP 7mmHg echo 02/2014  . Right carotid bruit 08/19/2014  . Shortness of breath    resolved with CPAP  . Urination frequency   . Vitamin D deficiency disease   . Wears dentures    upper  . Wears glasses     Past Surgical History:  Procedure Laterality Date  . ABDOMINAL HYSTERECTOMY     partial  . APPENDECTOMY    . CARDIOVASCULAR STRESS TEST  2011  . CHOLECYSTECTOMY N/A 10/04/2015   Procedure: LAPAROSCOPIC CHOLECYSTECTOMY;  Surgeon: Coralie Keens, MD;  [facility];  Service: General;  Laterality: N/A;  . COLONOSCOPY  01/15/2012   Procedure: COLONOSCOPY;  Surgeon: Lear Ng, MD;  [facility];  Service: Endoscopy;  Laterality: N/A;  . ESOPHAGOGASTRODUODENOSCOPY  01/15/2012   Procedure: ESOPHAGOGASTRODUODENOSCOPY (EGD);  Surgeon: Lear Ng, MD;  [facility];  Service: Endoscopy;  Laterality: N/A;  . JOINT REPLACEMENT Bilateral    Knees  . left shoulder surgery      rotator cuff tear   . MASTECTOMY, PARTIAL Right 06/11/2018   Procedure: RIGHT BREAST PARTIAL MASTECTOMY;  Surgeon: Coralie Keens, MD;  Location: [facility];  Service: General;  Laterality: Right;  . sleep study  2012  . TOTAL KNEE ARTHROPLASTY     Left  . TOTAL KNEE ARTHROPLASTY  07/02/2011   Procedure: TOTAL KNEE ARTHROPLASTY;  Surgeon: Rudean Haskell, MD;  Location: [facility];  Service: Orthopedics;  Laterality: Right;  . US ECHOCARDIOGRAPHY     2012    Current Medications: Current Meds  Medication Sig  . acetaminophen (TYLENOL) 500 MG tablet Take 500 mg by mouth every 8 (eight) hours as needed for mild pain, moderate pain, fever or headache.   Marland Kitchen  amLODipine (NORVASC) 10 MG tablet Take 10 mg by mouth daily.  Marland Kitchen aspirin EC 81 MG tablet Take 81 mg by mouth daily.  Marland Kitchen atorvastatin (LIPITOR) 40 MG tablet Take 40 mg by mouth at bedtime.  . Cholecalciferol (VITAMIN D) 2000 UNITS CAPS Take 2,000 Units by mouth daily.  Marland Kitchen docusate sodium (COLACE) 100 MG capsule Take 100 mg by mouth daily as needed for mild constipation.  . Esomeprazole Magnesium 20 MG TBEC Take 20 mg by mouth daily.   Marland Kitchen ezetimibe (ZETIA) 10 MG tablet Take 10 mg by mouth at bedtime.  . furosemide (LASIX) 40 MG tablet Take 40 mg by mouth daily.   . Multiple Vitamins-Minerals (MULTIVITAMINS THER. W/MINERALS) TABS Take 1 tablet by mouth daily.  . pioglitazone (ACTOS) 30 MG tablet Take 30 mg by mouth daily.  Marland Kitchen spironolactone (ALDACTONE) 25 MG tablet Take 25 mg by mouth  every other day.   . traMADol (ULTRAM) 50 MG tablet Take 1 tablet (50 mg total) by mouth every 6 (six) hours as needed (mild pain).     Allergies:   Codeine and Ibuprofen   Social History   Socioeconomic History  . Marital status: Widowed    Spouse name: Not on file  . Number of children: Not on file  . Years of education: Not on file  . Highest education level: Not on file  Occupational History  . Not on file  Social Needs  . Financial resource strain: Not on file  . Food insecurity:    Worry: Not on file    Inability: Not on file  . Transportation needs:    Medical: No    Non-medical: No  Tobacco Use  . Smoking status: Never Smoker  . Smokeless tobacco: Never Used  Substance and Sexual Activity  . Alcohol use: No  . Drug use: No  . Sexual activity: Not Currently  Lifestyle  . Physical activity:    Days per week: Not on file    Minutes per session: Not on file  . Stress: Not on file  Relationships  . Social connections:    Talks on phone: Not on file    Gets together: Not on file    Attends religious service: Not on file    Active member of club or organization: Not on file    Attends meetings of clubs or organizations: Not on file    Relationship status: Not on file  Other Topics Concern  . Not on file  Social History Narrative  . Not on file     Family History: The patient's family history includes Breast cancer in her maternal aunt; Diabetes in her father and mother; Melanoma in her cousin.  ROS:   Please see the history of present illness.    ROS  All other systems reviewed and negative.   EKGs/Labs/Other Studies Reviewed:    The following studies were reviewed today: PAP download  EKG:  EKG is not ordered today.    Recent Labs: 07/14/2018: ALT 8; BUN 15; Creatinine, Ser 1.22; Hemoglobin 10.4; Platelets 246; Potassium 4.4; Sodium 144   Recent Lipid Panel No results found for: CHOL, TRIG, HDL, CHOLHDL, VLDL, LDLCALC, LDLDIRECT  Physical Exam:     VS:  BP 128/78   Pulse (!) 54   Ht 5\' 1"  (1.549 m)   Wt 211 lb (95.7 kg)   SpO2 98%   BMI 39.87 kg/m     Wt Readings from Last 3 Encounters:  07/29/18 211 lb (95.7 kg)  07/14/18 213 lb 9.6 oz (96.9 kg)  06/11/18 209 lb 14.1 oz (95.2 kg)     GEN:  Well nourished, well developed in no acute distress HEENT: Normal NECK: No JVD; No carotid bruits LYMPHATICS: No lymphadenopathy CARDIAC: RRR, no murmurs, rubs, gallops RESPIRATORY:  Clear to auscultation without rales, wheezing or rhonchi  ABDOMEN: Soft, non-tender, non-distended MUSCULOSKELETAL:  No edema; No deformity  SKIN: Warm and dry NEUROLOGIC:  Alert and oriented x 3 PSYCHIATRIC:  Normal affect   ASSESSMENT:    1. Obstructive sleep apnea   2. Essential hypertension   3. Morbid obesity with BMI of 40.0-44.9, adult ([place])    PLAN:    In order of problems listed above:  1.  OSA - the patient is tolerating PAP therapy well without any problems. The PAP download was reviewed today and showed an AHI of 0.7/hr on 10 cm H2O with 97% compliance in using more than 4 hours nightly.  The patient has been using and benefiting from PAP use and will continue to benefit from therapy.   2.  HTN - BP controlled on amlodipine 10 mg daily and Spironolactone 25 mg daily.  3.  Obesity - I have encouraged her to get into a routine exercise program and cut back on carbs and portions.    Medication Adjustments/Labs and Tests Ordered: Current medicines are reviewed at length with the patient today.  Concerns regarding medicines are outlined above.  No orders of the defined types were placed in this encounter.  No orders of the defined types were placed in this encounter.   Signed, Fransico Him, MD  07/29/2018 3:04 PM    [place]

## 2019-02-23 ENCOUNTER — Other Ambulatory Visit: Payer: Self-pay | Admitting: *Deleted

## 2019-03-03 ENCOUNTER — Other Ambulatory Visit (HOSPITAL_COMMUNITY): Payer: Self-pay

## 2019-03-03 MED ORDER — SPIRONOLACTONE 25 MG PO TABS: 25.0000 mg | ORAL_TABLET | Freq: Once | ORAL | 0 refills | Status: DC

## 2019-03-25 ENCOUNTER — Ambulatory Visit: Payer: Medicare Other | Admitting: Oncology

## 2019-04-09 ENCOUNTER — Other Ambulatory Visit: Payer: Self-pay

## 2019-04-09 ENCOUNTER — Ambulatory Visit
Admission: RE | Admit: 2019-04-09 | Discharge: 2019-04-09 | Disposition: A | Discharge status: Home / Self Care | Payer: Medicare Other | Source: Ambulatory Visit | Attending: Oncology | Admitting: Oncology

## 2019-04-09 DIAGNOSIS — Z171 Estrogen receptor negative status [ER-]: Secondary | ICD-10-CM

## 2019-04-09 DIAGNOSIS — C50011 Malignant neoplasm of nipple and areola, right female breast: Secondary | ICD-10-CM

## 2019-04-11 NOTE — Progress Notes (Signed)
[place]  Telephone:([phone] Fax:(336) [phone]     ID: [name] DOB: 1935/11/28  MR#: [id number]  LZJ#:[id number]  Patient Care Team: Leighton Ruff, MD as PCP - General (Family Medicine) Lawerance Cruel, MD as Consulting Physician (Family Medicine) Coralie Keens, MD as Consulting Physician (General Surgery) Demari Kropp, Virgie Dad, MD as Consulting Physician (Oncology) Bensimhon, Shaune Pascal, MD as Consulting Physician (Cardiology) Eppie Gibson, MD as Attending Physician (Radiation Oncology) Allyn Kenner, MD (Dermatology) OTHER MD:  CHIEF COMPLAINT: Paget's disease of the right nipple, estrogen receptor negative, HER-2 amplified  CURRENT TREATMENT: observation    INTERVAL HISTORY [name] returns for follow up of Paget's disease of the right nipple. She is accompanied by her daughter.  Since her last visit, she underwent bilateral diagnostic mammography with tomography at [facility] on 04/09/2019 showing: breast density category B; no evidence of malignancy in either breast.   REVIEW OF SYSTEMS: [name] has a normal activity level for her age.  She does most watches combo movies, drives herself around sometimes just to get out, and does grocery shopping.  She is keeping herself very safe regarding the pandemic.  Her daughter takes a shower after coming home from work every day.  The patient has not noted any change in either breast.  She has occasional shooting pains which she understands are common and benign.  Detailed review of systems today was otherwise stable   HISTORY OF PRESENT ILLNESS: From the original intake note:  [name] is a 83 y.o. female with a new diagnosis of Paget's disease of the right nipple.   The patient reports discoloration of the right nipple present for approximately 2 years.  She eventually developed some nipple bleeding and scabbing.  She had bilateral diagnostic mammography with tomography and right  breast ultrasonography at the [facility] 01/04/2017 showing the breast density to be category B.  Mammography and ultrasonography were unremarkable, but on exam the right nipple was discolored, pink and somewhat scabbed.  There was no bleeding or discharge.  As the nipple problem persisted the patient was referred to dermatology and on 03/07/2018 Dr. [name] did a shave biopsy of the right nipple showing ([id number]) Paget's disease of the nipple, estrogen and progesterone receptor negative, but HER-2 amplified by immunohistochemistry (3+).  The patient was then referred to Dr. Ninfa Linden who set her up for repeat mammogram and Breast US done on 04/07/2018.  This showed recently diagnosed Paget's disease of the right nipple. No evidence of malignancy beyond the right nipple and areola. No evidence of metastatic adenopathy. Normal appearance of the left breast.   The patient's subsequent history is as detailed below   PAST MEDICAL HISTORY: Past Medical History:  Diagnosis Date  . Anemia    takes iron  . Arthritis    Dr Ronnie Derby  . Carotid artery stenosis    1-39% bilateral dopplers 08/2015  . Cataracts, bilateral   . Chronic kidney disease    stage III Dr Mercy Moore  . Diabetes mellitus   . Family history of breast cancer   . Family history of breast cancer   . Family history of melanoma   . GERD (gastroesophageal reflux disease)   . Heart murmur    Dr Irish Lack  . HTN (hypertension)   . Hyperlipidemia   . Morbid obesity with BMI of 40.0-44.9, adult ([place])   . Obstructive sleep apnea (adult) (pediatric)    w CPAP Severe OSA AHI 52/hr CPAP at 10cm H2O  .  Pulmonary HTN ([place])    mild with PASP 5mHg echo 02/2014  . Right carotid bruit 08/19/2014  . Shortness of breath    resolved with CPAP  . Urination frequency   . Vitamin D deficiency disease   . Wears dentures    upper  . Wears glasses     PAST SURGICAL HISTORY: Past Surgical History:  Procedure Laterality Date  . ABDOMINAL  HYSTERECTOMY     partial  . APPENDECTOMY    . CARDIOVASCULAR STRESS TEST  2011  . CHOLECYSTECTOMY N/A 10/04/2015   Procedure: LAPAROSCOPIC CHOLECYSTECTOMY;  Surgeon: DCoralie Keens MD;  [facility];  Service: General;  Laterality: N/A;  . COLONOSCOPY  01/15/2012   Procedure: COLONOSCOPY;  Surgeon: VLear Ng MD;  [facility];  Service: Endoscopy;  Laterality: N/A;  . ESOPHAGOGASTRODUODENOSCOPY  01/15/2012   Procedure: ESOPHAGOGASTRODUODENOSCOPY (EGD);  Surgeon: VLear Ng MD;  [facility];  Service: Endoscopy;  Laterality: N/A;  . JOINT REPLACEMENT Bilateral    Knees  . left shoulder surgery      rotator cuff tear   . MASTECTOMY, PARTIAL Right 06/11/2018   Procedure: RIGHT BREAST PARTIAL MASTECTOMY;  Surgeon: BCoralie Keens MD;  [facility]  Service: General;  Laterality: Right;  . sleep study  2012  . TOTAL KNEE ARTHROPLASTY     Left  . TOTAL KNEE ARTHROPLASTY  07/02/2011   Procedure: TOTAL KNEE ARTHROPLASTY;  Surgeon: SRudean Haskell MD;  [facility]  Service: Orthopedics;  Laterality: Right;  . UKoreaECHOCARDIOGRAPHY     2012    FAMILY HISTORY Family History  Problem Relation Age of Onset  . Diabetes Mother   . Diabetes Father   . Breast cancer Maternal Aunt        unkown age of diagnosis  . Melanoma Cousin   Her father was diabetic, and died after a fall in his early 933s Her mother was also a diabetic, died in her 866sdue to diabetic complications. The patient has one full brother and 518half brothers. She has 2 full sisters, one passed away from tonic obstructive pulmonary disease at age 81  The patient also had 2 half-sisters, one half sister died from a heart disease.  3 out of 6 maternal aunts had breast cancer. She denies a family history of ovarian cancer.   GYNECOLOGIC HISTORY:  Menarche: 89YO First child: age [age]GXP2  Hysterectomy in her 351s She took hormone replacement for almost 30 years. 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 She lives with her daughter BTheadora Ramaand 2 granddaughters, aged 281(a [employer] teaching in pregnancy centers), and 25 (completed college but not currently working). Her daughter BJacky Kindleworks at [employer] Her son MCharlean Sanf[place]ives in [place] has 2 children.  The patient has 2 great-grandchildren.  She is a member of a local [place]no information>   HEALTH MAINTENANCE: Social History   Tobacco Use  . Smoking status: Never Smoker  . Smokeless tobacco: Never Used  Substance Use Topics  . Alcohol use: No  . Drug use: No     Colonoscopy: at [facility]  PAP:  Bone density: Never  Lipid panel:   Allergies  Allergen Reactions  . Codeine Itching  . Ibuprofen Other (See Comments)    "gave her kidney trouble"   Current Outpatient Medications  Medication Sig Dispense Refill  . acetaminophen (TYLENOL) 500  MG tablet Take 500 mg by mouth every 8 (eight) hours as needed for mild pain, moderate pain, fever or headache.     Marland Kitchen amLODipine (NORVASC) 10 MG tablet Take 10 mg by mouth daily.  6  . aspirin EC 81 MG tablet Take 81 mg by mouth daily.    Marland Kitchen atorvastatin (LIPITOR) 40 MG tablet Take 40 mg by mouth at bedtime.    . Cholecalciferol (VITAMIN D) 2000 UNITS CAPS Take 2,000 Units by mouth daily.    Marland Kitchen docusate sodium (COLACE) 100 MG capsule Take 100 mg by mouth daily as needed for mild constipation.    . Esomeprazole Magnesium 20 MG TBEC Take 20 mg by mouth daily.     Marland Kitchen ezetimibe (ZETIA) 10 MG tablet Take 10 mg by mouth at bedtime.    . furosemide (LASIX) 40 MG tablet Take 40 mg by mouth daily.     . Multiple Vitamins-Minerals (MULTIVITAMINS THER. W/MINERALS) TABS Take 1 tablet by mouth daily.    . pioglitazone (ACTOS) 30 MG tablet Take 30 mg by mouth daily.    Marland Kitchen spironolactone (ALDACTONE) 25 MG tablet Take 1 tablet (25 mg total) by mouth once for 1 dose. 1 tablet 0  . traMADol (ULTRAM)  50 MG tablet Take 1 tablet (50 mg total) by mouth every 6 (six) hours as needed (mild pain). 25 tablet 0   No current facility-administered medications for this visit.     OBJECTIVE: Morbidly obese African-American woman who appears stated age  83:   04/13/19 0844  BP: (!) 155/64  Pulse: (!) 58  Resp: 17  Temp: 99.1 F (37.3 C)  SpO2: 100%     Body mass index is 39.83 kg/m.    ECOG FS:1 - Symptomatic but completely ambulatory Vitals:   04/13/19 0844  BP: (!) 155/64  Pulse: (!) 58  Resp: 17  Temp: 99.1 F (37.3 C)  TempSrc: Temporal  SpO2: 100%  Weight: 210 lb 12.8 oz (95.6 kg)  Height: _0  (1.549 m)    Sclerae unicteric, EOMs intact Wearing a mask No cervical or supraclavicular adenopathy Lungs no rales or rhonchi Heart regular rate and rhythm Abd soft, nontender, positive bowel sounds MSK no focal spinal tenderness, no upper extremity lymphedema Neuro: nonfocal, well oriented, appropriate affect Breasts: The right breast is status post central lumpectomy.  There is no evidence of local recurrence.  The left breast is benign.  Both axillae are benign.    LAB RESULTS:  CMP     Component Value Date/Time   NA 144 07/14/2018 1500   K 4.4 07/14/2018 1500   CL 110 07/14/2018 1500   CO2 25 07/14/2018 1500   GLUCOSE 149 (H) 07/14/2018 1500   BUN 15 07/14/2018 1500   CREATININE 1.22 (H) 07/14/2018 1500   CREATININE 1.33 (H) 04/08/2018 1549   CALCIUM 9.7 07/14/2018 1500   PROT 7.5 07/14/2018 1500   ALBUMIN 3.7 07/14/2018 1500   AST 16 07/14/2018 1500   AST 18 04/08/2018 1549   ALT 8 07/14/2018 1500   ALT 12 04/08/2018 1549   ALKPHOS 82 07/14/2018 1500   BILITOT 0.5 07/14/2018 1500   BILITOT 0.5 04/08/2018 1549   GFRNONAA 42 (L) 07/14/2018 1500   GFRNONAA 37 (L) 04/08/2018 1549   GFRAA 48 (L) 07/14/2018 1500   GFRAA 43 (L) 04/08/2018 1549    No results found for: KPAFRELGTCHN, LAMBDASER, KAPLAMBRATIO  No results found for: TOTALPROTELP, ALBUMINELP,  A1GS, A2GS, BETS, BETA2SER, GAMS, MSPIKE, SPEI  Lab Results  Component Value Date   WBC 4.4 07/14/2018   NEUTROABS 2.6 07/14/2018   HGB 10.4 (L) 07/14/2018   HCT 33.1 (L) 07/14/2018   MCV 89.5 07/14/2018   PLT 246 07/14/2018    _0 @  No results found for: LABCA2  No components found for: IEPPI951  No results for input(s): INR in the last 168 hours.  Urinalysis    Component Value Date/Time   COLORURINE YELLOW 09/14/2015 1344   APPEARANCEUR CLEAR 09/14/2015 1344   LABSPEC 1.009 09/14/2015 1344   PHURINE 6.0 09/14/2015 1344   GLUCOSEU NEGATIVE 09/14/2015 1344   HGBUR TRACE (A) 09/14/2015 1344   BILIRUBINUR NEGATIVE 09/14/2015 1344   KETONESUR NEGATIVE 09/14/2015 1344   PROTEINUR NEGATIVE 09/14/2015 1344   UROBILINOGEN 0.2 06/21/2011 1204   NITRITE NEGATIVE 09/14/2015 1344   LEUKOCYTESUR NEGATIVE 09/14/2015 1344    RADIOLOGY AND OTHER STUDIES: Mm Diag Breast Tomo Bilateral  Result Date: 04/09/2019 CLINICAL DATA:  Annual exam. History of right breast Paget's disease status post lumpectomy. EXAM: DIGITAL DIAGNOSTIC BILATERAL MAMMOGRAM WITH CAD AND TOMO COMPARISON:  Previous exam(s). ACR Breast Density Category b: There are scattered areas of fibroglandular density. FINDINGS: Right breast: Postsurgical changes at the nipple. No suspicious mass, distortion, or microcalcifications are identified to suggest presence of malignancy. Left breast: No suspicious mass, distortion, or microcalcifications are identified to suggest presence of malignancy. Mammographic images were processed with CAD. IMPRESSION: No mammographic evidence of malignancy. Postsurgical changes at the right nipple. RECOMMENDATION: Diagnostic bilateral mammogram in 1 year. I have discussed the findings and recommendations with the patient. If applicable, a reminder letter will be sent to the patient regarding the next appointment. BI-RADS CATEGORY  2: Benign. Electronically Signed   By: Audie Pinto M.D.    On: 04/09/2019 09:34     ASSESSMENT: 83 y.o. [place] woman status post right nipple biopsy 03/07/2018, showing Paget's disease of the breast, estrogen and progesterone receptor negative, HER-2 amplified  (1) genetics testing 05/05/2018 through the [facility] Breast Cancer STAT + Common Hereditary Cancers Panel showed no deleterious mutations in ATM, BRCA1, BRCA2, CDH1, CHEK2, PALB2, PTEN, STK11 and TP53.  The Common Hereditary Cancers Panel offered by [facility] includes sequencing and/or deletion duplication testing of the following 47 genes: APC, ATM, AXIN2, BARD1, BMPR1A, BRCA1, BRCA2, BRIP1, CDH1, CDKN2A (p14ARF), CDKN2A (p16INK4a), CKD4, CHEK2, CTNNA1, DICER1, EPCAM (Deletion/duplication testing only), GREM1 (promoter region deletion/duplication testing only), KIT, MEN1, MLH1, MSH2, MSH3, MSH6, MUTYH, NBN, NF1, NHTL1, PALB2, PDGFRA, PMS2, POLD1, POLE, PTEN, RAD50, RAD51C, RAD51D, SDHB, SDHC, SDHD, SMAD4, SMARCA4. STK11, TP53, TSC1, TSC2, and VHL.  The following genes were evaluated for sequence changes only: SDHA and HOXB13 c.251G>A variant only.  (2) status post partial central mastectomy 06/11/2018 showing ductal carcinoma in situ, grade 2 measuring 1.5 cm, with negative margins.  (3) no radiation or anti-HER-2 treatment planned  PLAN: [name] is now close to a year out from definitive surgery for her breast cancer with no evidence of recurrence.  This is very favorable.  We are following her with observation alone.  She just had her mammogram which shows a low density breast and no evidence of recurrence or residual disease.  She will see her surgeon Dr. Ninfa Linden in about 6 months.  She will return to see me in a little over a year, after her next mammogram  She knows to call for any other issue that may develop before that visit.   Chauncey Cruel, MD   04/13/2019 8:59 AM [facility]  [place], [place] [address] Tel.  [phone]    Fax. [phone]   I, Wilburn Mylar, am acting as scribe for Dr. Virgie Dad. Keely Drennan.  I, Lurline Del MD, have reviewed the above documentation for accuracy and completeness, and I agree with the above.

## 2019-04-13 ENCOUNTER — Other Ambulatory Visit: Payer: Self-pay

## 2019-04-13 ENCOUNTER — Inpatient Hospital Stay: Payer: Medicare Other | Attending: Oncology | Admitting: Oncology

## 2019-04-13 VITALS — BP 155/64 | HR 58 | Temp 99.1°F | Resp 17 | Ht 61.0 in | Wt 210.8 lb

## 2019-04-13 DIAGNOSIS — Z86000 Personal history of in-situ neoplasm of breast: Secondary | ICD-10-CM | POA: Insufficient documentation

## 2019-04-13 DIAGNOSIS — Z90710 Acquired absence of both cervix and uterus: Secondary | ICD-10-CM | POA: Diagnosis not present

## 2019-04-13 DIAGNOSIS — Z803 Family history of malignant neoplasm of breast: Secondary | ICD-10-CM | POA: Insufficient documentation

## 2019-04-13 DIAGNOSIS — Z808 Family history of malignant neoplasm of other organs or systems: Secondary | ICD-10-CM | POA: Diagnosis not present

## 2019-04-13 DIAGNOSIS — C50011 Malignant neoplasm of nipple and areola, right female breast: Secondary | ICD-10-CM | POA: Diagnosis not present

## 2019-04-13 DIAGNOSIS — Z9011 Acquired absence of right breast and nipple: Secondary | ICD-10-CM | POA: Diagnosis not present

## 2019-04-13 DIAGNOSIS — I10 Essential (primary) hypertension: Secondary | ICD-10-CM | POA: Diagnosis not present

## 2019-04-13 DIAGNOSIS — E119 Type 2 diabetes mellitus without complications: Secondary | ICD-10-CM | POA: Diagnosis not present

## 2019-04-13 DIAGNOSIS — Z171 Estrogen receptor negative status [ER-]: Secondary | ICD-10-CM | POA: Diagnosis not present

## 2019-04-14 ENCOUNTER — Telehealth: Payer: Self-pay | Admitting: Oncology

## 2019-04-14 NOTE — Telephone Encounter (Signed)
I talk with patient regarding schedule  

## 2019-04-22 ENCOUNTER — Other Ambulatory Visit (HOSPITAL_COMMUNITY): Payer: Self-pay

## 2019-04-22 MED ORDER — SPIRONOLACTONE 25 MG PO TABS: 25.0000 mg | ORAL_TABLET | Freq: Once | ORAL | 0 refills | Status: DC

## 2019-04-23 ENCOUNTER — Other Ambulatory Visit (HOSPITAL_COMMUNITY): Payer: Self-pay

## 2019-04-23 MED ORDER — SPIRONOLACTONE 25 MG PO TABS: 25.0000 mg | ORAL_TABLET | Freq: Once | ORAL | 0 refills | Status: DC

## 2019-07-02 ENCOUNTER — Ambulatory Visit: Payer: Medicare Other

## 2019-07-09 ENCOUNTER — Ambulatory Visit: Payer: Medicare Other | Attending: Internal Medicine

## 2019-07-09 DIAGNOSIS — Z23 Encounter for immunization: Secondary | ICD-10-CM

## 2019-07-09 NOTE — Progress Notes (Signed)
   Covid-19 Vaccination Clinic  Name:  [name]    MRN: [id number] DOB: [birth year]  07/09/2019  [name] was observed post Covid-19 immunization for 15 minutes without incidence. She was provided with Vaccine Information Sheet and instruction to access the V-Safe system.   [name] was instructed to call 911 with any severe reactions post vaccine: Marland Kitchen Difficulty breathing  . Swelling of your face and throat  . A fast heartbeat  . A bad rash all over your body  . Dizziness and weakness    Immunizations Administered    Name Date Dose VIS Date Route   Pfizer COVID-19 Vaccine 07/09/2019  9:14 AM 0.3 mL 05/15/2019 Intramuscular   Manufacturer: [place]   Lot: [id number]   [place]: [address]-1

## 2019-07-14 ENCOUNTER — Telehealth (HOSPITAL_COMMUNITY): Payer: Self-pay

## 2019-07-14 MED ORDER — SPIRONOLACTONE 25 MG PO TABS: 25.0000 mg | ORAL_TABLET | Freq: Every day | ORAL | 0 refills | Status: DC

## 2019-07-14 NOTE — Telephone Encounter (Signed)
Please contact patient for an appointment with DB last seen 2019

## 2019-07-15 ENCOUNTER — Telehealth (HOSPITAL_COMMUNITY): Payer: Self-pay | Admitting: Vascular Surgery

## 2019-07-15 NOTE — Telephone Encounter (Signed)
Left pt VM to call back to make appt w/ db next ava is fine, pt has not been seen since 2019

## 2019-07-28 ENCOUNTER — Telehealth: Payer: Self-pay | Admitting: *Deleted

## 2019-07-28 NOTE — Telephone Encounter (Signed)

## 2019-07-31 ENCOUNTER — Encounter: Payer: Self-pay | Admitting: Cardiology

## 2019-07-31 ENCOUNTER — Telehealth (INDEPENDENT_AMBULATORY_CARE_PROVIDER_SITE_OTHER): Payer: Medicare Other | Admitting: Cardiology

## 2019-07-31 ENCOUNTER — Other Ambulatory Visit: Payer: Self-pay

## 2019-07-31 ENCOUNTER — Telehealth: Payer: Self-pay | Admitting: *Deleted

## 2019-07-31 VITALS — BP 140/66 | HR 50 | Ht 61.0 in | Wt 207.0 lb

## 2019-07-31 DIAGNOSIS — G4733 Obstructive sleep apnea (adult) (pediatric): Secondary | ICD-10-CM

## 2019-07-31 DIAGNOSIS — I10 Essential (primary) hypertension: Secondary | ICD-10-CM

## 2019-07-31 DIAGNOSIS — Z6839 Body mass index (BMI) 39.0-39.9, adult: Secondary | ICD-10-CM

## 2019-07-31 DIAGNOSIS — Z6841 Body Mass Index (BMI) 40.0 and over, adult: Secondary | ICD-10-CM

## 2019-07-31 NOTE — Telephone Encounter (Signed)
-----   Message from Antonieta Iba, [place] sent at 07/31/2019 11:09 AM EST ----- From Dr. Radford Pax:   "Drier - followup with me in 1 year - send order to [facility] for new PAP supplies"  I have put in the 1 year FU recall.  Thanks!Carly

## 2019-07-31 NOTE — Progress Notes (Signed)
Virtual Visit via Telephone Note   This visit type was conducted due to national recommendations for restrictions regarding the COVID-19 Pandemic (e.g. social distancing) in an effort to limit this patient's exposure and mitigate transmission in our community.  Due to her co-morbid illnesses, this patient is at least at moderate risk for complications without adequate follow up.  This format is felt to be most appropriate for this patient at this time.  The patient did not have access to video technology/had technical difficulties with video requiring transitioning to audio format only (telephone).  All issues noted in this document were discussed and addressed.  No physical exam could be performed with this format.  Please refer to the patient's chart for her  consent to telehealth for [facility].   Evaluation Performed:  Follow-up visit  This visit type was conducted due to national recommendations for restrictions regarding the COVID-19 Pandemic (e.g. social distancing).  This format is felt to be most appropriate for this patient at this time.  All issues noted in this document were discussed and addressed.  No physical exam was performed (except for noted visual exam findings with Video Visits).  Please refer to the patient's chart (MyChart message for video visits and phone note for telephone visits) for the patient's consent to telehealth for [facility].  Date:  07/31/2019   ID:  Tiffany, Olsen March 31, 1936, MRN [id number]  Patient Location:  Home  Provider location:   [place]  PCP:  Sueanne Margarita, MD  Sleep medicine:  Fransico Him, MD Electrophysiologist:  None   Chief Complaint:  OSA, HTN  History of Present Illness:    [name] is a 84 y.o. female who presents via audio/video conferencing for a telehealth visit today.    [name] is a 84y.o. female with a hx of OSA, obesity, moderate pulmonary HTN, diastolic dysfunction and HTN. She is doing  well with her CPAP device and thinks that she has gotten used to it.  She tolerates the nasal mask and feels the pressure is adequate.  Since going on CPAP she feels rested in the am and has no significant daytime sleepiness.  She denies any significant mouth or nasal dryness or nasal congestion.  She does not think that he snores.    The patient does not have symptoms concerning for COVID-19 infection (fever, chills, cough, or new shortness of breath).   Prior CV studies:   The following studies were reviewed today:  PAP compliance download from [facility] and DME  Past Medical History:  Diagnosis Date  . Anemia    takes iron  . Arthritis    Dr Ronnie Derby  . Carotid artery stenosis    1-39% bilateral dopplers 08/2015  . Cataracts, bilateral   . Chronic kidney disease    stage III Dr Mercy Moore  . Diabetes mellitus   . Family history of breast cancer   . Family history of breast cancer   . Family history of melanoma   . GERD (gastroesophageal reflux disease)   . Heart murmur    Dr Irish Lack  . HTN (hypertension)   . Hyperlipidemia   . Morbid obesity with BMI of 40.0-44.9, adult ([place])   . Obstructive sleep apnea (adult) (pediatric)    w CPAP Severe OSA AHI 52/hr CPAP at 10cm H2O  . Pulmonary HTN ([place])    mild with PASP 76mmHg echo 02/2014  . Right carotid bruit 08/19/2014  . Shortness of breath    resolved  with CPAP  . Urination frequency   . Vitamin D deficiency disease   . Wears dentures    upper  . Wears glasses    Past Surgical History:  Procedure Laterality Date  . ABDOMINAL HYSTERECTOMY     partial  . APPENDECTOMY    . CARDIOVASCULAR STRESS TEST  2011  . CHOLECYSTECTOMY N/A 10/04/2015   Procedure: LAPAROSCOPIC CHOLECYSTECTOMY;  Surgeon: Coralie Keens, MD;  [facility];  Service: General;  Laterality: N/A;  . COLONOSCOPY  01/15/2012   Procedure: COLONOSCOPY;  Surgeon: Lear Ng, MD;  [facility];  Service: Endoscopy;  Laterality: N/A;  .  ESOPHAGOGASTRODUODENOSCOPY  01/15/2012   Procedure: ESOPHAGOGASTRODUODENOSCOPY (EGD);  Surgeon: Lear Ng, MD;  [facility];  Service: Endoscopy;  Laterality: N/A;  . JOINT REPLACEMENT Bilateral    Knees  . left shoulder surgery      rotator cuff tear   . MASTECTOMY, PARTIAL Right 06/11/2018   Procedure: RIGHT BREAST PARTIAL MASTECTOMY;  Surgeon: Coralie Keens, MD;  Location: [facility];  Service: General;  Laterality: Right;  . sleep study  2012  . TOTAL KNEE ARTHROPLASTY     Left  . TOTAL KNEE ARTHROPLASTY  07/02/2011   Procedure: TOTAL KNEE ARTHROPLASTY;  Surgeon: Rudean Haskell, MD;  Location: [facility];  Service: Orthopedics;  Laterality: Right;  . US ECHOCARDIOGRAPHY     2012     Current Meds  Medication Sig  . acetaminophen (TYLENOL) 500 MG tablet Take 500 mg by mouth every 8 (eight) hours as needed for mild pain, moderate pain, fever or headache.   Marland Kitchen amLODipine (NORVASC) 10 MG tablet Take 10 mg by mouth daily.  Marland Kitchen aspirin EC 81 MG tablet Take 81 mg by mouth daily.  Marland Kitchen atorvastatin (LIPITOR) 40 MG tablet Take 40 mg by mouth at bedtime.  . Cholecalciferol (VITAMIN D) 2000 UNITS CAPS Take 2,000 Units by mouth daily.  Marland Kitchen docusate sodium (COLACE) 100 MG capsule Take 100 mg by mouth daily as needed for mild constipation.  . Esomeprazole Magnesium 20 MG TBEC Take 20 mg by mouth daily.   Marland Kitchen ezetimibe (ZETIA) 10 MG tablet Take 10 mg by mouth at bedtime.  . furosemide (LASIX) 40 MG tablet Take 40 mg by mouth daily.   . Multiple Vitamins-Minerals (MULTIVITAMINS THER. W/MINERALS) TABS Take 1 tablet by mouth daily.  . pioglitazone (ACTOS) 30 MG tablet Take 30 mg by mouth daily.  Marland Kitchen spironolactone (ALDACTONE) 25 MG tablet Take 25 mg by mouth every other day.     Allergies:   Codeine and Ibuprofen   Social History   Tobacco Use  . Smoking status: Never Smoker  . Smokeless tobacco: Never Used  Substance Use Topics  . Alcohol use: No  . Drug use: No     Family Hx: The  patient's family history includes Breast cancer in her maternal aunt; Diabetes in her father and mother; Melanoma in her cousin.  ROS:   Please see the history of present illness.     All other systems reviewed and are negative.   Labs/Other Tests and Data Reviewed:    Recent Labs: No results found for requested labs within last 8760 hours.   Recent Lipid Panel No results found for: CHOL, TRIG, HDL, CHOLHDL, LDLCALC, LDLDIRECT  Wt Readings from Last 3 Encounters:  07/31/19 207 lb (93.9 kg)  04/13/19 210 lb 12.8 oz (95.6 kg)  07/29/18 211 lb (95.7 kg)     Objective:    Vital Signs:  BP 140/66   Pulse (!) 50   Ht 5\' 1"  (1.549 m)   Wt 207 lb (93.9 kg)   BMI 39.11 kg/m     ASSESSMENT & PLAN:    1.  OSA - The patient is tolerating PAP therapy well without any problems. The PAP download was reviewed today and showed an AHI of 0.4/hr on 10 cm H2O with 73% compliance in using more than 4 hours nightly.  The patient has been using and benefiting from PAP use and will continue to benefit from therapy.   2.  HTN -BP controlled -continue amlodipine 10mg  daily and Spiro 25mg  daily  3. Morbid Obesity -she has lost about 5lbs. -I have encouraged her to get into a routine exercise program and cut back on carbs and portions.  -encourage her to eat more fruits and veggies   COVID-19 Education: The signs and symptoms of COVID-19 were discussed with the patient and how to seek care for testing (follow up with PCP or arrange E-visit).  The importance of social distancing was discussed today.  Patient Risk:   After full review of this patient's clinical status, I feel that they are at least moderate risk at this time.  Time:   Today, I have spent 20 minutes on telemedicine discussing medical problems including OSA< HTN, Obesity and reviewing patient's chart including PAP compliance download from DME on [facility].  Medication Adjustments/Labs and Tests Ordered: Current medicines are  reviewed at length with the patient today.  Concerns regarding medicines are outlined above.  Tests Ordered: No orders of the defined types were placed in this encounter.  Medication Changes: No orders of the defined types were placed in this encounter.   Disposition:  Follow up in 1 year(s)  Signed, Fransico Him, MD  07/31/2019 10:59 AM    [facility]

## 2019-07-31 NOTE — Telephone Encounter (Signed)
Order placed via community message to [facility].

## 2019-08-04 ENCOUNTER — Ambulatory Visit: Payer: Medicare Other | Attending: Internal Medicine

## 2019-08-04 DIAGNOSIS — Z23 Encounter for immunization: Secondary | ICD-10-CM | POA: Insufficient documentation

## 2019-08-04 NOTE — Progress Notes (Signed)
   Covid-19 Vaccination Clinic  Name:  [name]    MRN: [id number] DOB: 1935/09/01  08/04/2019  [name] was observed post Covid-19 immunization for 15 minutes without incident. She was provided with Vaccine Information Sheet and instruction to access the V-Safe system.   [name] was instructed to call 911 with any severe reactions post vaccine: Marland Kitchen Difficulty breathing  . Swelling of face and throat  . A fast heartbeat  . A bad rash all over body  . Dizziness and weakness   Immunizations Administered    Name Date Dose VIS Date Route   Pfizer COVID-19 Vaccine 08/04/2019 10:25 AM 0.3 mL 05/15/2019 Intramuscular   Manufacturer: Liborio Negron Torres   Lot: [id number]   [place]: [address]-5

## 2019-08-17 ENCOUNTER — Encounter (HOSPITAL_COMMUNITY): Payer: Self-pay | Admitting: Internal Medicine

## 2019-08-17 ENCOUNTER — Ambulatory Visit (HOSPITAL_COMMUNITY)
Admission: RE | Admit: 2019-08-17 | Discharge: 2019-08-17 | Disposition: A | Discharge status: Home / Self Care | Payer: Medicare Other | Source: Ambulatory Visit | Attending: Internal Medicine | Admitting: Internal Medicine

## 2019-08-17 ENCOUNTER — Other Ambulatory Visit: Payer: Self-pay

## 2019-08-17 VITALS — BP 134/58 | HR 58 | Wt 209.8 lb

## 2019-08-17 DIAGNOSIS — Z6841 Body Mass Index (BMI) 40.0 and over, adult: Secondary | ICD-10-CM | POA: Diagnosis not present

## 2019-08-17 DIAGNOSIS — M1990 Unspecified osteoarthritis, unspecified site: Secondary | ICD-10-CM | POA: Diagnosis not present

## 2019-08-17 DIAGNOSIS — J9611 Chronic respiratory failure with hypoxia: Secondary | ICD-10-CM | POA: Insufficient documentation

## 2019-08-17 DIAGNOSIS — Z6839 Body mass index (BMI) 39.0-39.9, adult: Secondary | ICD-10-CM | POA: Diagnosis not present

## 2019-08-17 DIAGNOSIS — I5032 Chronic diastolic (congestive) heart failure: Secondary | ICD-10-CM | POA: Insufficient documentation

## 2019-08-17 DIAGNOSIS — K219 Gastro-esophageal reflux disease without esophagitis: Secondary | ICD-10-CM | POA: Diagnosis not present

## 2019-08-17 DIAGNOSIS — N183 Chronic kidney disease, stage 3 unspecified: Secondary | ICD-10-CM | POA: Insufficient documentation

## 2019-08-17 DIAGNOSIS — E785 Hyperlipidemia, unspecified: Secondary | ICD-10-CM | POA: Diagnosis not present

## 2019-08-17 DIAGNOSIS — Z886 Allergy status to analgesic agent status: Secondary | ICD-10-CM | POA: Insufficient documentation

## 2019-08-17 DIAGNOSIS — Z853 Personal history of malignant neoplasm of breast: Secondary | ICD-10-CM | POA: Insufficient documentation

## 2019-08-17 DIAGNOSIS — Z833 Family history of diabetes mellitus: Secondary | ICD-10-CM | POA: Diagnosis not present

## 2019-08-17 DIAGNOSIS — Z7982 Long term (current) use of aspirin: Secondary | ICD-10-CM | POA: Insufficient documentation

## 2019-08-17 DIAGNOSIS — Z7984 Long term (current) use of oral hypoglycemic drugs: Secondary | ICD-10-CM | POA: Insufficient documentation

## 2019-08-17 DIAGNOSIS — E1122 Type 2 diabetes mellitus with diabetic chronic kidney disease: Secondary | ICD-10-CM | POA: Diagnosis not present

## 2019-08-17 DIAGNOSIS — G4733 Obstructive sleep apnea (adult) (pediatric): Secondary | ICD-10-CM | POA: Insufficient documentation

## 2019-08-17 DIAGNOSIS — E559 Vitamin D deficiency, unspecified: Secondary | ICD-10-CM | POA: Insufficient documentation

## 2019-08-17 DIAGNOSIS — Z885 Allergy status to narcotic agent status: Secondary | ICD-10-CM | POA: Diagnosis not present

## 2019-08-17 DIAGNOSIS — I130 Hypertensive heart and chronic kidney disease with heart failure and stage 1 through stage 4 chronic kidney disease, or unspecified chronic kidney disease: Secondary | ICD-10-CM | POA: Diagnosis not present

## 2019-08-17 DIAGNOSIS — Z79899 Other long term (current) drug therapy: Secondary | ICD-10-CM | POA: Insufficient documentation

## 2019-08-17 DIAGNOSIS — I272 Pulmonary hypertension, unspecified: Secondary | ICD-10-CM | POA: Insufficient documentation

## 2019-08-17 DIAGNOSIS — Z803 Family history of malignant neoplasm of breast: Secondary | ICD-10-CM | POA: Insufficient documentation

## 2019-08-17 NOTE — Patient Instructions (Signed)
Please call our office in September to schedule your echocardiogram and follow up appointments  If you have any questions or concerns before your next appointment please send [place] a message through [place] or call our office at [phone].  At the [facility], you and your health needs are our priority. As part of our continuing mission to provide you with exceptional heart care, we have created designated Provider Care Teams. These Care Teams include your primary Cardiologist (physician) and Advanced Practice Providers (APPs- Physician Assistants and Nurse Practitioners) who all work together to provide you with the care you need, when you need it.   You may see any of the following providers on your designated Care Team at your next follow up: Marland Kitchen Dr Glori Bickers . Dr Loralie Champagne . Darrick Grinder, NP . Lyda Jester, PA . Audry Riles, PharmD   Please be sure to bring in all your medications bottles to every appointment.

## 2019-08-17 NOTE — Progress Notes (Signed)
ADVANCED HF CLINIC NOTE   Primary Cardiologist: Radford Pax  HPI:  [name] is a 84 y.o. female with a history of HTN, DM2, CKD, diastolic HF, OSA, obesity and moderate pulmonary HTN. She denies any h/o known CAD or other heart disease. Non smoker.   She was referred by Dr. Radford Pax for evaluation of [place].   10/18 Hi-res Chest CT. No ILD.   Saw Dr. Radford Pax earlier this week. Excellent CPAP results. The PAP download showed an AHI of 0.4/hr on 10 cm H2O with 73% compliance in using more than 4 hours nightly.   Had surgery for breast CA. No chemo or XRT.   Here for routine f/u. Doing well with ADLs. Able to go to the store and walks slowly. Walks with cane. No dizziness, CP. Mild DOE but doesn't limit you. Mild ankle edema.   Echo 11/19 EF 60-65% RVSP 64 RV ok. Personally reviewed   ECHO 12/18 LVEF 61-95% Grade II diastolic dysfunction RV ok RVSP 42mmHG  ECHO 09/20/16 LVEF 09-32% Grade II diastolic dysfunction RV ok RVSP 59   PFTs 11/01/2016  FEV1 0.93 (67%) FVC  1.36 (75%)  DLCO 55%   Past Medical History:  Diagnosis Date  . Anemia    takes iron  . Arthritis    Dr Ronnie Derby  . Carotid artery stenosis    1-39% bilateral dopplers 08/2015  . Cataracts, bilateral   . Chronic kidney disease    stage III Dr Mercy Moore  . Diabetes mellitus   . Family history of breast cancer   . Family history of breast cancer   . Family history of melanoma   . GERD (gastroesophageal reflux disease)   . Heart murmur    Dr Irish Lack  . HTN (hypertension)   . Hyperlipidemia   . Morbid obesity with BMI of 40.0-44.9, adult ([place])   . Obstructive sleep apnea (adult) (pediatric)    w CPAP Severe OSA AHI 52/hr CPAP at 10cm H2O  . Pulmonary HTN ([place])    mild with PASP 60mmHg echo 02/2014  . Right carotid bruit 08/19/2014  . Shortness of breath    resolved with CPAP  . Urination frequency   . Vitamin D deficiency disease   . Wears dentures    upper  . Wears glasses     Current Outpatient  Medications  Medication Sig Dispense Refill  . acetaminophen (TYLENOL) 500 MG tablet Take 500 mg by mouth every 8 (eight) hours as needed for mild pain, moderate pain, fever or headache.     Marland Kitchen amLODipine (NORVASC) 10 MG tablet Take 10 mg by mouth daily.  6  . aspirin EC 81 MG tablet Take 81 mg by mouth daily.    Marland Kitchen atorvastatin (LIPITOR) 40 MG tablet Take 40 mg by mouth at bedtime.    . Cholecalciferol (VITAMIN D) 2000 UNITS CAPS Take 2,000 Units by mouth daily.    Marland Kitchen docusate sodium (COLACE) 100 MG capsule Take 100 mg by mouth daily as needed for mild constipation.    . Esomeprazole Magnesium 20 MG TBEC Take 20 mg by mouth daily.     Marland Kitchen ezetimibe (ZETIA) 10 MG tablet Take 10 mg by mouth at bedtime.    . furosemide (LASIX) 40 MG tablet Take 40 mg by mouth daily.     . Multiple Vitamins-Minerals (MULTIVITAMINS THER. W/MINERALS) TABS Take 1 tablet by mouth daily.    . pioglitazone (ACTOS) 30 MG tablet Take 30 mg by mouth daily.    Marland Kitchen spironolactone (ALDACTONE)  25 MG tablet Take 25 mg by mouth every other day.     No current facility-administered medications for this encounter.    Allergies  Allergen Reactions  . Codeine Itching  . Ibuprofen Other (See Comments)    "gave her kidney trouble"      Social History   Socioeconomic History  . Marital status: Widowed    Spouse name: Not on file  . Number of children: Not on file  . Years of education: Not on file  . Highest education level: Not on file  Occupational History  . Not on file  Tobacco Use  . Smoking status: Never Smoker  . Smokeless tobacco: Never Used  Substance and Sexual Activity  . Alcohol use: No  . Drug use: No  . Sexual activity: Not Currently  Other Topics Concern  . Not on file  Social History Narrative  . Not on file   Social Determinants of Health   Financial Resource Strain:   . Difficulty of Paying Living Expenses:   Food Insecurity:   . Worried About Charity fundraiser in the Last Year:   . Youth worker in the Last Year:   Transportation Needs:   . Film/video editor (Medical):   Marland Kitchen Lack of Transportation (Non-Medical):   Physical Activity:   . Days of Exercise per Week:   . Minutes of Exercise per Session:   Stress:   . Feeling of Stress :   Social Connections:   . Frequency of Communication with Friends and Family:   . Frequency of Social Gatherings with Friends and Family:   . Attends Religious Services:   . Active Member of Clubs or Organizations:   . Attends Archivist Meetings:   Marland Kitchen Marital Status:   Intimate Partner Violence:   . Fear of Current or Ex-Partner:   . Emotionally Abused:   Marland Kitchen Physically Abused:   . Sexually Abused:       Family History  Problem Relation Age of Onset  . Diabetes Mother   . Diabetes Father   . Breast cancer Maternal Aunt        unkown age of diagnosis  . Melanoma Cousin     Vitals:   08/17/19 1439  BP: (!) 134/58  Pulse: (!) 58  SpO2: 100%  Weight: 95.2 kg (209 lb 12.8 oz)    PHYSICAL EXAM: General:  Elderly. Obese. Well appearing. No resp difficulty HEENT: normal Neck: supple. no JVD. Carotids 2+ bilat; no bruits. No lymphadenopathy or thryomegaly appreciated. Cor: PMI nondisplaced. Loletha Grayer regular . 2/6 TR Lungs: clear Abdomen: obese soft, nontender, nondistended. No hepatosplenomegaly. No bruits or masses. Good bowel sounds. Extremities: no cyanosis, clubbing, rash, edema Neuro: alert & orientedx3, cranial nerves grossly intact. moves all 4 extremities w/o difficulty. Affect pleasant    ECG: SB 52 RBBB + LVH Personally reviewed   ASSESSMENT & PLAN:  1. Pulm HTN   - Likely mild to moderate pulmonary HTN due to [facility] Group II (diastolic HF) and III (OSA/OHS/hypoxia) PH. FEV1 0.93 (67%).No role for selective pulmonary vasodilators at this point. - Echo from 12/18 with no evidence of RV strain. RVSP estimated at 28mmHG which is stable.  - Echo 11/19 EF 60-65% RVSP 64 RV ok. Personally reviewed - Functional  status  improved with pulmonary rehab. Now off O2. - PFTs reviewed personally and shows mixed obstructive/restrictive picture with FEV1 0.93 (67%). Hi-res CT - No ILD - Continue CPAP - Continue lasix 40 mg  daily - Continue spiro 25 mg daily.  - Overall stable NYHA II-III. Pulmonary pressures remain stable on echo. Suspect mainly WHO Group 3. We discussed possibility of RHC but doubt it would change management much and she would prefer to avoid. Will continue current management.   2. Chronic hypoxic respiratory failure - multifactorial. PFTs reviewed personally. Mixed obstructive/restrictive picture. FEV1 only 0.93 - improved with pulmonary rehab. Now off O2 - Has seen  Dr. [place] Bells - Hi-res chest CT - no ILD  3. OSA  - continue CPAP - weight loss would help  4. Breast cancer - s/p resection   Glori Bickers, MD  2:57 PM

## 2019-09-14 ENCOUNTER — Other Ambulatory Visit (HOSPITAL_COMMUNITY): Payer: Self-pay | Admitting: Internal Medicine

## 2020-02-02 ENCOUNTER — Other Ambulatory Visit (HOSPITAL_COMMUNITY): Payer: Self-pay | Admitting: Internal Medicine

## 2020-04-08 ENCOUNTER — Other Ambulatory Visit: Payer: Self-pay | Admitting: Oncology

## 2020-04-08 DIAGNOSIS — Z9889 Other specified postprocedural states: Secondary | ICD-10-CM

## 2020-04-10 NOTE — Progress Notes (Signed)
[place]  [phone] [phone]     ID: AVAMARIE CROSSLEY DOB: Apr 07, 1936  MR#: [id number]  LEX#:[id number]  Patient Care Team: Sueanne Margarita, MD as PCP - General (Cardiology) Lawerance Cruel, MD as Consulting Physician (Family Medicine) Coralie Keens, MD as Consulting Physician (General Surgery) Shaguana Love, Virgie Dad, MD as Consulting Physician (Oncology) Bensimhon, Shaune Pascal, MD as Consulting Physician (Cardiology) Eppie Gibson, MD as Attending Physician (Radiation Oncology) Allyn Kenner, MD (Dermatology) OTHER MD:  CHIEF COMPLAINT: Paget's disease of the right nipple, estrogen receptor negative, HER-2 amplified  CURRENT TREATMENT: observation    INTERVAL HISTORY Ms. Depaulo returns for follow up of Paget's disease of the right nipple. She is accompanied by her daughter [name].  Since her last visit, she has not undergone any additional studies. She is scheduled for annual diagnostic mammogram on 05/03/2020.   REVIEW OF SYSTEMS: Katy Apo volunteers at her church and is making pillows for cancer patients she says.  She drives, including playing Melburn Popper for a friend of hers who does not drive and needs to go to the doctor.  She shops, cooks vegetables and makes cakes.  She tells me she and her daughter are trying to eat more vegetables and less meat.  There have been no falls, no unsteadiness, no visual changes, nausea, vomiting, cough, or change in bowel or bladder habits.  A detailed review of systems was otherwise stable   COVID 19 VACCINATION STATUS: Received a Covid x2, most recently March 2021 with the booster October 2021   HISTORY OF PRESENT ILLNESS: From the original intake note:  IDALIZ TINKLE is a 84 y.o. female with a new diagnosis of Paget's disease of the right nipple.   The patient reports discoloration of the right nipple present for approximately 2 years.  She eventually developed some nipple bleeding and scabbing.  She had  bilateral diagnostic mammography with tomography and right breast ultrasonography at the [facility] 01/04/2017 showing the breast density to be category B.  Mammography and ultrasonography were unremarkable, but on exam the right nipple was discolored, pink and somewhat scabbed.  There was no bleeding or discharge.  As the nipple problem persisted the patient was referred to dermatology and on 03/07/2018 Dr. [name] did a shave biopsy of the right nipple showing ([id number]) Paget's disease of the nipple, estrogen and progesterone receptor negative, but HER-2 amplified by immunohistochemistry (3+).  The patient was then referred to Dr. Ninfa Linden who set her up for repeat mammogram and Breast US done on 04/07/2018.  This showed recently diagnosed Paget's disease of the right nipple. No evidence of malignancy beyond the right nipple and areola. No evidence of metastatic adenopathy. Normal appearance of the left breast.   The patient's subsequent history is as detailed below   PAST MEDICAL HISTORY: Past Medical History:  Diagnosis Date  . Anemia    takes iron  . Arthritis    Dr Ronnie Derby  . Carotid artery stenosis    1-39% bilateral dopplers 08/2015  . Cataracts, bilateral   . Chronic kidney disease    stage III Dr Mercy Moore  . Diabetes mellitus   . Family history of breast cancer   . Family history of breast cancer   . Family history of melanoma   . GERD (gastroesophageal reflux disease)   . Heart murmur    Dr Irish Lack  . HTN (hypertension)   . Hyperlipidemia   . Morbid obesity with BMI of 40.0-44.9, adult ([place])   . Obstructive  sleep apnea (adult) (pediatric)    w CPAP Severe OSA AHI 52/hr CPAP at 10cm H2O  . Pulmonary HTN ([place])    mild with PASP 22mmHg echo 02/2014  . Right carotid bruit 08/19/2014  . Shortness of breath    resolved with CPAP  . Urination frequency   . Vitamin D deficiency disease   . Wears dentures    upper  . Wears glasses     PAST SURGICAL HISTORY: Past  Surgical History:  Procedure Laterality Date  . ABDOMINAL HYSTERECTOMY     partial  . APPENDECTOMY    . CARDIOVASCULAR STRESS TEST  2011  . CHOLECYSTECTOMY N/A 10/04/2015   Procedure: LAPAROSCOPIC CHOLECYSTECTOMY;  Surgeon: Coralie Keens, MD;  Location: [facility];  Service: General;  Laterality: N/A;  . COLONOSCOPY  01/15/2012   Procedure: COLONOSCOPY;  Surgeon: Lear Ng, MD;  Location: [facility];  Service: Endoscopy;  Laterality: N/A;  . ESOPHAGOGASTRODUODENOSCOPY  01/15/2012   Procedure: ESOPHAGOGASTRODUODENOSCOPY (EGD);  Surgeon: Lear Ng, MD;  Location: [facility];  Service: Endoscopy;  Laterality: N/A;  . JOINT REPLACEMENT Bilateral    Knees  . left shoulder surgery      rotator cuff tear   . MASTECTOMY, PARTIAL Right 06/11/2018   Procedure: RIGHT BREAST PARTIAL MASTECTOMY;  Surgeon: Coralie Keens, MD;  Location: [place] [facility];  Service: General;  Laterality: Right;  . sleep study  2012  . TOTAL KNEE ARTHROPLASTY     Left  . TOTAL KNEE ARTHROPLASTY  07/02/2011   Procedure: TOTAL KNEE ARTHROPLASTY;  Surgeon: Rudean Haskell, MD;  Location: [place] [facility];  Service: Orthopedics;  Laterality: Right;  . US ECHOCARDIOGRAPHY     2012    FAMILY HISTORY Family History  Problem Relation Age of Onset  . Diabetes Mother   . Diabetes Father   . Breast cancer Maternal Aunt        unkown age of diagnosis  . Melanoma Cousin   Her father was diabetic, and died after a fall in his early 46s. Her mother was also a diabetic, died in her 43s due to diabetic complications. The patient has one full brother and 84 half brothers. She has 2 full sisters, one passed away from tonic obstructive pulmonary disease at age 25.  The patient also had 2 half-sisters, one half sister died from a heart disease.  3 out of 6 maternal aunts had breast cancer. She denies a family history of ovarian cancer.   GYNECOLOGIC HISTORY:  Menarche: 47 YO First child: age 89 GXP2  Hysterectomy in her 59s. She  took hormone replacement for almost 30 years. BSO:no   SOCIAL HISTORY:  She is a retired Regulatory affairs officer.She is widowed. She lives with her daughter [name] and 2 granddaughters, aged 5 (a [place] teaching in pregnancy centers), and 25 (completed college but not currently working). Her daughter [name] works at [employer] Her son [name] lives in [place] and has 2 children.  The patient has 2 great-grandchildren.  She is a member of a local [place]: At the 04/11/2020 visit the patient was given the appropriate documents to complete and notarized at her discretion.  She intends to name her daughter Velna Hatchet as her healthcare power of attorney with her son Lanny Hurst second  HEALTH MAINTENANCE: Social History   Tobacco Use  . Smoking status: Never Smoker  . Smokeless tobacco: Never Used  Vaping Use  . Vaping Use: Never used  Substance Use Topics  .  Alcohol use: No  . Drug use: No     Colonoscopy: at [facility]  PAP:  Bone density: Never  Lipid panel:   Allergies  Allergen Reactions  . Codeine Itching  . Ibuprofen Other (See Comments)    "gave her kidney trouble"   Current Outpatient Medications  Medication Sig Dispense Refill  . acetaminophen (TYLENOL) 500 MG tablet Take 500 mg by mouth every 8 (eight) hours as needed for mild pain, moderate pain, fever or headache.     Marland Kitchen amLODipine (NORVASC) 10 MG tablet Take 10 mg by mouth daily.  6  . aspirin EC 81 MG tablet Take 81 mg by mouth daily.    Marland Kitchen atorvastatin (LIPITOR) 40 MG tablet Take 40 mg by mouth at bedtime.    . Cholecalciferol (VITAMIN D) 2000 UNITS CAPS Take 2,000 Units by mouth daily.    Marland Kitchen docusate sodium (COLACE) 100 MG capsule Take 100 mg by mouth daily as needed for mild constipation.    . Esomeprazole Magnesium 20 MG TBEC Take 20 mg by mouth daily.     Marland Kitchen ezetimibe (ZETIA) 10 MG tablet Take 10 mg by mouth at bedtime.    . furosemide (LASIX) 40 MG tablet Take 40 mg by mouth daily.       . Multiple Vitamins-Minerals (MULTIVITAMINS THER. W/MINERALS) TABS Take 1 tablet by mouth daily.    . pioglitazone (ACTOS) 30 MG tablet Take 30 mg by mouth daily.    Marland Kitchen spironolactone (ALDACTONE) 25 MG tablet TAKE 1 TABLET BY MOUTH  DAILY 90 tablet 3   No current facility-administered medications for this visit.    OBJECTIVE: African-American woman in no acute distress     Body mass index is 38.34 kg/m.    ECOG FS:1 - Symptomatic but completely ambulatory Vitals:   04/11/20 1036  BP: (!) 128/49  Pulse: 60  Resp: 18  Temp: 97.7 F (36.5 C)  TempSrc: Tympanic  SpO2: 90%  Weight: 202 lb 14.4 oz (92 kg)  Height: _0  (1.549 m)    Sclerae unicteric, EOMs intact Wearing a mask No cervical or supraclavicular adenopathy Lungs no rales or rhonchi Heart regular rate and rhythm Abd soft, obese, nontender, positive bowel sounds MSK no focal spinal tenderness, no upper extremity lymphedema Neuro: nonfocal, well oriented, appropriate affect Breasts: The right breast is status post central lumpectomy.  There is no evidence of disease recurrence.  The left breast is benign.  Both axillae are benign.   LAB RESULTS:  CMP     Component Value Date/Time   NA 144 07/14/2018 1500   K 4.4 07/14/2018 1500   CL 110 07/14/2018 1500   CO2 25 07/14/2018 1500   GLUCOSE 149 (H) 07/14/2018 1500   BUN 15 07/14/2018 1500   CREATININE 1.22 (H) 07/14/2018 1500   CREATININE 1.33 (H) 04/08/2018 1549   CALCIUM 9.7 07/14/2018 1500   PROT 7.5 07/14/2018 1500   ALBUMIN 3.7 07/14/2018 1500   AST 16 07/14/2018 1500   AST 18 04/08/2018 1549   ALT 8 07/14/2018 1500   ALT 12 04/08/2018 1549   ALKPHOS 82 07/14/2018 1500   BILITOT 0.5 07/14/2018 1500   BILITOT 0.5 04/08/2018 1549   GFRNONAA 42 (L) 07/14/2018 1500   GFRNONAA 37 (L) 04/08/2018 1549   GFRAA 48 (L) 07/14/2018 1500   GFRAA 43 (L) 04/08/2018 1549    No results found for: KPAFRELGTCHN, LAMBDASER, KAPLAMBRATIO  No results found for:  TOTALPROTELP, ALBUMINELP, A1GS, A2GS, BETS, [place], [place], MSPIKE, SPEI  Lab Results  Component Value Date   WBC 4.4 07/14/2018   NEUTROABS 2.6 07/14/2018   HGB 10.4 (L) 07/14/2018   HCT 33.1 (L) 07/14/2018   MCV 89.5 07/14/2018   PLT 246 07/14/2018   No results found for: LABCA2  No components found for: [id number]  No results for input(s): INR in the last 168 hours.  Urinalysis    Component Value Date/Time   COLORURINE YELLOW 09/14/2015 1344   APPEARANCEUR CLEAR 09/14/2015 1344   LABSPEC 1.009 09/14/2015 1344   PHURINE 6.0 09/14/2015 1344   GLUCOSEU NEGATIVE 09/14/2015 1344   HGBUR TRACE (A) 09/14/2015 1344   BILIRUBINUR NEGATIVE 09/14/2015 1344   KETONESUR NEGATIVE 09/14/2015 1344   PROTEINUR NEGATIVE 09/14/2015 1344   UROBILINOGEN 0.2 06/21/2011 1204   NITRITE NEGATIVE 09/14/2015 1344   LEUKOCYTESUR NEGATIVE 09/14/2015 1344    RADIOLOGY AND OTHER STUDIES: No results found.   ASSESSMENT: 84 y.o. [place] woman status post right nipple biopsy 03/07/2018, showing Paget's disease of the breast, estrogen and progesterone receptor negative, HER-2 amplified  (1) genetics testing 05/05/2018 through the [facility] Breast [facility] STAT + Common Hereditary Cancers Panel showed no deleterious mutations in ATM, BRCA1, BRCA2, CDH1, CHEK2, PALB2, PTEN, STK11 and TP53.  The Common Hereditary Cancers Panel offered by [facility] includes sequencing and/or deletion duplication testing of the following 47 genes: APC, ATM, AXIN2, BARD1, BMPR1A, BRCA1, BRCA2, BRIP1, CDH1, CDKN2A (p14ARF), CDKN2A (p16INK4a), CKD4, CHEK2, CTNNA1, DICER1, EPCAM (Deletion/duplication testing only), GREM1 (promoter region deletion/duplication testing only), KIT, MEN1, MLH1, MSH2, MSH3, MSH6, MUTYH, NBN, NF1, NHTL1, PALB2, PDGFRA, PMS2, POLD1, POLE, PTEN, RAD50, RAD51C, RAD51D, SDHB, SDHC, SDHD, SMAD4, SMARCA4. STK11, TP53, TSC1, TSC2, and VHL.  The following genes were evaluated for sequence changes only: SDHA and HOXB13  c.251G>A variant only.  (2) status post partial central mastectomy 06/11/2018 showing ductal carcinoma in situ, grade 2 measuring 1.5 cm, with negative margins.  (3) no radiation or anti-HER-2 treatment planned   PLAN: Ralph Leyden is coming up on 2 years out from definitive surgery for her breast cancer with no evidence of disease recurrence.  This is very favorable.  Because she only had her COVID-19 booster late October we are having to move her mammography back a couple of weeks.  This is being rescheduled  We discussed advanced directives today and I gave her the appropriate forms to complete.  She will return to see me a little over a year from now (I would prefer to see her after not before her mammogram next year).  She knows to call for any other issue that may develop before that visit  Total encounter time 25 minutes.Chauncey Cruel, MD   04/11/2020 11:00 AM Medical Oncology and Hematology [facility] [place], [place] [address] Tel. [phone]    Fax. [phone]   I, Wilburn Mylar, am acting as scribe for Dr. Virgie Dad. Skii Cleland.  I, Lurline Del MD, have reviewed the above documentation for accuracy and completeness, and I agree with the above.   *Total Encounter Time as defined by the Centers for Medicare and Medicaid Services includes, in addition to the face-to-face time of a patient visit (documented in the note above) non-face-to-face time: obtaining and reviewing outside history, ordering and reviewing medications, tests or procedures, care coordination (communications with other health care professionals or caregivers) and documentation in the medical record.

## 2020-04-11 ENCOUNTER — Other Ambulatory Visit: Payer: Self-pay

## 2020-04-11 ENCOUNTER — Inpatient Hospital Stay: Payer: Medicare Other | Attending: Oncology | Admitting: Oncology

## 2020-04-11 VITALS — BP 128/49 | HR 60 | Temp 97.7°F | Resp 18 | Ht 61.0 in | Wt 202.9 lb

## 2020-04-11 DIAGNOSIS — Z171 Estrogen receptor negative status [ER-]: Secondary | ICD-10-CM

## 2020-04-11 DIAGNOSIS — Z9011 Acquired absence of right breast and nipple: Secondary | ICD-10-CM | POA: Insufficient documentation

## 2020-04-11 DIAGNOSIS — Z96653 Presence of artificial knee joint, bilateral: Secondary | ICD-10-CM | POA: Diagnosis not present

## 2020-04-11 DIAGNOSIS — Z808 Family history of malignant neoplasm of other organs or systems: Secondary | ICD-10-CM | POA: Diagnosis not present

## 2020-04-11 DIAGNOSIS — Z853 Personal history of malignant neoplasm of breast: Secondary | ICD-10-CM | POA: Diagnosis not present

## 2020-04-11 DIAGNOSIS — Z803 Family history of malignant neoplasm of breast: Secondary | ICD-10-CM | POA: Diagnosis not present

## 2020-04-11 DIAGNOSIS — Z90710 Acquired absence of both cervix and uterus: Secondary | ICD-10-CM | POA: Diagnosis not present

## 2020-04-11 DIAGNOSIS — C50011 Malignant neoplasm of nipple and areola, right female breast: Secondary | ICD-10-CM | POA: Diagnosis not present

## 2020-05-12 ENCOUNTER — Ambulatory Visit
Admission: RE | Admit: 2020-05-12 | Discharge: 2020-05-12 | Disposition: A | Discharge status: Home / Self Care | Payer: Medicare Other | Source: Ambulatory Visit | Attending: Oncology | Admitting: Oncology

## 2020-05-12 ENCOUNTER — Other Ambulatory Visit: Payer: Self-pay

## 2020-05-12 DIAGNOSIS — Z9889 Other specified postprocedural states: Secondary | ICD-10-CM

## 2020-06-28 ENCOUNTER — Telehealth: Payer: Self-pay | Admitting: Cardiology

## 2020-06-28 NOTE — Telephone Encounter (Signed)
[name] is calling stating she has been having sneezing with a runny nose for the past couple of days. She states she has been taking Claritin for it, but it has not helped. [name] is requesting a callback from the nurse with Dr. Theodosia Blender recommendations since she has to wear a CPAP machine. Please advise.

## 2020-06-28 NOTE — Telephone Encounter (Signed)
Spoke with the patient who states that she has had a runny nose and a lot of sneezing recently. She states that she is going to try to take some ClaritinD to see if that helps. If not she is going to contact her PCP.

## 2020-07-15 DIAGNOSIS — G4733 Obstructive sleep apnea (adult) (pediatric): Secondary | ICD-10-CM | POA: Diagnosis not present

## 2020-07-20 DIAGNOSIS — M9904 Segmental and somatic dysfunction of sacral region: Secondary | ICD-10-CM | POA: Diagnosis not present

## 2020-07-20 DIAGNOSIS — M5136 Other intervertebral disc degeneration, lumbar region: Secondary | ICD-10-CM | POA: Diagnosis not present

## 2020-07-20 DIAGNOSIS — M9903 Segmental and somatic dysfunction of lumbar region: Secondary | ICD-10-CM | POA: Diagnosis not present

## 2020-07-20 DIAGNOSIS — M9905 Segmental and somatic dysfunction of pelvic region: Secondary | ICD-10-CM | POA: Diagnosis not present

## 2020-07-26 ENCOUNTER — Encounter: Payer: Self-pay | Admitting: Cardiology

## 2020-07-26 ENCOUNTER — Other Ambulatory Visit: Payer: Self-pay

## 2020-07-26 ENCOUNTER — Telehealth (INDEPENDENT_AMBULATORY_CARE_PROVIDER_SITE_OTHER): Payer: Medicare Other | Admitting: Cardiology

## 2020-07-26 VITALS — Ht 61.0 in | Wt 205.0 lb

## 2020-07-26 DIAGNOSIS — Z6841 Body Mass Index (BMI) 40.0 and over, adult: Secondary | ICD-10-CM

## 2020-07-26 DIAGNOSIS — M5136 Other intervertebral disc degeneration, lumbar region: Secondary | ICD-10-CM | POA: Diagnosis not present

## 2020-07-26 DIAGNOSIS — G4733 Obstructive sleep apnea (adult) (pediatric): Secondary | ICD-10-CM

## 2020-07-26 DIAGNOSIS — M9905 Segmental and somatic dysfunction of pelvic region: Secondary | ICD-10-CM | POA: Diagnosis not present

## 2020-07-26 DIAGNOSIS — M9904 Segmental and somatic dysfunction of sacral region: Secondary | ICD-10-CM | POA: Diagnosis not present

## 2020-07-26 DIAGNOSIS — M9903 Segmental and somatic dysfunction of lumbar region: Secondary | ICD-10-CM | POA: Diagnosis not present

## 2020-07-26 DIAGNOSIS — I10 Essential (primary) hypertension: Secondary | ICD-10-CM

## 2020-07-26 NOTE — Patient Instructions (Signed)
Medication Instructions:  Your physician recommends that you continue on your current medications as directed. Please refer to the Current Medication list given to you today. *If you need a refill on your cardiac medications before your next appointment, please call your pharmacy*   Lab Work: none If you have labs (blood work) drawn today and your tests are completely normal, you will receive your results only by: Marland Kitchen MyChart Message (if you have MyChart) OR . A paper copy in the mail If you have any lab test that is abnormal or we need to change your treatment, we will call you to review the results.   Testing/Procedures: none   Follow-Up: At [facility], you and your health needs are our priority.  As part of our continuing mission to provide you with exceptional heart care, we have created designated Provider Care Teams.  These Care Teams include your primary Cardiologist (physician) and Advanced Practice Providers (APPs -  Physician Assistants and Nurse Practitioners) who all work together to provide you with the care you need, when you need it.  We recommend signing up for the patient portal called "MyChart".  Sign up information is provided on this After Visit Summary.  MyChart is used to connect with patients for Virtual Visits (Telemedicine).  Patients are able to view lab/test results, encounter notes, upcoming appointments, etc.  Non-urgent messages can be sent to your provider as well.   To learn more about what you can do with MyChart, go to [email or web address].    Your next appointment:   1 year(s)  The format for your next appointment:   In Person  Provider:    Dr. Fransico Him

## 2020-07-26 NOTE — Progress Notes (Signed)
Virtual Visit via Telephone Note   This visit type was conducted due to national recommendations for restrictions regarding the COVID-19 Pandemic (e.g. social distancing) in an effort to limit this patient's exposure and mitigate transmission in our community.  Due to her co-morbid illnesses, this patient is at least at moderate risk for complications without adequate follow up.  This format is felt to be most appropriate for this patient at this time.  The patient did not have access to video technology/had technical difficulties with video requiring transitioning to audio format only (telephone).  All issues noted in this document were discussed and addressed.  No physical exam could be performed with this format.  Please refer to the patient's chart for her  consent to telehealth for [facility].   Evaluation Performed:  Follow-up visit  This visit type was conducted due to national recommendations for restrictions regarding the COVID-19 Pandemic (e.g. social distancing).  This format is felt to be most appropriate for this patient at this time.  All issues noted in this document were discussed and addressed.  No physical exam was performed (except for noted visual exam findings with Video Visits).  Please refer to the patient's chart (MyChart message for video visits and phone note for telephone visits) for the patient's consent to telehealth for [facility].  Date:  07/26/2020   ID:  Tiffany, Olsen Jan 21, 1936, MRN [id number]  Patient Location:  Home  Provider location:   [place]  PCP:  Sueanne Margarita, MD  Sleep medicine:  Fransico Him, MD Electrophysiologist:  None   Chief Complaint:  OSA, HTN  History of Present Illness:    [name] is a 85 y.o. female who presents via audio/video conferencing for a telehealth visit today.    [name] is a 85y.o. female with a hx of OSA, obesity, moderate pulmonary HTN, diastolic dysfunction and HTN. She is doing  well with her CPAP device and thinks that she has gotten used to it.  She tolerates the nasal mask and feels the pressure is adequate.  Since going on CPAP she feels rested in the am and has no significant daytime sleepiness.  She has some mild mouth dryness but no nasal congestion.  She does not think that he snores.    The patient does not have symptoms concerning for COVID-19 infection (fever, chills, cough, or new shortness of breath).   Prior CV studies:   The following studies were reviewed today:  PAP compliance download from [place] and D[facility]  Past Medical History:  Diagnosis Date  . Anemia    takes iron  . Arthritis    Dr Ronnie Derby  . Carotid artery stenosis    1-39% bilateral dopplers 08/2015  . Cataracts, bilateral   . Chronic kidney disease    stage III Dr Mercy Moore  . Diabetes mellitus   . Family history of breast cancer   . Family history of breast cancer   . Family history of melanoma   . GERD (gastroesophageal reflux disease)   . Heart murmur    Dr Irish Lack  . HTN (hypertension)   . Hyperlipidemia   . Morbid obesity with BMI of 40.0-44.9, adult ([place])   . Obstructive sleep apnea (adult) (pediatric)    w CPAP Severe OSA AHI 52/hr CPAP at 10cm H2O  . Pulmonary HTN ([place])    mild with PASP 78mmHg echo 02/2014  . Right carotid bruit 08/19/2014  . Shortness of breath    resolved with  CPAP  . Urination frequency   . Vitamin D deficiency disease   . Wears dentures    upper  . Wears glasses    Past Surgical History:  Procedure Laterality Date  . ABDOMINAL HYSTERECTOMY     partial  . APPENDECTOMY    . CARDIOVASCULAR STRESS TEST  2011  . CHOLECYSTECTOMY N/A 10/04/2015   Procedure: LAPAROSCOPIC CHOLECYSTECTOMY;  Surgeon: Coralie Keens, MD;  Location: [facility];  Service: General;  Laterality: N/A;  . COLONOSCOPY  01/15/2012   Procedure: COLONOSCOPY;  Surgeon: Lear Ng, MD;  [facility]: [facility];  Service: Endoscopy;  Laterality: N/A;  .  ESOPHAGOGASTRODUODENOSCOPY  01/15/2012   Procedure: ESOPHAGOGASTRODUODENOSCOPY (EGD);  Surgeon: Lear Ng, MD;  Location: [facility];  Service: Endoscopy;  Laterality: N/A;  . JOINT REPLACEMENT Bilateral    Knees  . left shoulder surgery      rotator cuff tear   . MASTECTOMY, PARTIAL Right 06/11/2018   Procedure: RIGHT BREAST PARTIAL MASTECTOMY;  Surgeon: Coralie Keens, MD;  Location: [place];  Service: General;  Laterality: Right;  . sleep study  2012  . TOTAL KNEE ARTHROPLASTY     Left  . TOTAL KNEE ARTHROPLASTY  07/02/2011   Procedure: TOTAL KNEE ARTHROPLASTY;  Surgeon: Rudean Haskell, MD;  Location: [place];  Service: Orthopedics;  Laterality: Right;  . US ECHOCARDIOGRAPHY     2012     Current Meds  Medication Sig  . acetaminophen (TYLENOL) 500 MG tablet Take 500 mg by mouth every 8 (eight) hours as needed for mild pain, moderate pain, fever or headache.   Marland Kitchen amLODipine (NORVASC) 10 MG tablet Take 10 mg by mouth daily.  Marland Kitchen aspirin EC 81 MG tablet Take 81 mg by mouth daily.  Marland Kitchen atorvastatin (LIPITOR) 40 MG tablet Take 40 mg by mouth at bedtime.  . Cholecalciferol (VITAMIN D) 2000 UNITS CAPS Take 2,000 Units by mouth daily.  Marland Kitchen docusate sodium (COLACE) 100 MG capsule Take 100 mg by mouth daily as needed for mild constipation.  . Esomeprazole Magnesium 20 MG TBEC Take 20 mg by mouth daily.   Marland Kitchen ezetimibe (ZETIA) 10 MG tablet Take 10 mg by mouth at bedtime.  . furosemide (LASIX) 40 MG tablet Take 40 mg by mouth daily.   . Multiple Vitamins-Minerals (MULTIVITAMINS THER. W/MINERALS) TABS Take 1 tablet by mouth daily.  . pioglitazone (ACTOS) 30 MG tablet Take 30 mg by mouth daily.  Marland Kitchen spironolactone (ALDACTONE) 25 MG tablet Take 25 mg by mouth every other day.     Allergies:   Codeine and Ibuprofen   Social History   Tobacco Use  . Smoking status: Never Smoker  . Smokeless tobacco: Never Used  Vaping Use  . Vaping Use: Never used  Substance Use Topics  . Alcohol use: No  .  Drug use: No     Family Hx: The patient's family history includes Breast cancer in her maternal aunt; Diabetes in her father and mother; Melanoma in her cousin.  ROS:   Please see the history of present illness.     All other systems reviewed and are negative.   Labs/Other Tests and Data Reviewed:    Recent Labs: No results found for requested labs within last 8760 hours.   Recent Lipid Panel No results found for: CHOL, TRIG, HDL, CHOLHDL, LDLCALC, LDLDIRECT  Wt Readings from Last 3 Encounters:  07/26/20 205 lb (93 kg)  04/11/20 202 lb 14.4 oz (92 kg)  08/17/19 209 lb 12.8 oz (95.2 kg)  Objective:    Vital Signs:  Ht 5\' 1"  (1.549 m)   Wt 205 lb (93 kg)   BMI 38.73 kg/m     ASSESSMENT & PLAN:    1.  OSA -.  The patient is tolerating PAP therapy well without any problems. The PAP download was reviewed today and showed an AHI of 0.3/hr on 10 cm H2O with 53% compliance in using more than 4 hours nightly.  The patient has been using and benefiting from PAP use and will continue to benefit from therapy.   2.  HTN -BP controlled -continue amlodipine 10mg  daily and Spiro 25mg  daily  3. Morbid Obesity -I have encouraged her to get into a routine exercise program and cut back on carbs and portions.  -encourage her to eat more fruits and veggies   COVID-19 Education: The signs and symptoms of COVID-19 were discussed with the patient and how to seek care for testing (follow up with PCP or arrange E-visit).  The importance of social distancing was discussed today.  Patient Risk:   After full review of this patient's clinical status, I feel that they are at least moderate risk at this time.  Time:   Today, I have spent 20 minutes on telemedicine discussing medical problems including OSA,HTN, Obesity and reviewing patient's chart including PAP compliance download from DME on [facility].  Medication Adjustments/Labs and Tests Ordered: Current medicines are reviewed at length  with the patient today.  Concerns regarding medicines are outlined above.  Tests Ordered: No orders of the defined types were placed in this encounter.  Medication Changes: No orders of the defined types were placed in this encounter.   Disposition:  Follow up in 1 year(s)  Signed, Fransico Him, MD  07/26/2020 8:10 AM    [facility]

## 2020-07-27 DIAGNOSIS — M5136 Other intervertebral disc degeneration, lumbar region: Secondary | ICD-10-CM | POA: Diagnosis not present

## 2020-07-27 DIAGNOSIS — M9903 Segmental and somatic dysfunction of lumbar region: Secondary | ICD-10-CM | POA: Diagnosis not present

## 2020-07-27 DIAGNOSIS — M9905 Segmental and somatic dysfunction of pelvic region: Secondary | ICD-10-CM | POA: Diagnosis not present

## 2020-07-27 DIAGNOSIS — M9904 Segmental and somatic dysfunction of sacral region: Secondary | ICD-10-CM | POA: Diagnosis not present

## 2020-08-01 DIAGNOSIS — M9904 Segmental and somatic dysfunction of sacral region: Secondary | ICD-10-CM | POA: Diagnosis not present

## 2020-08-01 DIAGNOSIS — M9903 Segmental and somatic dysfunction of lumbar region: Secondary | ICD-10-CM | POA: Diagnosis not present

## 2020-08-01 DIAGNOSIS — M5136 Other intervertebral disc degeneration, lumbar region: Secondary | ICD-10-CM | POA: Diagnosis not present

## 2020-08-01 DIAGNOSIS — M9905 Segmental and somatic dysfunction of pelvic region: Secondary | ICD-10-CM | POA: Diagnosis not present

## 2020-08-03 DIAGNOSIS — M9905 Segmental and somatic dysfunction of pelvic region: Secondary | ICD-10-CM | POA: Diagnosis not present

## 2020-08-03 DIAGNOSIS — M9904 Segmental and somatic dysfunction of sacral region: Secondary | ICD-10-CM | POA: Diagnosis not present

## 2020-08-03 DIAGNOSIS — M9903 Segmental and somatic dysfunction of lumbar region: Secondary | ICD-10-CM | POA: Diagnosis not present

## 2020-08-03 DIAGNOSIS — M5136 Other intervertebral disc degeneration, lumbar region: Secondary | ICD-10-CM | POA: Diagnosis not present

## 2020-08-09 DIAGNOSIS — E1122 Type 2 diabetes mellitus with diabetic chronic kidney disease: Secondary | ICD-10-CM | POA: Diagnosis not present

## 2020-08-09 DIAGNOSIS — I10 Essential (primary) hypertension: Secondary | ICD-10-CM | POA: Diagnosis not present

## 2020-08-09 DIAGNOSIS — D638 Anemia in other chronic diseases classified elsewhere: Secondary | ICD-10-CM | POA: Diagnosis not present

## 2020-08-09 DIAGNOSIS — E7800 Pure hypercholesterolemia, unspecified: Secondary | ICD-10-CM | POA: Diagnosis not present

## 2020-08-09 DIAGNOSIS — D509 Iron deficiency anemia, unspecified: Secondary | ICD-10-CM | POA: Diagnosis not present

## 2020-08-09 DIAGNOSIS — H35033 Hypertensive retinopathy, bilateral: Secondary | ICD-10-CM | POA: Diagnosis not present

## 2020-08-09 DIAGNOSIS — N183 Chronic kidney disease, stage 3 unspecified: Secondary | ICD-10-CM | POA: Diagnosis not present

## 2020-08-15 DIAGNOSIS — C50011 Malignant neoplasm of nipple and areola, right female breast: Secondary | ICD-10-CM | POA: Diagnosis not present

## 2020-08-16 DIAGNOSIS — R450 Nervousness: Secondary | ICD-10-CM | POA: Diagnosis not present

## 2020-08-16 DIAGNOSIS — D638 Anemia in other chronic diseases classified elsewhere: Secondary | ICD-10-CM | POA: Diagnosis not present

## 2020-08-16 DIAGNOSIS — N183 Chronic kidney disease, stage 3 unspecified: Secondary | ICD-10-CM | POA: Diagnosis not present

## 2020-08-16 DIAGNOSIS — I10 Essential (primary) hypertension: Secondary | ICD-10-CM | POA: Diagnosis not present

## 2020-08-16 DIAGNOSIS — E1122 Type 2 diabetes mellitus with diabetic chronic kidney disease: Secondary | ICD-10-CM | POA: Diagnosis not present

## 2020-09-19 DIAGNOSIS — M9903 Segmental and somatic dysfunction of lumbar region: Secondary | ICD-10-CM | POA: Diagnosis not present

## 2020-09-19 DIAGNOSIS — M9905 Segmental and somatic dysfunction of pelvic region: Secondary | ICD-10-CM | POA: Diagnosis not present

## 2020-09-19 DIAGNOSIS — M9904 Segmental and somatic dysfunction of sacral region: Secondary | ICD-10-CM | POA: Diagnosis not present

## 2020-09-19 DIAGNOSIS — M5136 Other intervertebral disc degeneration, lumbar region: Secondary | ICD-10-CM | POA: Diagnosis not present

## 2020-09-21 DIAGNOSIS — M5136 Other intervertebral disc degeneration, lumbar region: Secondary | ICD-10-CM | POA: Diagnosis not present

## 2020-09-21 DIAGNOSIS — M9903 Segmental and somatic dysfunction of lumbar region: Secondary | ICD-10-CM | POA: Diagnosis not present

## 2020-09-21 DIAGNOSIS — M9905 Segmental and somatic dysfunction of pelvic region: Secondary | ICD-10-CM | POA: Diagnosis not present

## 2020-09-21 DIAGNOSIS — M9904 Segmental and somatic dysfunction of sacral region: Secondary | ICD-10-CM | POA: Diagnosis not present

## 2020-09-27 DIAGNOSIS — M9905 Segmental and somatic dysfunction of pelvic region: Secondary | ICD-10-CM | POA: Diagnosis not present

## 2020-09-27 DIAGNOSIS — M5136 Other intervertebral disc degeneration, lumbar region: Secondary | ICD-10-CM | POA: Diagnosis not present

## 2020-09-27 DIAGNOSIS — M9903 Segmental and somatic dysfunction of lumbar region: Secondary | ICD-10-CM | POA: Diagnosis not present

## 2020-09-27 DIAGNOSIS — M9904 Segmental and somatic dysfunction of sacral region: Secondary | ICD-10-CM | POA: Diagnosis not present

## 2020-09-29 DIAGNOSIS — M5136 Other intervertebral disc degeneration, lumbar region: Secondary | ICD-10-CM | POA: Diagnosis not present

## 2020-09-29 DIAGNOSIS — M9904 Segmental and somatic dysfunction of sacral region: Secondary | ICD-10-CM | POA: Diagnosis not present

## 2020-09-29 DIAGNOSIS — M9905 Segmental and somatic dysfunction of pelvic region: Secondary | ICD-10-CM | POA: Diagnosis not present

## 2020-09-29 DIAGNOSIS — M9903 Segmental and somatic dysfunction of lumbar region: Secondary | ICD-10-CM | POA: Diagnosis not present

## 2020-10-19 ENCOUNTER — Other Ambulatory Visit (HOSPITAL_COMMUNITY): Payer: Self-pay

## 2020-10-19 MED ORDER — SPIRONOLACTONE 25 MG PO TABS: 25.0000 mg | ORAL_TABLET | ORAL | 0 refills | Status: DC

## 2020-11-02 DIAGNOSIS — H35033 Hypertensive retinopathy, bilateral: Secondary | ICD-10-CM | POA: Diagnosis not present

## 2020-11-02 DIAGNOSIS — H2513 Age-related nuclear cataract, bilateral: Secondary | ICD-10-CM | POA: Diagnosis not present

## 2020-11-02 DIAGNOSIS — H43812 Vitreous degeneration, left eye: Secondary | ICD-10-CM | POA: Diagnosis not present

## 2020-11-02 DIAGNOSIS — E119 Type 2 diabetes mellitus without complications: Secondary | ICD-10-CM | POA: Diagnosis not present

## 2020-11-26 ENCOUNTER — Encounter (HOSPITAL_COMMUNITY): Payer: Self-pay | Admitting: Emergency Medicine

## 2020-11-26 ENCOUNTER — Other Ambulatory Visit: Payer: Self-pay

## 2020-11-26 ENCOUNTER — Emergency Department (HOSPITAL_COMMUNITY)
Admission: EM | Admit: 2020-11-26 | Discharge: 2020-11-26 | Disposition: A | Discharge status: Home / Self Care | Payer: Medicare Other | Attending: Emergency Medicine | Admitting: Emergency Medicine

## 2020-11-26 ENCOUNTER — Emergency Department (HOSPITAL_COMMUNITY): Payer: Medicare Other

## 2020-11-26 DIAGNOSIS — E1136 Type 2 diabetes mellitus with diabetic cataract: Secondary | ICD-10-CM | POA: Insufficient documentation

## 2020-11-26 DIAGNOSIS — F419 Anxiety disorder, unspecified: Secondary | ICD-10-CM | POA: Diagnosis present

## 2020-11-26 DIAGNOSIS — Z79899 Other long term (current) drug therapy: Secondary | ICD-10-CM | POA: Insufficient documentation

## 2020-11-26 DIAGNOSIS — E785 Hyperlipidemia, unspecified: Secondary | ICD-10-CM | POA: Insufficient documentation

## 2020-11-26 DIAGNOSIS — Z7982 Long term (current) use of aspirin: Secondary | ICD-10-CM | POA: Insufficient documentation

## 2020-11-26 DIAGNOSIS — Z853 Personal history of malignant neoplasm of breast: Secondary | ICD-10-CM | POA: Insufficient documentation

## 2020-11-26 DIAGNOSIS — E1169 Type 2 diabetes mellitus with other specified complication: Secondary | ICD-10-CM | POA: Insufficient documentation

## 2020-11-26 DIAGNOSIS — I10 Essential (primary) hypertension: Secondary | ICD-10-CM | POA: Diagnosis not present

## 2020-11-26 DIAGNOSIS — N183 Chronic kidney disease, stage 3 unspecified: Secondary | ICD-10-CM | POA: Diagnosis not present

## 2020-11-26 DIAGNOSIS — Z7984 Long term (current) use of oral hypoglycemic drugs: Secondary | ICD-10-CM | POA: Diagnosis not present

## 2020-11-26 DIAGNOSIS — E1122 Type 2 diabetes mellitus with diabetic chronic kidney disease: Secondary | ICD-10-CM | POA: Insufficient documentation

## 2020-11-26 DIAGNOSIS — Z96652 Presence of left artificial knee joint: Secondary | ICD-10-CM | POA: Diagnosis not present

## 2020-11-26 DIAGNOSIS — R55 Syncope and collapse: Secondary | ICD-10-CM | POA: Diagnosis not present

## 2020-11-26 DIAGNOSIS — I129 Hypertensive chronic kidney disease with stage 1 through stage 4 chronic kidney disease, or unspecified chronic kidney disease: Secondary | ICD-10-CM | POA: Diagnosis not present

## 2020-11-26 DIAGNOSIS — R9431 Abnormal electrocardiogram [ECG] [EKG]: Secondary | ICD-10-CM | POA: Diagnosis not present

## 2020-11-26 DIAGNOSIS — R0789 Other chest pain: Secondary | ICD-10-CM | POA: Diagnosis not present

## 2020-11-26 LAB — COMPREHENSIVE METABOLIC PANEL
ALT: 20 U/L (ref 0–44)
AST: 44 U/L — ABNORMAL HIGH (ref 15–41)
Albumin: 4.1 g/dL (ref 3.5–5.0)
Alkaline Phosphatase: 73 U/L (ref 38–126)
Anion gap: 10 (ref 5–15)
BUN: 38 mg/dL — ABNORMAL HIGH (ref 8–23)
CO2: 23 mmol/L (ref 22–32)
Calcium: 9.1 mg/dL (ref 8.9–10.3)
Chloride: 103 mmol/L (ref 98–111)
Creatinine, Ser: 1.9 mg/dL — ABNORMAL HIGH (ref 0.44–1.00)
GFR, Estimated: 26 mL/min — ABNORMAL LOW (ref >60)
Glucose, Bld: 123 mg/dL — ABNORMAL HIGH (ref 70–99)
Potassium: 5.6 mmol/L — ABNORMAL HIGH (ref 3.5–5.1)
Sodium: 136 mmol/L (ref 135–145)
Total Bilirubin: 0.8 mg/dL (ref 0.3–1.2)
Total Protein: 8 g/dL (ref 6.5–8.1)

## 2020-11-26 LAB — BASIC METABOLIC PANEL
Anion gap: 8 (ref 5–15)
BUN: 35 mg/dL — ABNORMAL HIGH (ref 8–23)
CO2: 24 mmol/L (ref 22–32)
Calcium: 8.5 mg/dL — ABNORMAL LOW (ref 8.9–10.3)
Chloride: 107 mmol/L (ref 98–111)
Creatinine, Ser: 1.55 mg/dL — ABNORMAL HIGH (ref 0.44–1.00)
GFR, Estimated: 33 mL/min — ABNORMAL LOW (ref >60)
Glucose, Bld: 121 mg/dL — ABNORMAL HIGH (ref 70–99)
Potassium: 4 mmol/L (ref 3.5–5.1)
Sodium: 139 mmol/L (ref 135–145)

## 2020-11-26 LAB — TROPONIN I (HIGH SENSITIVITY)
Troponin I (High Sensitivity): 8 ng/L (ref <18)
Troponin I (High Sensitivity): 14 ng/L (ref <18)

## 2020-11-26 LAB — CBC
HCT: 33.8 % — ABNORMAL LOW (ref 36.0–46.0)
Hemoglobin: 11 g/dL — ABNORMAL LOW (ref 12.0–15.0)
MCH: 28.9 pg (ref 26.0–34.0)
MCHC: 32.5 g/dL (ref 30.0–36.0)
MCV: 88.9 fL (ref 80.0–100.0)
Platelets: 242 10*3/uL (ref 150–400)
RBC: 3.8 MIL/uL — ABNORMAL LOW (ref 3.87–5.11)
RDW: 15.9 % — ABNORMAL HIGH (ref 11.5–15.5)
WBC: 5.9 10*3/uL (ref 4.0–10.5)
nRBC: 0 % (ref 0.0–0.2)

## 2020-11-26 MED ORDER — AMLODIPINE BESYLATE 5 MG PO TABS
10.0000 mg | ORAL_TABLET | Freq: Once | ORAL | Status: AC
  Administered 2020-11-26: 10 mg via ORAL
  Filled 2020-11-26: qty 2

## 2020-11-26 MED ORDER — SODIUM CHLORIDE 0.9 % IV BOLUS
1000.0000 mL | Freq: Once | INTRAVENOUS | Status: AC
  Administered 2020-11-26: 1000 mL via INTRAVENOUS

## 2020-11-26 MED ORDER — LORAZEPAM 2 MG/ML IJ SOLN
0.2500 mg | Freq: Once | INTRAMUSCULAR | Status: AC
  Administered 2020-11-26: 0.25 mg via INTRAVENOUS
  Filled 2020-11-26: qty 1

## 2020-11-26 MED ORDER — SODIUM CHLORIDE 0.9 % IV BOLUS
500.0000 mL | Freq: Once | INTRAVENOUS | Status: AC
  Administered 2020-11-26: 500 mL via INTRAVENOUS

## 2020-11-26 MED ORDER — LORAZEPAM 2 MG/ML IJ SOLN: 0.2500 mg | Freq: Once | INTRAMUSCULAR | Status: DC

## 2020-11-26 MED ORDER — FUROSEMIDE 40 MG PO TABS
40.0000 mg | ORAL_TABLET | Freq: Once | ORAL | Status: AC
  Administered 2020-11-26: 40 mg via ORAL
  Filled 2020-11-26: qty 1

## 2020-11-26 NOTE — Discharge Instructions (Addendum)
Please follow up with your primary care provider within 5-7 days for re-evaluation of your symptoms. If you do not have a primary care provider, information for a healthcare clinic has been provided for you to make arrangements for follow up care. Please return to the emergency department for any new or worsening symptoms. ° °

## 2020-11-26 NOTE — ED Provider Notes (Signed)
[place] DEPT Provider Note   CSN: [id number] Arrival date & time: 11/26/20  1701     History Chief Complaint  Patient presents with   Anxiety    [name] is a 86 y.o. female.  HPI  85 year old female history, carotid artery stenosis, CKD, GERD, heart murmur, hypertension, hyperlipidemia, pulmonary hypertension, who presents to the emergency department today for evaluation of anxiety.  The patient states that she was at a presentation at the [facility] that was talking about mental health when she started to have a nervous feeling inside her body and started crying.  She fell like she needed to be evaluated so she went to see her PCP who did an EKG and thought that it was abnormal so sent here for further evaluation.  Per EMS and on review of records, Pt seen at [facility] and was noted to have anxiety, presyncope, chest discomfort and an abnormal EKG so was sent here for further eval.  On my eval, patient denies any chest pain, shortness of breath, palpitations, leg swelling, cough, fevers or other associated symptoms recently.  Past Medical History:  Diagnosis Date   Anemia    takes iron   Arthritis    Dr Ronnie Derby   Carotid artery stenosis    1-39% bilateral dopplers 08/2015   Cataracts, bilateral    Chronic kidney disease    stage III Dr Mercy Moore   Diabetes mellitus    Family history of breast cancer    Family history of breast cancer    Family history of melanoma    GERD (gastroesophageal reflux disease)    Heart murmur    Dr Irish Lack   HTN (hypertension)    Hyperlipidemia    Morbid obesity with BMI of 40.0-44.9, adult ([place])    Obstructive sleep apnea (adult) (pediatric)    w CPAP Severe OSA AHI 52/hr CPAP at 10cm H2O   Pulmonary HTN ([facility])    mild with PASP 76mmHg echo 02/2014   Right carotid bruit 08/19/2014   Shortness of breath    resolved with CPAP   Urination frequency    Vitamin D deficiency disease    Wears dentures    upper    Wears glasses     Patient Active Problem List   Diagnosis Date Noted   Genetic testing 05/06/2018   Family history of melanoma    Family history of breast cancer    Malignant neoplasm involving both nipple and areola of right breast in female, estrogen receptor negative ([place]) 04/08/2018   Paget's carcinoma of the nipple, right ([place]) 04/08/2018   S/P laparoscopic cholecystectomy 10/04/2015   Carotid artery stenosis    Right carotid bruit 08/19/2014   HTN (hypertension)    Obstructive sleep apnea    Morbid obesity with BMI of 40.0-44.9, adult ([place])    Vitamin D deficiency disease    Pulmonary HTN ([place])    GERD (gastroesophageal reflux disease)    Arthritis    Cataracts, bilateral    Shortness of breath    Heart murmur    Chronic kidney disease    Iron deficiency anemia, unspecified 01/15/2012    Past Surgical History:  Procedure Laterality Date   ABDOMINAL HYSTERECTOMY     partial   APPENDECTOMY     CARDIOVASCULAR STRESS TEST  2011   CHOLECYSTECTOMY N/A 10/04/2015   Procedure: LAPAROSCOPIC CHOLECYSTECTOMY;  Surgeon: Coralie Keens, MD;  [facility];  Service: General;  Laterality: N/A;   COLONOSCOPY  01/15/2012  Procedure: COLONOSCOPY;  Surgeon: Lear Ng, MD;  Location: [facility];  Service: Endoscopy;  Laterality: N/A;   ESOPHAGOGASTRODUODENOSCOPY  01/15/2012   Procedure: ESOPHAGOGASTRODUODENOSCOPY (EGD);  Surgeon: Lear Ng, MD;  Location: [facility];  Service: Endoscopy;  Laterality: N/A;   JOINT REPLACEMENT Bilateral    Knees   left shoulder surgery      rotator cuff tear    MASTECTOMY, PARTIAL Right 06/11/2018   Procedure: RIGHT BREAST PARTIAL MASTECTOMY;  Surgeon: Coralie Keens, MD;  Location: [place];  Service: General;  Laterality: Right;   sleep study  2012   TOTAL KNEE ARTHROPLASTY     Left   TOTAL KNEE ARTHROPLASTY  07/02/2011   Procedure: TOTAL KNEE ARTHROPLASTY;  Surgeon: Rudean Haskell, MD;  Location: [place];  Service:  Orthopedics;  Laterality: Right;   US ECHOCARDIOGRAPHY     2012     OB History   No obstetric history on file.     Family History  Problem Relation Age of Onset   Diabetes Mother    Diabetes Father    Breast cancer Maternal Aunt        unkown age of diagnosis   Melanoma Cousin     Social History   Tobacco Use   Smoking status: Never   Smokeless tobacco: Never  Vaping Use   Vaping Use: Never used  Substance Use Topics   Alcohol use: No   Drug use: No    Home Medications Prior to Admission medications   Medication Sig Start Date End Date Taking? Authorizing Provider  acetaminophen (TYLENOL) 500 MG tablet Take 500 mg by mouth every 8 (eight) hours as needed for mild pain, moderate pain, fever or headache.     [provider]  amLODipine (NORVASC) 10 MG tablet Take 10 mg by mouth daily. 02/17/15   [provider]  aspirin EC 81 MG tablet Take 81 mg by mouth daily.    [provider]  atorvastatin (LIPITOR) 40 MG tablet Take 40 mg by mouth at bedtime.    [provider]  Cholecalciferol (VITAMIN D) 2000 UNITS CAPS Take 2,000 Units by mouth daily.    [provider]  docusate sodium (COLACE) 100 MG capsule Take 100 mg by mouth daily as needed for mild constipation.    [provider]  Esomeprazole Magnesium 20 MG TBEC Take 20 mg by mouth daily.     [provider]  ezetimibe (ZETIA) 10 MG tablet Take 10 mg by mouth at bedtime.    [provider]  furosemide (LASIX) 40 MG tablet Take 40 mg by mouth daily.  04/17/13   [provider]  Multiple Vitamins-Minerals (MULTIVITAMINS THER. W/MINERALS) TABS Take 1 tablet by mouth daily.    [provider]  pioglitazone (ACTOS) 30 MG tablet Take 30 mg by mouth daily.    [provider]  spironolactone (ALDACTONE) 25 MG tablet Take 1 tablet (25 mg total) by mouth every other day. Appointment Required For Further Refills [phone] 10/19/20    Bensimhon, Shaune Pascal, MD    Allergies    Codeine and Ibuprofen  Review of Systems   Review of Systems  Constitutional:  Negative for chills and fever.  HENT:  Negative for ear pain and sore throat.   Eyes:  Negative for pain and visual disturbance.  Respiratory:  Negative for cough and shortness of breath.   Cardiovascular:  Negative for chest pain, palpitations and leg swelling.  Gastrointestinal:  Negative for abdominal pain,  constipation, diarrhea, nausea and vomiting.  Genitourinary:  Negative for dysuria and hematuria.  Musculoskeletal:  Negative for back pain.  Skin:  Negative for rash.  Neurological:  Negative for seizures and syncope.  Psychiatric/Behavioral:  The patient is nervous/anxious.   All other systems reviewed and are negative.  Physical Exam Updated Vital Signs BP (!) 143/56   Pulse 60   Temp 97.6 F (36.4 C) (Oral)   Resp (!) 25   SpO2 100%   Physical Exam Vitals and nursing note reviewed.  Constitutional:      General: She is not in acute distress.    Appearance: She is well-developed.  HENT:     Head: Normocephalic and atraumatic.  Eyes:     Conjunctiva/sclera: Conjunctivae normal.  Cardiovascular:     Rate and Rhythm: Normal rate and regular rhythm.     Heart sounds: Normal heart sounds. No murmur heard. Pulmonary:     Effort: Pulmonary effort is normal. No respiratory distress.     Breath sounds: Normal breath sounds. No wheezing, rhonchi or rales.  Abdominal:     General: Bowel sounds are normal.     Palpations: Abdomen is soft.     Tenderness: There is no abdominal tenderness. There is no guarding or rebound.  Musculoskeletal:     Cervical back: Neck supple.  Skin:    General: Skin is warm and dry.  Neurological:     Mental Status: She is alert.     Comments: Clear speech, normal thought process, following commands, moving all extremities    ED Results / Procedures / Treatments   Labs (all labs ordered are listed, but only abnormal  results are displayed) Labs Reviewed  CBC - Abnormal; Notable for the following components:      Result Value   RBC 3.80 (*)    Hemoglobin 11.0 (*)    HCT 33.8 (*)    RDW 15.9 (*)    All other components within normal limits  COMPREHENSIVE METABOLIC PANEL - Abnormal; Notable for the following components:   Potassium 5.6 (*)    Glucose, Bld 123 (*)    BUN 38 (*)    Creatinine, Ser 1.90 (*)    AST 44 (*)    GFR, Estimated 26 (*)    All other components within normal limits  BASIC METABOLIC PANEL - Abnormal; Notable for the following components:   Glucose, Bld 121 (*)    BUN 35 (*)    Creatinine, Ser 1.55 (*)    Calcium 8.5 (*)    GFR, Estimated 33 (*)    All other components within normal limits  TROPONIN I (HIGH SENSITIVITY)  TROPONIN I (HIGH SENSITIVITY)    EKG EKG Interpretation  Date/Time:  Saturday November 26 2020 17:29:29 EDT Ventricular Rate:  59 PR Interval:  164 QRS Duration: 151 QT Interval:  477 QTC Calculation: 473 R Axis:   51 Text Interpretation: Sinus rhythm Right bundle branch block No significant change since prior 3/21 Confirmed by Aletta Edouard [phone]) on 11/26/2020 5:39:58 PM  Radiology DG Chest 2 View  Result Date: 11/26/2020 CLINICAL DATA:  Abnormal EKG. EXAM: CHEST - 2 VIEW COMPARISON:  Chest radiographs 07/02/2011 and CT 03/22/2017 FINDINGS: The cardiac silhouette remains mildly enlarged. Aortic atherosclerosis is noted. A rounded density projecting posteriorly over the heart on the lateral radiograph is unchanged and is felt to reflect a pulmonary vein based on CT. No airspace consolidation, edema, pleural effusion, pneumothorax is identified. Degenerative changes are noted at the shoulders. IMPRESSION:  No active cardiopulmonary disease. Electronically Signed   By: Logan Bores M.D.   On: 11/26/2020 18:22    Procedures Procedures    Medications Ordered in ED Medications  LORazepam (ATIVAN) injection 0.25 mg (0.25 mg Intravenous Given 11/26/20  1846)  sodium chloride 0.9 % bolus 500 mL (0 mLs Intravenous Stopped 11/26/20 1927)  sodium chloride 0.9 % bolus 1,000 mL (0 mLs Intravenous Stopped 11/26/20 2107)  amLODipine (NORVASC) tablet 10 mg (10 mg Oral Given 11/26/20 2017)  furosemide (LASIX) tablet 40 mg (40 mg Oral Given 11/26/20 2017)    ED Course  I have reviewed the triage vital signs and the nursing notes.  Pertinent labs & imaging results that were available during my care of the patient were reviewed by me and considered in my medical decision making (see chart for details).  Clinical Course as of 11/26/20 2139  Sat Nov 27, 3615  3370 85 year old female here after feeling off earlier today possible lightheadedness anxious.  Labs with a new AKI hyperkalemia although hemolyzed.  Getting serial troponins hydration repeat BMP.  She is feeling better so if her labs do not show any significant abnormalities likely can be discharged. [MB]    Clinical Course User Index [MB] Hayden Rasmussen, MD   MDM Rules/Calculators/A&P                          85 year old female presents the emergency department today for evaluation of anxiety.  Went to PCP prior to arrival who did an EKG that they thought was abnormal so sent patient here for further evaluation.  On my evaluation patient denies any chest pain, shortness of breath, palpitations or other concerning symptoms other than feeling anxious.  Her CBC was within normal limits.  Her initial CMP showed an elevated creatinine and an elevated potassium however potassium was hemolyzed so this was repeated.  On repeat testing and after giving IV fluids the patient's potassium has normalized and her creatinine is also improved.  Her chest x-ray is reassuring.  Her EKG is unchanged from prior.  She has 2 negative troponins.  She was given a very small dose of Ativan here in the emergency department and on reassessment she states that she is feeling much improved.  I have very low suspicion for ACS,  dissection, esophageal rupture, or other emergent cardiac, pulmonary, or neurologic complaint related to her symptoms today.  Feel that she should follow-up with her PCP for further evaluation of her symptoms today.  Advised on return precautions.  She and daughter at bedside voiced understanding of the plan and reasons to return.  All questions answered.  Patient stable for discharge.  Final Clinical Impression(s) / ED Diagnoses Final diagnoses:  Anxiety    Rx / DC Orders ED Discharge Orders     None        Bishop Dublin 11/26/20 2139    Hayden Rasmussen, MD 11/27/20 1004

## 2020-11-26 NOTE — ED Triage Notes (Signed)
Pt c/o anxiety. Pt was seen by PCP for same and was told to come to ED due to abnormal EKG. Pt denies pain and other complaints.

## 2020-12-02 DIAGNOSIS — R079 Chest pain, unspecified: Secondary | ICD-10-CM | POA: Diagnosis not present

## 2020-12-02 DIAGNOSIS — E1122 Type 2 diabetes mellitus with diabetic chronic kidney disease: Secondary | ICD-10-CM | POA: Diagnosis not present

## 2020-12-02 DIAGNOSIS — N183 Chronic kidney disease, stage 3 unspecified: Secondary | ICD-10-CM | POA: Diagnosis not present

## 2020-12-14 ENCOUNTER — Telehealth (HOSPITAL_COMMUNITY): Payer: Self-pay | Admitting: Cardiology

## 2020-12-14 NOTE — Telephone Encounter (Signed)
Clinical staff from [facility] PCP Dr Sabra Heck called just wit FYI Pt seen in the office and new RBBB seen on EKG-last one done in 2017 with PCP. -pt has been very nervous and anxious- so much so that pt was seen in the ER.  PCP to send office notes. # [phone]

## 2020-12-16 NOTE — Progress Notes (Addendum)
ADVANCED HF CLINIC NOTE  Primary Cardiologist: Dr. Radford Pax HF Cardiologist: Dr. Haroldine Laws  HPI:  [name] is a 85 y.o. female with a history of HTN, DM2, CKD, diastolic HF, OSA, obesity and moderate pulmonary HTN. She denies any h/o known CAD or other heart disease. Non smoker.   She was referred by Dr. Radford Pax for evaluation of [place].   10/18 Hi-res Chest CT. No ILD.   Saw Dr. Radford Pax, excellent CPAP results. The PAP download showed an AHI of 0.4/hr on 10 cm H2O with 73% compliance in using more than 4 hours nightly.   Had surgery for breast CA. No chemo or XRT.   She was seen at her PCP 6/22 for anxiety and sent to the ED after her EKG showed abnormalities. EKG at [facility] showed rBBB with no change from prior, serial trops reassuring, and she received hydration for mild AKI on labs.  Today she returns for HF follow up. Overall feeling fine. Able to do ADLs, but not very active. Still has some trace ankle edema. Denies increasing SOB, CP, dizziness, or PND/Orthopnea. Appetite ok. Weight at home stable, but does not weigh daily. Taking all medications. Struggling with anxiety, requiring ativan. Says she has some palpitations that precede stressful situations. No syncope or pre-syncope. Wears CPAP at night.  Cardiac Studies: - Echo (11/19): EF 60-65% RVSP 64 RV ok. Personally reviewed  - Echo (12/18): EF 97-02% Grade II diastolic dysfunction RV ok RVSP 21mmHG  - Echo (4/18): EF 63-78% Grade II diastolic dysfunction RV ok RVSP 59   - PFTs (5/18): FEV1 0.93 (67%) FVC  1.36 (75%)  DLCO 55%  Past Medical History:  Diagnosis Date   Anemia    takes iron   Arthritis    Dr Ronnie Derby   Carotid artery stenosis    1-39% bilateral dopplers 08/2015   Cataracts, bilateral    Chronic kidney disease    stage III Dr Mercy Moore   Diabetes mellitus    Family history of breast cancer    Family history of breast cancer    Family history of melanoma    GERD (gastroesophageal reflux disease)     Heart murmur    Dr Irish Lack   HTN (hypertension)    Hyperlipidemia    Morbid obesity with BMI of 40.0-44.9, adult ([place])    Obstructive sleep apnea (adult) (pediatric)    w CPAP Severe OSA AHI 52/hr CPAP at 10cm H2O   Pulmonary HTN (HCC)    mild with PASP 34mmHg echo 02/2014   Right carotid bruit 08/19/2014   Shortness of breath    resolved with CPAP   Urination frequency    Vitamin D deficiency disease    Wears dentures    upper   Wears glasses    Current Outpatient Medications  Medication Sig Dispense Refill   acetaminophen (TYLENOL) 500 MG tablet Take 500 mg by mouth every 8 (eight) hours as needed for mild pain, moderate pain, fever or headache.      amLODipine (NORVASC) 10 MG tablet Take 10 mg by mouth daily.  6   aspirin EC 81 MG tablet Take 81 mg by mouth daily.     atorvastatin (LIPITOR) 40 MG tablet Take 40 mg by mouth at bedtime.     Cholecalciferol (VITAMIN D) 2000 UNITS CAPS Take 2,000 Units by mouth daily.     docusate sodium (COLACE) 100 MG capsule Take 100 mg by mouth daily as needed for mild constipation.     Esomeprazole Magnesium  20 MG TBEC Take 20 mg by mouth daily.      ezetimibe (ZETIA) 10 MG tablet Take 10 mg by mouth at bedtime.     furosemide (LASIX) 40 MG tablet Take 40 mg by mouth daily.      LORazepam (ATIVAN) 0.5 MG tablet Take 0.5 mg by mouth 2 (two) times daily.     Multiple Vitamins-Minerals (MULTIVITAMINS THER. W/MINERALS) TABS Take 1 tablet by mouth daily.     pioglitazone (ACTOS) 30 MG tablet Take 30 mg by mouth daily.     spironolactone (ALDACTONE) 25 MG tablet Take 1 tablet (25 mg total) by mouth every other day. Appointment Required For Further Refills [phone] 60 tablet 0   No current facility-administered medications for this encounter.   Allergies  Allergen Reactions   Codeine Itching   Ibuprofen Other (See Comments)    "gave her kidney trouble"   Social History   Socioeconomic History   Marital status: Widowed    Spouse name: Not  on file   Number of children: Not on file   Years of education: Not on file   Highest education level: Not on file  Occupational History   Not on file  Tobacco Use   Smoking status: Never   Smokeless tobacco: Never  Vaping Use   Vaping Use: Never used  Substance and Sexual Activity   Alcohol use: No   Drug use: No   Sexual activity: Not Currently  Other Topics Concern   Not on file  Social History Narrative   Not on file   Social Determinants of Health   Financial Resource Strain: Not on file  Food Insecurity: Not on file  Transportation Needs: Not on file  Physical Activity: Not on file  Stress: Not on file  Social Connections: Not on file  Intimate Partner Violence: Not on file   Family History  Problem Relation Age of Onset   Diabetes Mother    Diabetes Father    Breast cancer Maternal Aunt        unkown age of diagnosis   Melanoma Cousin    BP 128/74   Pulse (!) 54   Wt 88.1 kg (194 lb 3.2 oz)   SpO2 100%   BMI 36.69 kg/m   Wt Readings from Last 3 Encounters:  12/19/20 88.1 kg (194 lb 3.2 oz)  07/26/20 93 kg (205 lb)  04/11/20 92 kg (202 lb 14.4 oz)   PHYSICAL EXAM: General:  NAD. No resp difficulty HEENT: Normal Neck: Supple. No JVD. Carotids 2+ bilat; no bruits. No lymphadenopathy or thryomegaly appreciated. Cor: PMI nondisplaced. Regular rate & rhythm. No rubs, gallops, II/VI TR Lungs: Clear Abdomen: Obese, nontender, nondistended. No hepatosplenomegaly. No bruits or masses. Good bowel sounds. Extremities: No cyanosis, clubbing, rash, trace BL ankle edema Neuro: Alert & oriented x 3, cranial nerves grossly intact. Moves all 4 extremities w/o difficulty. Affect pleasant.  ECG: SB 51 bpm, rBBB qrs 146 ms (personally reviewed).  ASSESSMENT & PLAN:  1. Pulm HTN   - Likely mild to moderate pulmonary HTN due to [facility] Group II (diastolic HF) and III (OSA/OHS/hypoxia) PH. FEV1 0.93 (67%).No role for selective pulmonary vasodilators at this point. - Echo  (2/18) with no evidence of RV strain. RVSP estimated at 78mmHG which is stable.  - Echo (11/19): EF 60-65% RVSP 64 RV ok. Personally reviewed. - Functional status improved with pulmonary rehab. Now off O2. - PFTs reviewed by Dr. Haroldine Laws and shows mixed obstructive/restrictive picture with FEV1 0.93 (67%).  Hi-res CT - No ILD - Continue CPAP. - Continue lasix 40 mg daily. - Continue spiro 25 mg daily.  - Overall stable NYHA II-III. Pulmonary pressures stable on echo. Suspect mainly WHO Group 3. Dr. Haroldine Laws discussed possibility of RHC, but doubt it would change management much and she would prefer to avoid.  - Will continue current management.  - BMET today.  2. Chronic hypoxic respiratory failure - Multifactorial. PFTs reviewed by Dr. Haroldine Laws. Mixed obstructive/restrictive picture. FEV1 only 0.93. - Improved with pulmonary rehab. Now off O2. - Has seen Dr. Lake Bells, Hi-res chest CT showed no ILD.  3. OSA  - Continue CPAP. - Weight loss would help.  4. Breast cancer - S/p resection.  5. Palpitations - ECG stable compared to previous. - Place Zio Patch 14 days to quantify arrhythmias or PVCs. - Avoid caffeine. - Check labs today.  Follow up in 3-4 months w/ Dr. Haroldine Laws.  [place], FNP-BC 12/19/20

## 2020-12-19 ENCOUNTER — Encounter (HOSPITAL_COMMUNITY): Payer: Self-pay

## 2020-12-19 ENCOUNTER — Other Ambulatory Visit: Payer: Self-pay

## 2020-12-19 ENCOUNTER — Ambulatory Visit (HOSPITAL_COMMUNITY)
Admission: RE | Admit: 2020-12-19 | Discharge: 2020-12-19 | Disposition: A | Discharge status: Home / Self Care | Payer: Medicare Other | Source: Ambulatory Visit | Attending: Family Medicine | Admitting: Family Medicine

## 2020-12-19 VITALS — BP 128/74 | HR 54 | Wt 194.2 lb

## 2020-12-19 DIAGNOSIS — Z79899 Other long term (current) drug therapy: Secondary | ICD-10-CM | POA: Insufficient documentation

## 2020-12-19 DIAGNOSIS — I4510 Unspecified right bundle-branch block: Secondary | ICD-10-CM | POA: Insufficient documentation

## 2020-12-19 DIAGNOSIS — I272 Pulmonary hypertension, unspecified: Secondary | ICD-10-CM | POA: Diagnosis not present

## 2020-12-19 DIAGNOSIS — E785 Hyperlipidemia, unspecified: Secondary | ICD-10-CM | POA: Insufficient documentation

## 2020-12-19 DIAGNOSIS — J9611 Chronic respiratory failure with hypoxia: Secondary | ICD-10-CM | POA: Diagnosis not present

## 2020-12-19 DIAGNOSIS — Z885 Allergy status to narcotic agent status: Secondary | ICD-10-CM | POA: Diagnosis not present

## 2020-12-19 DIAGNOSIS — Z7982 Long term (current) use of aspirin: Secondary | ICD-10-CM | POA: Diagnosis not present

## 2020-12-19 DIAGNOSIS — E1122 Type 2 diabetes mellitus with diabetic chronic kidney disease: Secondary | ICD-10-CM | POA: Insufficient documentation

## 2020-12-19 DIAGNOSIS — R002 Palpitations: Secondary | ICD-10-CM | POA: Diagnosis not present

## 2020-12-19 DIAGNOSIS — Z7984 Long term (current) use of oral hypoglycemic drugs: Secondary | ICD-10-CM | POA: Diagnosis not present

## 2020-12-19 DIAGNOSIS — N189 Chronic kidney disease, unspecified: Secondary | ICD-10-CM | POA: Insufficient documentation

## 2020-12-19 DIAGNOSIS — Z886 Allergy status to analgesic agent status: Secondary | ICD-10-CM | POA: Insufficient documentation

## 2020-12-19 DIAGNOSIS — Z853 Personal history of malignant neoplasm of breast: Secondary | ICD-10-CM | POA: Insufficient documentation

## 2020-12-19 DIAGNOSIS — I130 Hypertensive heart and chronic kidney disease with heart failure and stage 1 through stage 4 chronic kidney disease, or unspecified chronic kidney disease: Secondary | ICD-10-CM | POA: Insufficient documentation

## 2020-12-19 DIAGNOSIS — G4733 Obstructive sleep apnea (adult) (pediatric): Secondary | ICD-10-CM | POA: Diagnosis not present

## 2020-12-19 DIAGNOSIS — I5032 Chronic diastolic (congestive) heart failure: Secondary | ICD-10-CM | POA: Diagnosis not present

## 2020-12-19 LAB — MAGNESIUM: Magnesium: 2.1 mg/dL (ref 1.7–2.4)

## 2020-12-19 LAB — BASIC METABOLIC PANEL
Anion gap: 12 (ref 5–15)
BUN: 40 mg/dL — ABNORMAL HIGH (ref 8–23)
CO2: 26 mmol/L (ref 22–32)
Calcium: 9.6 mg/dL (ref 8.9–10.3)
Chloride: 97 mmol/L — ABNORMAL LOW (ref 98–111)
Creatinine, Ser: 1.75 mg/dL — ABNORMAL HIGH (ref 0.44–1.00)
GFR, Estimated: 29 mL/min — ABNORMAL LOW (ref >60)
Glucose, Bld: 113 mg/dL — ABNORMAL HIGH (ref 70–99)
Potassium: 4.2 mmol/L (ref 3.5–5.1)
Sodium: 135 mmol/L (ref 135–145)

## 2020-12-19 NOTE — Patient Instructions (Signed)
It was great to see you today! No medication changes are needed at this time.  Labs today We will only contact you if something comes back abnormal or we need to make some changes. Otherwise no news is good news!  Your physician recommends that you schedule a follow-up appointment in: 3-4 months with Dr Haroldine Laws  Do the following things EVERYDAY: Weigh yourself in the morning before breakfast. Write it down and keep it in a log. Take your medicines as prescribed Eat low salt foods--Limit salt (sodium) to 2000 mg per day.  Stay as active as you can everyday Limit all fluids for the day to less than 2 liters  milAt the [facility], you and your health needs are our priority. As part of our continuing mission to provide you with exceptional heart care, we have created designated Provider Care Teams. These Care Teams include your primary Cardiologist (physician) and Advanced Practice Providers (APPs- Physician Assistants and Nurse Practitioners) who all work together to provide you with the care you need, when you need it.   You may see any of the following providers on your designated Care Team at your next follow up: Dr Glori Bickers Dr Loralie Champagne Dr Patrice Paradise, NP Lyda Jester, [place] Ginnie Smart Audry Riles, PharmD   Please be sure to bring in all your medications bottles to every appointment.

## 2020-12-21 ENCOUNTER — Other Ambulatory Visit (HOSPITAL_COMMUNITY): Payer: Self-pay | Admitting: Internal Medicine

## 2020-12-21 ENCOUNTER — Telehealth (HOSPITAL_COMMUNITY): Payer: Self-pay | Admitting: Cardiology

## 2020-12-21 DIAGNOSIS — I272 Pulmonary hypertension, unspecified: Secondary | ICD-10-CM

## 2020-12-21 MED ORDER — FUROSEMIDE 40 MG PO TABS: 20.0000 mg | ORAL_TABLET | Freq: Every day | ORAL | 3 refills | Status: DC

## 2020-12-21 NOTE — Telephone Encounter (Signed)
Patient called.  Patient aware.   [phone] (H)

## 2020-12-21 NOTE — Telephone Encounter (Signed)
-----   Message from Rafael Bihari, [place] sent at 12/19/2020  2:02 PM EDT ----- Kidney function elevated. Please decrease spiro to 12.5 mg daily. Repeat BMET in 2 weeks.

## 2020-12-22 DIAGNOSIS — C50019 Malignant neoplasm of nipple and areola, unspecified female breast: Secondary | ICD-10-CM | POA: Diagnosis not present

## 2020-12-22 DIAGNOSIS — D638 Anemia in other chronic diseases classified elsewhere: Secondary | ICD-10-CM | POA: Diagnosis not present

## 2020-12-22 DIAGNOSIS — H35033 Hypertensive retinopathy, bilateral: Secondary | ICD-10-CM | POA: Diagnosis not present

## 2020-12-22 DIAGNOSIS — I10 Essential (primary) hypertension: Secondary | ICD-10-CM | POA: Diagnosis not present

## 2020-12-22 DIAGNOSIS — N183 Chronic kidney disease, stage 3 unspecified: Secondary | ICD-10-CM | POA: Diagnosis not present

## 2020-12-22 DIAGNOSIS — D509 Iron deficiency anemia, unspecified: Secondary | ICD-10-CM | POA: Diagnosis not present

## 2020-12-22 DIAGNOSIS — E1122 Type 2 diabetes mellitus with diabetic chronic kidney disease: Secondary | ICD-10-CM | POA: Diagnosis not present

## 2020-12-22 DIAGNOSIS — E7800 Pure hypercholesterolemia, unspecified: Secondary | ICD-10-CM | POA: Diagnosis not present

## 2020-12-23 ENCOUNTER — Telehealth (HOSPITAL_COMMUNITY): Payer: Self-pay | Admitting: Family Medicine

## 2020-12-23 NOTE — Telephone Encounter (Signed)
Called patient do discuss lab results and medication changes. No answer, LMTCB.  Allena Katz, FNP-BC

## 2021-01-03 ENCOUNTER — Other Ambulatory Visit: Payer: Self-pay

## 2021-01-03 ENCOUNTER — Encounter (HOSPITAL_COMMUNITY): Payer: Self-pay

## 2021-01-03 ENCOUNTER — Ambulatory Visit (HOSPITAL_COMMUNITY)
Admission: RE | Admit: 2021-01-03 | Discharge: 2021-01-03 | Disposition: A | Discharge status: Home / Self Care | Payer: Medicare Other | Source: Ambulatory Visit | Attending: Internal Medicine | Admitting: Internal Medicine

## 2021-01-03 DIAGNOSIS — I272 Pulmonary hypertension, unspecified: Secondary | ICD-10-CM

## 2021-01-05 DIAGNOSIS — I776 Arteritis, unspecified: Secondary | ICD-10-CM | POA: Diagnosis not present

## 2021-01-05 DIAGNOSIS — H9209 Otalgia, unspecified ear: Secondary | ICD-10-CM | POA: Diagnosis not present

## 2021-01-05 DIAGNOSIS — I272 Pulmonary hypertension, unspecified: Secondary | ICD-10-CM | POA: Diagnosis not present

## 2021-01-05 DIAGNOSIS — I471 Supraventricular tachycardia: Secondary | ICD-10-CM | POA: Diagnosis not present

## 2021-01-18 DIAGNOSIS — G4733 Obstructive sleep apnea (adult) (pediatric): Secondary | ICD-10-CM | POA: Diagnosis not present

## 2021-01-22 NOTE — Addendum Note (Signed)
Encounter addended by: Rafael Bihari, FNP on: 01/22/2021 1:57 PM  Actions taken: Clinical Note Signed

## 2021-01-23 DIAGNOSIS — I10 Essential (primary) hypertension: Secondary | ICD-10-CM | POA: Diagnosis not present

## 2021-01-23 DIAGNOSIS — H35033 Hypertensive retinopathy, bilateral: Secondary | ICD-10-CM | POA: Diagnosis not present

## 2021-01-23 DIAGNOSIS — D509 Iron deficiency anemia, unspecified: Secondary | ICD-10-CM | POA: Diagnosis not present

## 2021-01-23 DIAGNOSIS — N183 Chronic kidney disease, stage 3 unspecified: Secondary | ICD-10-CM | POA: Diagnosis not present

## 2021-01-23 DIAGNOSIS — E1122 Type 2 diabetes mellitus with diabetic chronic kidney disease: Secondary | ICD-10-CM | POA: Diagnosis not present

## 2021-01-23 DIAGNOSIS — D638 Anemia in other chronic diseases classified elsewhere: Secondary | ICD-10-CM | POA: Diagnosis not present

## 2021-01-23 DIAGNOSIS — C50019 Malignant neoplasm of nipple and areola, unspecified female breast: Secondary | ICD-10-CM | POA: Diagnosis not present

## 2021-01-23 DIAGNOSIS — E7800 Pure hypercholesterolemia, unspecified: Secondary | ICD-10-CM | POA: Diagnosis not present

## 2021-02-01 DIAGNOSIS — E7800 Pure hypercholesterolemia, unspecified: Secondary | ICD-10-CM | POA: Diagnosis not present

## 2021-02-01 DIAGNOSIS — E1122 Type 2 diabetes mellitus with diabetic chronic kidney disease: Secondary | ICD-10-CM | POA: Diagnosis not present

## 2021-02-01 DIAGNOSIS — Z23 Encounter for immunization: Secondary | ICD-10-CM | POA: Diagnosis not present

## 2021-02-01 DIAGNOSIS — N183 Chronic kidney disease, stage 3 unspecified: Secondary | ICD-10-CM | POA: Diagnosis not present

## 2021-02-01 DIAGNOSIS — Z0000 Encounter for general adult medical examination without abnormal findings: Secondary | ICD-10-CM | POA: Diagnosis not present

## 2021-02-01 DIAGNOSIS — I10 Essential (primary) hypertension: Secondary | ICD-10-CM | POA: Diagnosis not present

## 2021-02-01 DIAGNOSIS — I471 Supraventricular tachycardia: Secondary | ICD-10-CM | POA: Diagnosis not present

## 2021-02-10 DIAGNOSIS — N183 Chronic kidney disease, stage 3 unspecified: Secondary | ICD-10-CM | POA: Diagnosis not present

## 2021-02-10 DIAGNOSIS — E1122 Type 2 diabetes mellitus with diabetic chronic kidney disease: Secondary | ICD-10-CM | POA: Diagnosis not present

## 2021-02-10 DIAGNOSIS — E7800 Pure hypercholesterolemia, unspecified: Secondary | ICD-10-CM | POA: Diagnosis not present

## 2021-02-10 DIAGNOSIS — H35033 Hypertensive retinopathy, bilateral: Secondary | ICD-10-CM | POA: Diagnosis not present

## 2021-02-10 DIAGNOSIS — I10 Essential (primary) hypertension: Secondary | ICD-10-CM | POA: Diagnosis not present

## 2021-02-10 DIAGNOSIS — D638 Anemia in other chronic diseases classified elsewhere: Secondary | ICD-10-CM | POA: Diagnosis not present

## 2021-02-13 ENCOUNTER — Other Ambulatory Visit (HOSPITAL_COMMUNITY): Payer: Self-pay | Admitting: Internal Medicine

## 2021-02-13 DIAGNOSIS — N1832 Chronic kidney disease, stage 3b: Secondary | ICD-10-CM | POA: Diagnosis not present

## 2021-02-21 DIAGNOSIS — M9904 Segmental and somatic dysfunction of sacral region: Secondary | ICD-10-CM | POA: Diagnosis not present

## 2021-02-21 DIAGNOSIS — M9903 Segmental and somatic dysfunction of lumbar region: Secondary | ICD-10-CM | POA: Diagnosis not present

## 2021-02-21 DIAGNOSIS — M5136 Other intervertebral disc degeneration, lumbar region: Secondary | ICD-10-CM | POA: Diagnosis not present

## 2021-02-21 DIAGNOSIS — M9905 Segmental and somatic dysfunction of pelvic region: Secondary | ICD-10-CM | POA: Diagnosis not present

## 2021-02-22 DIAGNOSIS — N2581 Secondary hyperparathyroidism of renal origin: Secondary | ICD-10-CM | POA: Diagnosis not present

## 2021-02-22 DIAGNOSIS — D631 Anemia in chronic kidney disease: Secondary | ICD-10-CM | POA: Diagnosis not present

## 2021-02-22 DIAGNOSIS — E1122 Type 2 diabetes mellitus with diabetic chronic kidney disease: Secondary | ICD-10-CM | POA: Diagnosis not present

## 2021-02-22 DIAGNOSIS — N1832 Chronic kidney disease, stage 3b: Secondary | ICD-10-CM | POA: Diagnosis not present

## 2021-02-22 DIAGNOSIS — I129 Hypertensive chronic kidney disease with stage 1 through stage 4 chronic kidney disease, or unspecified chronic kidney disease: Secondary | ICD-10-CM | POA: Diagnosis not present

## 2021-02-23 DIAGNOSIS — M9905 Segmental and somatic dysfunction of pelvic region: Secondary | ICD-10-CM | POA: Diagnosis not present

## 2021-02-23 DIAGNOSIS — M5136 Other intervertebral disc degeneration, lumbar region: Secondary | ICD-10-CM | POA: Diagnosis not present

## 2021-02-23 DIAGNOSIS — M9903 Segmental and somatic dysfunction of lumbar region: Secondary | ICD-10-CM | POA: Diagnosis not present

## 2021-02-23 DIAGNOSIS — M9904 Segmental and somatic dysfunction of sacral region: Secondary | ICD-10-CM | POA: Diagnosis not present

## 2021-02-27 DIAGNOSIS — M9903 Segmental and somatic dysfunction of lumbar region: Secondary | ICD-10-CM | POA: Diagnosis not present

## 2021-02-27 DIAGNOSIS — M5136 Other intervertebral disc degeneration, lumbar region: Secondary | ICD-10-CM | POA: Diagnosis not present

## 2021-02-27 DIAGNOSIS — M9904 Segmental and somatic dysfunction of sacral region: Secondary | ICD-10-CM | POA: Diagnosis not present

## 2021-02-27 DIAGNOSIS — M9905 Segmental and somatic dysfunction of pelvic region: Secondary | ICD-10-CM | POA: Diagnosis not present

## 2021-02-28 DIAGNOSIS — M9903 Segmental and somatic dysfunction of lumbar region: Secondary | ICD-10-CM | POA: Diagnosis not present

## 2021-02-28 DIAGNOSIS — M9905 Segmental and somatic dysfunction of pelvic region: Secondary | ICD-10-CM | POA: Diagnosis not present

## 2021-02-28 DIAGNOSIS — M9904 Segmental and somatic dysfunction of sacral region: Secondary | ICD-10-CM | POA: Diagnosis not present

## 2021-02-28 DIAGNOSIS — M5136 Other intervertebral disc degeneration, lumbar region: Secondary | ICD-10-CM | POA: Diagnosis not present

## 2021-03-02 DIAGNOSIS — M9905 Segmental and somatic dysfunction of pelvic region: Secondary | ICD-10-CM | POA: Diagnosis not present

## 2021-03-02 DIAGNOSIS — M9904 Segmental and somatic dysfunction of sacral region: Secondary | ICD-10-CM | POA: Diagnosis not present

## 2021-03-02 DIAGNOSIS — M5136 Other intervertebral disc degeneration, lumbar region: Secondary | ICD-10-CM | POA: Diagnosis not present

## 2021-03-02 DIAGNOSIS — M9903 Segmental and somatic dysfunction of lumbar region: Secondary | ICD-10-CM | POA: Diagnosis not present

## 2021-03-07 DIAGNOSIS — M9903 Segmental and somatic dysfunction of lumbar region: Secondary | ICD-10-CM | POA: Diagnosis not present

## 2021-03-07 DIAGNOSIS — M9905 Segmental and somatic dysfunction of pelvic region: Secondary | ICD-10-CM | POA: Diagnosis not present

## 2021-03-07 DIAGNOSIS — M5136 Other intervertebral disc degeneration, lumbar region: Secondary | ICD-10-CM | POA: Diagnosis not present

## 2021-03-07 DIAGNOSIS — M9904 Segmental and somatic dysfunction of sacral region: Secondary | ICD-10-CM | POA: Diagnosis not present

## 2021-03-15 DIAGNOSIS — H9202 Otalgia, left ear: Secondary | ICD-10-CM | POA: Diagnosis not present

## 2021-03-21 ENCOUNTER — Encounter (HOSPITAL_COMMUNITY): Payer: Self-pay | Admitting: Internal Medicine

## 2021-03-21 ENCOUNTER — Ambulatory Visit (HOSPITAL_COMMUNITY)
Admission: RE | Admit: 2021-03-21 | Discharge: 2021-03-21 | Disposition: A | Discharge status: Home / Self Care | Payer: Medicare Other | Source: Ambulatory Visit | Attending: Internal Medicine | Admitting: Internal Medicine

## 2021-03-21 ENCOUNTER — Other Ambulatory Visit: Payer: Self-pay

## 2021-03-21 VITALS — BP 140/64 | HR 57 | Wt 194.8 lb

## 2021-03-21 DIAGNOSIS — R002 Palpitations: Secondary | ICD-10-CM | POA: Insufficient documentation

## 2021-03-21 DIAGNOSIS — Z833 Family history of diabetes mellitus: Secondary | ICD-10-CM | POA: Diagnosis not present

## 2021-03-21 DIAGNOSIS — I130 Hypertensive heart and chronic kidney disease with heart failure and stage 1 through stage 4 chronic kidney disease, or unspecified chronic kidney disease: Secondary | ICD-10-CM | POA: Insufficient documentation

## 2021-03-21 DIAGNOSIS — Z7982 Long term (current) use of aspirin: Secondary | ICD-10-CM | POA: Insufficient documentation

## 2021-03-21 DIAGNOSIS — Z79899 Other long term (current) drug therapy: Secondary | ICD-10-CM | POA: Diagnosis not present

## 2021-03-21 DIAGNOSIS — E1122 Type 2 diabetes mellitus with diabetic chronic kidney disease: Secondary | ICD-10-CM | POA: Insufficient documentation

## 2021-03-21 DIAGNOSIS — Z853 Personal history of malignant neoplasm of breast: Secondary | ICD-10-CM | POA: Diagnosis not present

## 2021-03-21 DIAGNOSIS — Z885 Allergy status to narcotic agent status: Secondary | ICD-10-CM | POA: Insufficient documentation

## 2021-03-21 DIAGNOSIS — J9611 Chronic respiratory failure with hypoxia: Secondary | ICD-10-CM | POA: Insufficient documentation

## 2021-03-21 DIAGNOSIS — I4510 Unspecified right bundle-branch block: Secondary | ICD-10-CM | POA: Diagnosis not present

## 2021-03-21 DIAGNOSIS — I272 Pulmonary hypertension, unspecified: Secondary | ICD-10-CM | POA: Diagnosis not present

## 2021-03-21 DIAGNOSIS — Z886 Allergy status to analgesic agent status: Secondary | ICD-10-CM | POA: Insufficient documentation

## 2021-03-21 DIAGNOSIS — G4733 Obstructive sleep apnea (adult) (pediatric): Secondary | ICD-10-CM | POA: Diagnosis not present

## 2021-03-21 DIAGNOSIS — N184 Chronic kidney disease, stage 4 (severe): Secondary | ICD-10-CM | POA: Diagnosis not present

## 2021-03-21 DIAGNOSIS — I5032 Chronic diastolic (congestive) heart failure: Secondary | ICD-10-CM | POA: Diagnosis not present

## 2021-03-21 DIAGNOSIS — Z7984 Long term (current) use of oral hypoglycemic drugs: Secondary | ICD-10-CM | POA: Insufficient documentation

## 2021-03-21 DIAGNOSIS — Z6841 Body Mass Index (BMI) 40.0 and over, adult: Secondary | ICD-10-CM

## 2021-03-21 DIAGNOSIS — Z803 Family history of malignant neoplasm of breast: Secondary | ICD-10-CM | POA: Insufficient documentation

## 2021-03-21 NOTE — Addendum Note (Signed)
Encounter addended by: Shonna Chock, CMA on: 97/94/9971 11:39 AM  Actions taken: Clinical Note Signed

## 2021-03-21 NOTE — Progress Notes (Addendum)
ADVANCED HF CLINIC NOTE  Primary Cardiologist: Dr. Radford Pax HF Cardiologist: Dr. Haroldine Laws  HPI:  [name] is an 85 y.o. female with a history of HTN, DM2, CKD, diastolic HF, OSA, obesity and moderate pulmonary HTN. She denies any h/o known CAD or other heart disease. Non smoker.   She was referred by Dr. Radford Pax for evaluation of [place].   10/18 Hi-res Chest CT. No ILD.   Had surgery for breast CA. No chemo or XRT.   She was seen at her PCP 6/22 for anxiety and sent to the ED after her EKG showed abnormalities. EKG at [facility] showed RBBB with no change from prior, serial trops reassuring, and she received hydration for mild AKI on labs.  Today she returns for HF follow up. Doing fairly well. Compliant with CPAP. Able to go to store without too much problem. No CP, orthopnea or PND. Stable exertional edema. Mild dependent edema.    Cardiac Studies: - Echo (11/19): EF 60-65% RVSP 64 RV ok. Personally reviewed  - Echo (12/18): EF 32-44% Grade II diastolic dysfunction RV ok RVSP 78mmHG  - Echo (4/18): EF 01-02% Grade II diastolic dysfunction RV ok RVSP 59   - PFTs (5/18): FEV1 0.93 (67%) FVC  1.36 (75%)  DLCO 55%  Past Medical History:  Diagnosis Date   Anemia    takes iron   Arthritis    Dr Ronnie Derby   Carotid artery stenosis    1-39% bilateral dopplers 08/2015   Cataracts, bilateral    Chronic kidney disease    stage III Dr Mercy Moore   Diabetes mellitus    Family history of breast cancer    Family history of breast cancer    Family history of melanoma    GERD (gastroesophageal reflux disease)    Heart murmur    Dr Irish Lack   HTN (hypertension)    Hyperlipidemia    Morbid obesity with BMI of 40.0-44.9, adult ([place])    Obstructive sleep apnea (adult) (pediatric)    w CPAP Severe OSA AHI 52/hr CPAP at 10cm H2O   Pulmonary HTN (HCC)    mild with PASP 64mmHg echo 02/2014   Right carotid bruit 08/19/2014   Shortness of breath    resolved with CPAP   Urination frequency     Vitamin D deficiency disease    Wears dentures    upper   Wears glasses    Current Outpatient Medications  Medication Sig Dispense Refill   acetaminophen (TYLENOL) 500 MG tablet Take 500 mg by mouth every 8 (eight) hours as needed for mild pain, moderate pain, fever or headache.      amLODipine (NORVASC) 10 MG tablet Take 10 mg by mouth daily.  6   amoxicillin (AMOXIL) 875 MG tablet Take 875 mg by mouth 2 (two) times daily.     aspirin EC 81 MG tablet Take 81 mg by mouth daily.     atorvastatin (LIPITOR) 40 MG tablet Take 40 mg by mouth at bedtime.     Cholecalciferol (VITAMIN D) 2000 UNITS CAPS Take 2,000 Units by mouth daily.     docusate sodium (COLACE) 100 MG capsule Take 100 mg by mouth daily as needed for mild constipation.     Esomeprazole Magnesium 20 MG TBEC Take 20 mg by mouth daily.      ezetimibe (ZETIA) 10 MG tablet Take 10 mg by mouth at bedtime.     furosemide (LASIX) 40 MG tablet Take 0.5 tablets (20 mg total) by mouth daily.  15 tablet 3   LORazepam (ATIVAN) 0.5 MG tablet Take 0.5 mg by mouth 2 (two) times daily. As needed     Multiple Vitamins-Minerals (MULTIVITAMINS THER. W/MINERALS) TABS Take 1 tablet by mouth daily.     pioglitazone (ACTOS) 30 MG tablet Take 30 mg by mouth daily.     spironolactone (ALDACTONE) 25 MG tablet TAKE 1 TABLET BY MOUTH  EVERY OTHER DAY 45 tablet 6   No current facility-administered medications for this encounter.   Allergies  Allergen Reactions   Codeine Itching   Ibuprofen Other (See Comments)    "gave her kidney trouble"   Social History   Socioeconomic History   Marital status: Widowed    Spouse name: Not on file   Number of children: Not on file   Years of education: Not on file   Highest education level: Not on file  Occupational History   Not on file  Tobacco Use   Smoking status: Never   Smokeless tobacco: Never  Vaping Use   Vaping Use: Never used  Substance and Sexual Activity   Alcohol use: No   Drug use: No    Sexual activity: Not Currently  Other Topics Concern   Not on file  Social History Narrative   Not on file   Social Determinants of Health   Financial Resource Strain: Not on file  Food Insecurity: Not on file  Transportation Needs: Not on file  Physical Activity: Not on file  Stress: Not on file  Social Connections: Not on file  Intimate Partner Violence: Not on file   Family History  Problem Relation Age of Onset   Diabetes Mother    Diabetes Father    Breast cancer Maternal Aunt        unkown age of diagnosis   Melanoma Cousin    BP 140/64   Pulse (!) 57   Wt 88.4 kg (194 lb 12.8 oz)   SpO2 98%   BMI 36.81 kg/m   Wt Readings from Last 3 Encounters:  03/21/21 88.4 kg (194 lb 12.8 oz)  12/19/20 88.1 kg (194 lb 3.2 oz)  07/26/20 93 kg (205 lb)   PHYSICAL EXAM: General:  Elderly . No resp difficulty HEENT: normal Neck: supple. no JVD. Carotids 2+ bilat; no bruits. No lymphadenopathy or thryomegaly appreciated. Cor: PMI nondisplaced. Regular rate & rhythm. No rubs, gallops or murmurs. Lungs: clear Abdomen: soft, nontender, nondistended. No hepatosplenomegaly. No bruits or masses. Good bowel sounds. Extremities: no cyanosis, clubbing, rash, edema Neuro: alert & orientedx3, cranial nerves grossly intact. moves all 4 extremities w/o difficulty. Affect pleasant   ECG: SB 52 bpm, rBBB qrs 142 ms (Personally reviewed   ASSESSMENT & PLAN:  1. Pulm HTN   - Likely mild to moderate pulmonary HTN due to [facility] Group II (diastolic HF) and III (OSA/OHS/hypoxia) PH. FEV1 0.93 (67%).No role for selective pulmonary vasodilators at this point. - Echo (2/18) with no evidence of RV strain. RVSP estimated at 13mmHG which is stable.  - Echo (11/19): EF 60-65% RVSP 64 RV ok. Personally reviewed. - Functional status improved with pulmonary rehab. Now off O2. - PFTs show mixed obstructive/restrictive picture with FEV1 0.93 (67%). Hi-res CT - No ILD - Continue CPAP. - Continue lasix 20  mg daily. - Continue spiro 25 mg daily.  - Overall stable NYHA II-III. Pulmonary pressures stable on echo. Suspect mainly WHO Group 3. - Will continue current management.  - Recent labs with Nephrology reviewed  2. Chronic hypoxic respiratory failure -  Multifactorial. PFTs reviewed by Dr. Haroldine Laws. Mixed obstructive/restrictive picture. FEV1 only 0.93. - Improved with pulmonary rehab. Now off O2. - Has seen Dr. Lake Bells, Hi-res chest CT showed no ILD.  3. OSA  - Continue CPAP. - Followed by Radford Pax. Downloads have looked good.   4. Breast cancer - S/p resection.  5. Palpitations - ECG stable compared to previous. - Zio 8/22 Sinus rhythm occasional brief SVT  6. DM2 - followed by PCP - consider switching ACTOS to [id number] or SGLT2i   7. CKD IV - followed by Dr. Fernanda Drum, MD 03/21/21

## 2021-03-21 NOTE — Patient Instructions (Signed)
No Labs done today.   No medication changes were made. Please continue all current medications as prescribed.  Your physician recommends that you schedule a follow-up appointment in: 6 months with our NP/PA Clinic. Please contact our office in March 2023 to schedule a April 2023 appointment.  If you have any questions or concerns before your next appointment please send [place] a message through [place] or call our office at [phone].    TO LEAVE A MESSAGE FOR THE NURSE SELECT OPTION 2, PLEASE LEAVE A MESSAGE INCLUDING: YOUR NAME DATE OF BIRTH CALL BACK NUMBER REASON FOR CALL**this is important as we prioritize the call backs  YOU WILL RECEIVE A CALL BACK THE SAME DAY AS LONG AS YOU CALL BEFORE 4:00 PM   Do the following things EVERYDAY: Weigh yourself in the morning before breakfast. Write it down and keep it in a log. Take your medicines as prescribed Eat low salt foods--Limit salt (sodium) to 2000 mg per day.  Stay as active as you can everyday Limit all fluids for the day to less than 2 liters   At the [facility], you and your health needs are our priority. As part of our continuing mission to provide you with exceptional heart care, we have created designated Provider Care Teams. These Care Teams include your primary Cardiologist (physician) and Advanced Practice Providers (APPs- Physician Assistants and Nurse Practitioners) who all work together to provide you with the care you need, when you need it.   You may see any of the following providers on your designated Care Team at your next follow up: Dr Glori Bickers Dr Haynes Kerns, NP Lyda Jester, [place] Audry Riles, PharmD   Please be sure to bring in all your medications bottles to every appointment.

## 2021-03-27 ENCOUNTER — Other Ambulatory Visit: Payer: Self-pay | Admitting: Oncology

## 2021-03-27 DIAGNOSIS — Z853 Personal history of malignant neoplasm of breast: Secondary | ICD-10-CM

## 2021-03-28 DIAGNOSIS — S99922A Unspecified injury of left foot, initial encounter: Secondary | ICD-10-CM | POA: Diagnosis not present

## 2021-03-29 DIAGNOSIS — Z96659 Presence of unspecified artificial knee joint: Secondary | ICD-10-CM | POA: Diagnosis not present

## 2021-03-29 DIAGNOSIS — E1122 Type 2 diabetes mellitus with diabetic chronic kidney disease: Secondary | ICD-10-CM | POA: Diagnosis not present

## 2021-03-29 DIAGNOSIS — N183 Chronic kidney disease, stage 3 unspecified: Secondary | ICD-10-CM | POA: Diagnosis not present

## 2021-03-30 ENCOUNTER — Other Ambulatory Visit (HOSPITAL_COMMUNITY): Payer: Self-pay | Admitting: Family Medicine

## 2021-03-31 DIAGNOSIS — C50019 Malignant neoplasm of nipple and areola, unspecified female breast: Secondary | ICD-10-CM | POA: Diagnosis not present

## 2021-03-31 DIAGNOSIS — N183 Chronic kidney disease, stage 3 unspecified: Secondary | ICD-10-CM | POA: Diagnosis not present

## 2021-03-31 DIAGNOSIS — E1122 Type 2 diabetes mellitus with diabetic chronic kidney disease: Secondary | ICD-10-CM | POA: Diagnosis not present

## 2021-03-31 DIAGNOSIS — E7800 Pure hypercholesterolemia, unspecified: Secondary | ICD-10-CM | POA: Diagnosis not present

## 2021-03-31 DIAGNOSIS — I129 Hypertensive chronic kidney disease with stage 1 through stage 4 chronic kidney disease, or unspecified chronic kidney disease: Secondary | ICD-10-CM | POA: Diagnosis not present

## 2021-03-31 DIAGNOSIS — D638 Anemia in other chronic diseases classified elsewhere: Secondary | ICD-10-CM | POA: Diagnosis not present

## 2021-03-31 DIAGNOSIS — H35033 Hypertensive retinopathy, bilateral: Secondary | ICD-10-CM | POA: Diagnosis not present

## 2021-03-31 DIAGNOSIS — D509 Iron deficiency anemia, unspecified: Secondary | ICD-10-CM | POA: Diagnosis not present

## 2021-03-31 DIAGNOSIS — I10 Essential (primary) hypertension: Secondary | ICD-10-CM | POA: Diagnosis not present

## 2021-04-12 DIAGNOSIS — D509 Iron deficiency anemia, unspecified: Secondary | ICD-10-CM | POA: Diagnosis not present

## 2021-04-12 DIAGNOSIS — H35033 Hypertensive retinopathy, bilateral: Secondary | ICD-10-CM | POA: Diagnosis not present

## 2021-04-12 DIAGNOSIS — I10 Essential (primary) hypertension: Secondary | ICD-10-CM | POA: Diagnosis not present

## 2021-04-12 DIAGNOSIS — D638 Anemia in other chronic diseases classified elsewhere: Secondary | ICD-10-CM | POA: Diagnosis not present

## 2021-04-12 DIAGNOSIS — E7800 Pure hypercholesterolemia, unspecified: Secondary | ICD-10-CM | POA: Diagnosis not present

## 2021-04-12 DIAGNOSIS — I129 Hypertensive chronic kidney disease with stage 1 through stage 4 chronic kidney disease, or unspecified chronic kidney disease: Secondary | ICD-10-CM | POA: Diagnosis not present

## 2021-04-12 DIAGNOSIS — N183 Chronic kidney disease, stage 3 unspecified: Secondary | ICD-10-CM | POA: Diagnosis not present

## 2021-04-12 DIAGNOSIS — E1122 Type 2 diabetes mellitus with diabetic chronic kidney disease: Secondary | ICD-10-CM | POA: Diagnosis not present

## 2021-05-08 DIAGNOSIS — H20022 Recurrent acute iridocyclitis, left eye: Secondary | ICD-10-CM | POA: Diagnosis not present

## 2021-05-08 DIAGNOSIS — H04123 Dry eye syndrome of bilateral lacrimal glands: Secondary | ICD-10-CM | POA: Diagnosis not present

## 2021-05-08 DIAGNOSIS — E119 Type 2 diabetes mellitus without complications: Secondary | ICD-10-CM | POA: Diagnosis not present

## 2021-05-08 DIAGNOSIS — H2513 Age-related nuclear cataract, bilateral: Secondary | ICD-10-CM | POA: Diagnosis not present

## 2021-05-15 ENCOUNTER — Ambulatory Visit
Admission: RE | Admit: 2021-05-15 | Discharge: 2021-05-15 | Disposition: A | Discharge status: Home / Self Care | Payer: Medicare Other | Source: Ambulatory Visit | Attending: Oncology | Admitting: Oncology

## 2021-05-15 DIAGNOSIS — E1122 Type 2 diabetes mellitus with diabetic chronic kidney disease: Secondary | ICD-10-CM | POA: Diagnosis not present

## 2021-05-15 DIAGNOSIS — I129 Hypertensive chronic kidney disease with stage 1 through stage 4 chronic kidney disease, or unspecified chronic kidney disease: Secondary | ICD-10-CM | POA: Diagnosis not present

## 2021-05-15 DIAGNOSIS — H35033 Hypertensive retinopathy, bilateral: Secondary | ICD-10-CM | POA: Diagnosis not present

## 2021-05-15 DIAGNOSIS — I10 Essential (primary) hypertension: Secondary | ICD-10-CM | POA: Diagnosis not present

## 2021-05-15 DIAGNOSIS — N183 Chronic kidney disease, stage 3 unspecified: Secondary | ICD-10-CM | POA: Diagnosis not present

## 2021-05-15 DIAGNOSIS — D638 Anemia in other chronic diseases classified elsewhere: Secondary | ICD-10-CM | POA: Diagnosis not present

## 2021-05-15 DIAGNOSIS — R922 Inconclusive mammogram: Secondary | ICD-10-CM | POA: Diagnosis not present

## 2021-05-15 DIAGNOSIS — E7800 Pure hypercholesterolemia, unspecified: Secondary | ICD-10-CM | POA: Diagnosis not present

## 2021-05-15 DIAGNOSIS — Z853 Personal history of malignant neoplasm of breast: Secondary | ICD-10-CM

## 2021-05-17 ENCOUNTER — Other Ambulatory Visit: Payer: Self-pay

## 2021-05-17 DIAGNOSIS — C50011 Malignant neoplasm of nipple and areola, right female breast: Secondary | ICD-10-CM

## 2021-05-17 DIAGNOSIS — Z171 Estrogen receptor negative status [ER-]: Secondary | ICD-10-CM

## 2021-05-17 NOTE — Progress Notes (Signed)
[facility]  [phone] [phone]     ID: [name] DOB: 21-Feb-1936  MR#: [id number]  FGF#:[id number]  Patient Care Team: Tiffany Patty, MD as PCP - General (Cardiology) Tiffany Floro, MD as Consulting Physician (Family Medicine) Tiffany Miyamoto, MD as Consulting Physician (General Surgery) [name], Tiffany Hue, MD as Consulting Physician (Oncology) [name], Tiffany Buckles, MD as Consulting Physician (Cardiology) Tiffany Peak, MD as Attending Physician (Radiation Oncology) Tiffany Sells, MD (Dermatology) OTHER MD:  CHIEF COMPLAINT: Paget's disease of the right nipple, estrogen receptor negative, HER-2 amplified  CURRENT TREATMENT: observation    INTERVAL HISTORY [name] returns for follow up of Paget's disease of the right nipple. She continues under observation. She is accompanied by her daughter [name].  Since her last visit, she underwent bilateral diagnostic mammography with tomography at [facility] on 05/15/2021 showing: breast density category B; no evidence of malignancy in either breast.   REVIEW OF SYSTEMS: [name] does a lot of cooking for the holidays and just in general.  She drives.  She helps make the little pillows that we gave our patients before they are breast surgery, and all this through her church.  She participates in a Bible study and does crossword puzzles.  When asked what her worst problem is she really does not have a worse problem   COVID 19 VACCINATION STATUS: Status postvaccination x5 as of December 2020  HISTORY OF PRESENT ILLNESS: From the original intake note:  [name] is a 85 y.o. female with a new diagnosis of Paget's disease of the right nipple.   The patient reports discoloration of the right nipple present for approximately 2 years.  She eventually developed some nipple bleeding and scabbing.  She had bilateral diagnostic mammography with tomography and right breast  [facility] showing the breast density to be category B.  Mammography and ultrasonography were unremarkable, but on exam the right nipple was discolored, pink and somewhat scabbed.  There was no bleeding or discharge.  As the nipple problem persisted the patient was referred to dermatology and on 03/07/2018 Dr. Margo Olsen did a shave biopsy of the right nipple showing ([id number]) Paget's disease of the nipple, estrogen and progesterone receptor negative, but HER-2 amplified by immunohistochemistry (3+).  The patient was then referred to Dr. Magnus Olsen who set her up for repeat mammogram and Breast US done on 04/07/2018.  This showed recently diagnosed Paget's disease of the right nipple. No evidence of malignancy beyond the right nipple and areola. No evidence of metastatic adenopathy. Normal appearance of the left breast.   The patient's subsequent history is as detailed below   PAST MEDICAL HISTORY: Past Medical History:  Diagnosis Date   Anemia    takes iron   Arthritis    Dr [name]   Carotid artery stenosis    1-39% bilateral dopplers 08/2015   Cataracts, bilateral    Chronic kidney disease    stage III Dr [name]   Diabetes mellitus    Family history of breast cancer    Family history of breast cancer    Family history of melanoma    GERD (gastroesophageal reflux disease)    Heart murmur    Dr [name]   HTN (hypertension)    Hyperlipidemia    Morbid obesity with BMI of 40.0-44.9, adult (HCC)    Obstructive sleep apnea (adult) (pediatric)    w CPAP Severe OSA AHI 52/hr CPAP at 10cm H2O  Pulmonary HTN (HCC)    mild with PASP 65mmHg echo 02/2014   Right carotid bruit 08/19/2014   Shortness of breath    resolved with CPAP   Urination frequency    Vitamin D deficiency disease    Wears dentures    upper   Wears glasses     PAST SURGICAL HISTORY: Past Surgical History:  Procedure Laterality Date   ABDOMINAL HYSTERECTOMY     partial    APPENDECTOMY     CARDIOVASCULAR STRESS TEST  2011   CHOLECYSTECTOMY N/A 10/04/2015   Procedure: LAPAROSCOPIC CHOLECYSTECTOMY;  Surgeon: Tiffany Keens, MD;  Location: [facility];  Service: General;  Laterality: N/A;   COLONOSCOPY  01/15/2012   Procedure: COLONOSCOPY;  Surgeon: Tiffany Ng, MD;  Location: [facility];  Service: Endoscopy;  Laterality: N/A;   ESOPHAGOGASTRODUODENOSCOPY  01/15/2012   Procedure: ESOPHAGOGASTRODUODENOSCOPY (EGD);  Surgeon: Tiffany Ng, MD;  Location: [facility];  Service: Endoscopy;  Laterality: N/A;   JOINT REPLACEMENT Bilateral    Knees   left shoulder surgery      rotator cuff tear    MASTECTOMY, PARTIAL Right 06/11/2018   Procedure: RIGHT BREAST PARTIAL MASTECTOMY;  Surgeon: Tiffany Keens, MD;  Location: [facility];  Service: General;  Laterality: Right;   sleep study  2012   TOTAL KNEE ARTHROPLASTY     Left   TOTAL KNEE ARTHROPLASTY  07/02/2011   Procedure: TOTAL KNEE ARTHROPLASTY;  Surgeon: Tiffany Haskell, MD;  Location: [place];  Service: Orthopedics;  Laterality: Right;   US ECHOCARDIOGRAPHY     2012    FAMILY HISTORY Family History  Problem Relation Age of Onset   Diabetes Mother    Diabetes Father    Breast cancer Maternal Aunt        unkown age of diagnosis   Melanoma Cousin   Her father was diabetic, and died after a fall in his early 45s. Her mother was also a diabetic, died in her 5s due to diabetic complications. The patient has one full brother and 46 half brothers. She has 2 full sisters, one passed away from tonic obstructive pulmonary disease at age 38.  The patient also had 2 half-sisters, one half sister died from a heart disease.  3 out of 6 maternal aunts had breast cancer. She denies a family history of ovarian cancer.   GYNECOLOGIC HISTORY:  Menarche: 49 YO First child: age 57 GXP2  Hysterectomy in her 1s. She took hormone replacement for almost 30 years. BSO:no   SOCIAL HISTORY:  She is a retired Regulatory affairs officer.She  is widowed. She lives with her daughter [name] and 2 granddaughters, aged 59 (a [employer] teaching in pregnancy centers), and 25 (completed college but not currently working). Her daughter [name] works at [employer] Her son [name] lives in [place] and has 2 children.  The patient has 2 great-grandchildren.  She is a member of a local [employer]: At the 04/11/2020 visit the patient was given the appropriate documents to complete and notarize at her discretion.  She intends to name her daughter Velna Hatchet as her healthcare power of attorney with her son Lanny Hurst second   HEALTH MAINTENANCE: Social History   Tobacco Use   Smoking status: Never   Smokeless tobacco: Never  Vaping Use   Vaping Use: Never used  Substance Use Topics   Alcohol use: No   Drug use: No     Colonoscopy: at [facility]  PAP:  Bone density: Never  Lipid panel:   Allergies  Allergen Reactions   Codeine Itching   Ibuprofen Other (See Comments)    "gave her kidney trouble"   Current Outpatient Medications  Medication Sig Dispense Refill   acetaminophen (TYLENOL) 500 MG tablet Take 500 mg by mouth every 8 (eight) hours as needed for mild pain, moderate pain, fever or headache.      amLODipine (NORVASC) 10 MG tablet Take 10 mg by mouth daily.  6   amoxicillin (AMOXIL) 875 MG tablet Take 875 mg by mouth 2 (two) times daily.     aspirin EC 81 MG tablet Take 81 mg by mouth daily.     atorvastatin (LIPITOR) 40 MG tablet Take 40 mg by mouth at bedtime.     Cholecalciferol (VITAMIN D) 2000 UNITS CAPS Take 2,000 Units by mouth daily.     docusate sodium (COLACE) 100 MG capsule Take 100 mg by mouth daily as needed for mild constipation.     Esomeprazole Magnesium 20 MG TBEC Take 20 mg by mouth daily.      ezetimibe (ZETIA) 10 MG tablet Take 10 mg by mouth at bedtime.     furosemide (LASIX) 40 MG tablet TAKE 1/2 TABLET BY MOUTH DAILY 45 tablet 1   LORazepam (ATIVAN) 0.5 MG tablet Take 0.5  mg by mouth 2 (two) times daily. As needed     Multiple Vitamins-Minerals (MULTIVITAMINS THER. W/MINERALS) TABS Take 1 tablet by mouth daily.     pioglitazone (ACTOS) 30 MG tablet Take 30 mg by mouth daily.     spironolactone (ALDACTONE) 25 MG tablet TAKE 1 TABLET BY MOUTH  EVERY OTHER DAY 45 tablet 6   No current facility-administered medications for this visit.    OBJECTIVE: African-American woman in no acute distress     Body mass index is 34.64 kg/m.    ECOG FS:1 - Symptomatic but completely ambulatory Vitals:   05/18/21 1052  BP: (!) 146/58  Pulse: (!) 51  Resp: 18  Temp: 98.3 F (36.8 C)  TempSrc: Oral  SpO2: 100%  Weight: 183 lb 5 oz (83.2 kg)  Height: $Remove'5\' 1"'vrwSrOK$  (1.549 m)    Sclerae unicteric, EOMs intact Wearing a mask No cervical or supraclavicular adenopathy Lungs no rales or rhonchi Heart regular rate and rhythm Abd soft, nontender, positive bowel sounds MSK no focal spinal tenderness, no upper extremity lymphedema Neuro: nonfocal, well oriented, appropriate affect Breasts: The right breast is status post central lumpectomy.  There is no evidence of disease recurrence.  The left breast and both axillae are benign.   LAB RESULTS:  CMP     Component Value Date/Time   NA 135 12/19/2020 1039   K 4.2 12/19/2020 1039   CL 97 (L) 12/19/2020 1039   CO2 26 12/19/2020 1039   GLUCOSE 113 (H) 12/19/2020 1039   BUN 40 (H) 12/19/2020 1039   CREATININE 1.75 (H) 12/19/2020 1039   CREATININE 1.33 (H) 04/08/2018 1549   CALCIUM 9.6 12/19/2020 1039   PROT 8.0 11/26/2020 1741   ALBUMIN 4.1 11/26/2020 1741   AST 44 (H) 11/26/2020 1741   AST 18 04/08/2018 1549   ALT 20 11/26/2020 1741   ALT 12 04/08/2018 1549   ALKPHOS 73 11/26/2020 1741   BILITOT 0.8 11/26/2020 1741   BILITOT 0.5 04/08/2018 1549   GFRNONAA 29 (L) 12/19/2020 1039   GFRNONAA 37 (L) 04/08/2018 1549   GFRAA 48 (L) 07/14/2018 1500   GFRAA 43 (L) 04/08/2018 1549    No results found for: KPAFRELGTCHN,  LAMBDASER, KAPLAMBRATIO  No results found for: TOTALPROTELP, ALBUMINELP, A1GS, A2GS, BETS, BETA2SER, GAMS, MSPIKE, SPEI  Lab Results  Component Value Date   WBC 5.9 11/26/2020   NEUTROABS 2.6 07/14/2018   HGB 11.0 (L) 11/26/2020   HCT 33.8 (L) 11/26/2020   MCV 88.9 11/26/2020   PLT 242 11/26/2020   No results found for: LABCA2  No components found for: [id number]  No results for input(s): INR in the last 168 hours.  Urinalysis    Component Value Date/Time   COLORURINE YELLOW 09/14/2015 1344   APPEARANCEUR CLEAR 09/14/2015 1344   LABSPEC 1.009 09/14/2015 1344   PHURINE 6.0 09/14/2015 1344   GLUCOSEU NEGATIVE 09/14/2015 1344   HGBUR TRACE (A) 09/14/2015 1344   BILIRUBINUR NEGATIVE 09/14/2015 1344   KETONESUR NEGATIVE 09/14/2015 1344   PROTEINUR NEGATIVE 09/14/2015 1344   UROBILINOGEN 0.2 06/21/2011 1204   NITRITE NEGATIVE 09/14/2015 1344   LEUKOCYTESUR NEGATIVE 09/14/2015 1344    RADIOLOGY AND OTHER STUDIES: MM DIAG BREAST TOMO BILATERAL  Result Date: 05/15/2021 CLINICAL DATA:  85 year old female status post partial right mastectomy in January 2020 for Paget's disease. EXAM: DIGITAL DIAGNOSTIC BILATERAL MAMMOGRAM WITH TOMOSYNTHESIS AND CAD TECHNIQUE: Bilateral digital diagnostic mammography and breast tomosynthesis was performed. The images were evaluated with computer-aided detection. COMPARISON:  Previous exam(s). ACR Breast Density Category b: There are scattered areas of fibroglandular density. FINDINGS: No focal or suspicious mammographic findings are identified in either breast. There are stable posttreatment changes of the right breast. The parenchymal pattern is stable. IMPRESSION: 1. No mammographic evidence of malignancy in either breast. 2. Stable right breast posttreatment changes. RECOMMENDATION: Screening mammogram in one year as clinically indicated.(Code:SM-B-01Y) I have discussed the findings and recommendations with the patient. If applicable, a reminder letter  will be sent to the patient regarding the next appointment. BI-RADS CATEGORY  2: Benign. Electronically Signed   By: Kristopher Oppenheim M.D.   On: 05/15/2021 08:51     ASSESSMENT: 85 y.o. [place] woman status post right nipple biopsy 03/07/2018, showing Paget's disease of the breast, estrogen and progesterone receptor negative, HER-2 amplified  (1) genetics testing 05/05/2018 through the [facility] STAT + Common Hereditary Cancers Panel showed no deleterious mutations in ATM, BRCA1, BRCA2, CDH1, CHEK2, PALB2, PTEN, STK11 and TP53.  The Common Hereditary Cancers Panel offered by [facility] includes sequencing and/or deletion duplication testing of the following 47 genes: APC, ATM, AXIN2, BARD1, BMPR1A, BRCA1, BRCA2, BRIP1, CDH1, CDKN2A (p14ARF), CDKN2A (p16INK4a), CKD4, CHEK2, CTNNA1, DICER1, EPCAM (Deletion/duplication testing only), GREM1 (promoter region deletion/duplication testing only), KIT, MEN1, MLH1, MSH2, MSH3, MSH6, MUTYH, NBN, NF1, NHTL1, PALB2, PDGFRA, PMS2, POLD1, POLE, PTEN, RAD50, RAD51C, RAD51D, SDHB, SDHC, SDHD, SMAD4, SMARCA4. STK11, TP53, TSC1, TSC2, and VHL.  The following genes were evaluated for sequence changes only: SDHA and HOXB13 c.251G>A variant only.  (2) status post partial central mastectomy 06/11/2018 showing ductal carcinoma in situ, grade 2 measuring 1.5 cm, with negative margins.  (3) no radiation or anti-HER-2 treatment planned   PLAN: [name] is coming up on 2 years from definitive surgery for her breast cancer with no evidence of disease recurrence.  This is very favorable.  We discussed her mammogram which shows her breasts are not dense and she understands that is favorable.  She will see [place] again in 1 year, after her mammogram next December  Total encounter time 20 minutes.Chauncey Cruel, MD   05/18/2021 11:04 AM Medical Oncology and Hematology [facility]  [place], [place] [address] Tel. [phone]    Fax.  [phone]   I, Wilburn Mylar, am acting as scribe for Dr. Virgie Dad. Ursala Cressy.  I, Lurline Del MD, have reviewed the above documentation for accuracy and completeness, and I agree with the above.   *Total Encounter Time as defined by the Centers for Medicare and Medicaid Services includes, in addition to the face-to-face time of a patient visit (documented in the note above) non-face-to-face time: obtaining and reviewing outside history, ordering and reviewing medications, tests or procedures, care coordination (communications with other health care professionals or caregivers) and documentation in the medical record.

## 2021-05-18 ENCOUNTER — Inpatient Hospital Stay: Payer: Medicare Other | Attending: Oncology | Admitting: Oncology

## 2021-05-18 ENCOUNTER — Inpatient Hospital Stay: Payer: Medicare Other

## 2021-05-18 ENCOUNTER — Other Ambulatory Visit: Payer: Self-pay

## 2021-05-18 VITALS — BP 146/58 | HR 51 | Temp 98.3°F | Resp 18 | Ht 61.0 in | Wt 183.3 lb

## 2021-05-18 DIAGNOSIS — C50011 Malignant neoplasm of nipple and areola, right female breast: Secondary | ICD-10-CM

## 2021-05-18 DIAGNOSIS — Z86000 Personal history of in-situ neoplasm of breast: Secondary | ICD-10-CM | POA: Insufficient documentation

## 2021-05-18 DIAGNOSIS — Z9011 Acquired absence of right breast and nipple: Secondary | ICD-10-CM | POA: Diagnosis not present

## 2021-05-18 DIAGNOSIS — Z171 Estrogen receptor negative status [ER-]: Secondary | ICD-10-CM

## 2021-05-19 ENCOUNTER — Telehealth: Payer: Self-pay | Admitting: Oncology

## 2021-05-19 NOTE — Telephone Encounter (Signed)
Scheduled appointment per 12/15 los. Patient is aware. 

## 2021-05-19 NOTE — Telephone Encounter (Signed)
Patient will be mailed updated calendar.  °

## 2021-05-29 DIAGNOSIS — G4733 Obstructive sleep apnea (adult) (pediatric): Secondary | ICD-10-CM | POA: Diagnosis not present

## 2021-06-01 ENCOUNTER — Telehealth: Payer: Self-pay | Admitting: Oncology

## 2021-06-01 NOTE — Telephone Encounter (Signed)
Patient will be mailed updated calendar.  °

## 2021-06-07 DIAGNOSIS — G4733 Obstructive sleep apnea (adult) (pediatric): Secondary | ICD-10-CM | POA: Diagnosis not present

## 2021-07-12 DIAGNOSIS — I129 Hypertensive chronic kidney disease with stage 1 through stage 4 chronic kidney disease, or unspecified chronic kidney disease: Secondary | ICD-10-CM | POA: Diagnosis not present

## 2021-07-12 DIAGNOSIS — N183 Chronic kidney disease, stage 3 unspecified: Secondary | ICD-10-CM | POA: Diagnosis not present

## 2021-07-12 DIAGNOSIS — E7800 Pure hypercholesterolemia, unspecified: Secondary | ICD-10-CM | POA: Diagnosis not present

## 2021-07-12 DIAGNOSIS — E1122 Type 2 diabetes mellitus with diabetic chronic kidney disease: Secondary | ICD-10-CM | POA: Diagnosis not present

## 2021-08-08 DIAGNOSIS — E7800 Pure hypercholesterolemia, unspecified: Secondary | ICD-10-CM | POA: Diagnosis not present

## 2021-08-08 DIAGNOSIS — E1122 Type 2 diabetes mellitus with diabetic chronic kidney disease: Secondary | ICD-10-CM | POA: Diagnosis not present

## 2021-08-08 DIAGNOSIS — K219 Gastro-esophageal reflux disease without esophagitis: Secondary | ICD-10-CM | POA: Diagnosis not present

## 2021-08-08 DIAGNOSIS — Z7984 Long term (current) use of oral hypoglycemic drugs: Secondary | ICD-10-CM | POA: Diagnosis not present

## 2021-08-08 DIAGNOSIS — N183 Chronic kidney disease, stage 3 unspecified: Secondary | ICD-10-CM | POA: Diagnosis not present

## 2021-08-08 DIAGNOSIS — R42 Dizziness and giddiness: Secondary | ICD-10-CM | POA: Diagnosis not present

## 2021-08-08 DIAGNOSIS — Z7409 Other reduced mobility: Secondary | ICD-10-CM | POA: Diagnosis not present

## 2021-08-08 DIAGNOSIS — K588 Other irritable bowel syndrome: Secondary | ICD-10-CM | POA: Diagnosis not present

## 2021-08-16 ENCOUNTER — Other Ambulatory Visit (HOSPITAL_COMMUNITY): Payer: Self-pay | Admitting: Internal Medicine

## 2021-08-21 DIAGNOSIS — N390 Urinary tract infection, site not specified: Secondary | ICD-10-CM | POA: Diagnosis not present

## 2021-08-21 DIAGNOSIS — N1832 Chronic kidney disease, stage 3b: Secondary | ICD-10-CM | POA: Diagnosis not present

## 2021-08-24 ENCOUNTER — Telehealth: Payer: Self-pay

## 2021-08-24 ENCOUNTER — Telehealth (INDEPENDENT_AMBULATORY_CARE_PROVIDER_SITE_OTHER): Payer: Medicare Other | Admitting: Cardiology

## 2021-08-24 ENCOUNTER — Encounter: Payer: Self-pay | Admitting: Cardiology

## 2021-08-24 ENCOUNTER — Other Ambulatory Visit: Payer: Self-pay

## 2021-08-24 VITALS — Ht 61.0 in | Wt 181.0 lb

## 2021-08-24 DIAGNOSIS — I10 Essential (primary) hypertension: Secondary | ICD-10-CM

## 2021-08-24 DIAGNOSIS — G4733 Obstructive sleep apnea (adult) (pediatric): Secondary | ICD-10-CM | POA: Diagnosis not present

## 2021-08-24 NOTE — Patient Instructions (Signed)

## 2021-08-24 NOTE — Telephone Encounter (Signed)
?  Patient Consent for Virtual Visit  ? ?  ?      ? ?[name] has provided verbal consent on 08/24/2021 for a virtual visit (video or telephone). ? ? ?CONSENT FOR VIRTUAL VISIT FOR:  [name]  ?By participating in this virtual visit I agree to the following: ? ?I hereby voluntarily request, consent and authorize [facility] and its employed or contracted physicians, physician assistants, nurse practitioners or other licensed health care professionals (the Practitioner), to provide me with telemedicine health care services (the ?Services") as deemed necessary by the treating Practitioner. I acknowledge and consent to receive the Services by the Practitioner via telemedicine. I understand that the telemedicine visit will involve communicating with the Practitioner through live audiovisual communication technology and the disclosure of certain medical information by electronic transmission. I acknowledge that I have been given the opportunity to request an in-person assessment or other available alternative prior to the telemedicine visit and am voluntarily participating in the telemedicine visit. ? ?I understand that I have the right to withhold or withdraw my consent to the use of telemedicine in the course of my care at any time, without affecting my right to future care or treatment, and that the Practitioner or I may terminate the telemedicine visit at any time. I understand that I have the right to inspect all information obtained and/or recorded in the course of the telemedicine visit and may receive copies of available information for a reasonable fee.  I understand that some of the potential risks of receiving the Services via telemedicine include:  ?Delay or interruption in medical evaluation due to technological equipment failure or disruption; ?Information transmitted may not be sufficient (e.g. poor resolution of images) to allow for appropriate medical decision making by the Practitioner;  and/or  ?In rare instances, security protocols could fail, causing a breach of personal health information. ? ?Furthermore, I acknowledge that it is my responsibility to provide information about my medical history, conditions and care that is complete and accurate to the best of my ability. I acknowledge that Practitioner's advice, recommendations, and/or decision may be based on factors not within their control, such as incomplete or inaccurate data provided by me or distortions of diagnostic images or specimens that may result from electronic transmissions. I understand that the practice of medicine is not an exact science and that Practitioner makes no warranties or guarantees regarding treatment outcomes. I acknowledge that a copy of this consent can be made available to me via my patient portal ([facility]), or I can request a printed copy by calling the office of [facility].   ? ?I understand that my insurance will be billed for this visit.  ? ?I have read or had this consent read to me. ?I understand the contents of this consent, which adequately explains the benefits and risks of the Services being provided via telemedicine.  ?I have been provided ample opportunity to ask questions regarding this consent and the Services and have had my questions answered to my satisfaction. ?I give my informed consent for the services to be provided through the use of telemedicine in my medical care ? ? ? ?

## 2021-08-24 NOTE — Progress Notes (Signed)
? ?Virtual Visit via Video   ? ?This visit type was conducted due to national recommendations for restrictions regarding the COVID-19 Pandemic (e.g. social distancing) in an effort to limit this patient's exposure and mitigate transmission in our community.  Due to her co-morbid illnesses, this patient is at least at moderate risk for complications without adequate follow up.  This format is felt to be most appropriate for this patient at this time.  Please refer to the patient's chart for her  consent to telehealth for [facility].  ? ?Evaluation Performed:  Follow-up visit ?Date:  08/24/2021  ? ?ID:  [name], DOB [birth year], MRN [id number] ? ?Patient Location:  ?Home ? ?Provider location:   ?[facility] ? ?PCP:  Kathyrn Lass, MD  ?Sleep medicine:  Fransico Him, MD ?Electrophysiologist:  None  ? ?Chief Complaint:  OSA, HTN ? ?History of Present Illness:   ? ?[name] is a 86 y.o. female who presents via audio/video conferencing for a telehealth visit today.   ? ?[name] is a 86 y.o. female with a hx of  OSA, obesity, moderate pulmonary HTN, diastolic dysfunction and HTN. She is doing well with her CPAP device and thinks that she has gotten used to it.  She tolerates the mask and feels the pressure is adequate.  Since going on CPAP she feels rested in the am and has no significant daytime sleepiness.  She denies any significant mouth or nasal dryness or nasal congestion.  She does not think that he snores.    ? ?The patient does not have symptoms concerning for COVID-19 infection (fever, chills, cough, or new shortness of breath).  ? ?Prior CV studies:   ?The following studies were reviewed today: ? ?PAP compliance download from [place] [facility] and DME ? ?Past Medical History:  ?Diagnosis Date  ? Anemia   ? takes iron  ? Arthritis   ? Dr Ronnie Derby  ? Carotid artery stenosis   ? 1-39% bilateral dopplers 08/2015  ? Cataracts, bilateral   ? Chronic kidney disease   ? stage III Dr Mercy Moore  ? Diabetes  mellitus   ? Family history of breast cancer   ? Family history of breast cancer   ? Family history of melanoma   ? GERD (gastroesophageal reflux disease)   ? Heart murmur   ? Dr Irish Lack  ? HTN (hypertension)   ? Hyperlipidemia   ? Morbid obesity with BMI of 40.0-44.9, adult ([place])   ? Obstructive sleep apnea (adult) (pediatric)   ? w CPAP Severe OSA AHI 52/hr CPAP at 10cm H2O  ? Pulmonary HTN ([place])   ? mild with PASP 33mHg echo 02/2014  ? Right carotid bruit 08/19/2014  ? Shortness of breath   ? resolved with CPAP  ? Urination frequency   ? Vitamin D deficiency disease   ? Wears dentures   ? upper  ? Wears glasses   ? ?Past Surgical History:  ?Procedure Laterality Date  ? ABDOMINAL HYSTERECTOMY    ? partial  ? APPENDECTOMY    ? CARDIOVASCULAR STRESS TEST  2011  ? CHOLECYSTECTOMY N/A 10/04/2015  ? Procedure: LAPAROSCOPIC CHOLECYSTECTOMY;  Surgeon: DCoralie Keens MD;  [facility];  Service: General;  Laterality: N/A;  ? COLONOSCOPY  01/15/2012  ? Procedure: COLONOSCOPY;  Surgeon: VLear Ng MD;  [facility];  Service: Endoscopy;  Laterality: N/A;  ? ESOPHAGOGASTRODUODENOSCOPY  01/15/2012  ? Procedure: ESOPHAGOGASTRODUODENOSCOPY (EGD);  Surgeon: VLear Ng MD;  [facility];  Service:  Endoscopy;  Laterality: N/A;  ? JOINT REPLACEMENT Bilateral   ? Knees  ? left shoulder surgery     ? rotator cuff tear   ? MASTECTOMY, PARTIAL Right 06/11/2018  ? Procedure: RIGHT BREAST PARTIAL MASTECTOMY;  Surgeon: Coralie Keens, MD;  Location: [facility];  Service: General;  Laterality: Right;  ? sleep study  2012  ? TOTAL KNEE ARTHROPLASTY    ? Left  ? TOTAL KNEE ARTHROPLASTY  07/02/2011  ? Procedure: TOTAL KNEE ARTHROPLASTY;  Surgeon: Rudean Haskell, MD;  Location: [facility];  Service: Orthopedics;  Laterality: Right;  ? US ECHOCARDIOGRAPHY    ? 2012  ?  ? ?Current Meds  ?Medication Sig  ? acetaminophen (TYLENOL) 500 MG tablet Take 500 mg by mouth every 8 (eight) hours as needed for mild pain,  moderate pain, fever or headache.   ? amLODipine (NORVASC) 10 MG tablet Take 10 mg by mouth daily.  ? aspirin EC 81 MG tablet Take 81 mg by mouth daily.  ? atorvastatin (LIPITOR) 40 MG tablet Take 40 mg by mouth at bedtime.  ? Cholecalciferol (VITAMIN D) 2000 UNITS CAPS Take 2,000 Units by mouth daily.  ? docusate sodium (COLACE) 100 MG capsule Take 100 mg by mouth daily as needed for mild constipation.  ? Esomeprazole Magnesium 20 MG TBEC Take 20 mg by mouth daily.   ? ezetimibe (ZETIA) 10 MG tablet Take 10 mg by mouth at bedtime.  ? FARXIGA 10 MG TABS tablet Take 10 mg by mouth daily.  ? furosemide (LASIX) 40 MG tablet TAKE 1/2 TABLET BY MOUTH DAILY  ? LORazepam (ATIVAN) 0.5 MG tablet Take 0.5 mg by mouth 2 (two) times daily. As needed  ? Multiple Vitamins-Minerals (MULTIVITAMINS THER. W/MINERALS) TABS Take 1 tablet by mouth daily.  ? spironolactone (ALDACTONE) 25 MG tablet TAKE 1 TABLET BY MOUTH  EVERY OTHER DAY  ?  ? ?Allergies:   Codeine and Ibuprofen  ? ?Social History  ? ?Tobacco Use  ? Smoking status: Never  ? Smokeless tobacco: Never  ?Vaping Use  ? Vaping Use: Never used  ?Substance Use Topics  ? Alcohol use: No  ? Drug use: No  ?  ? ?Family Hx: ?The patient's family history includes Breast cancer in her maternal aunt; Diabetes in her father and mother; Melanoma in her cousin. ? ?ROS:   ?Please see the history of present illness.    ? ?All other systems reviewed and are negative. ? ? ?Labs/Other Tests and Data Reviewed:   ? ?Recent Labs: ?11/26/2020: ALT 20; Hemoglobin 11.0; Platelets 242 ?12/19/2020: BUN 40; Creatinine, Ser 1.75; Magnesium 2.1; Potassium 4.2; Sodium 135  ? ?Recent Lipid Panel ?No results found for: CHOL, TRIG, HDL, CHOLHDL, LDLCALC, LDLDIRECT ? ?Wt Readings from Last 3 Encounters:  ?08/24/21 181 lb (82.1 kg)  ?05/18/21 183 lb 5 oz (83.2 kg)  ?03/21/21 194 lb 12.8 oz (88.4 kg)  ?  ? ?Objective:   ? ?Vital Signs:  Ht '5\' 1"'$  (1.549 m)   Wt 181 lb (82.1 kg)   BMI 34.20 kg/m?   ?Well nourished,  well developed female in no acute distress. ?Well appearing, alert and conversant, regular work of breathing,  good skin color  ?Eyes- anicteric ?mouth- oral mucosa is pink  ?neuro- grossly intact ?skin- no apparent rash or lesions or cyanosis  ? ?ASSESSMENT & PLAN:   ? ?1.  OSA - The patient is tolerating PAP therapy well without any problems. The PAP download performed by his DME was personally  reviewed and interpreted by me today and showed an AHI of 0.4 /hr on 10 cm H2O with 83% compliance in using more than 4 hours nightly.  The patient has been using and benefiting from PAP use and will continue to benefit from therapy.  ?  ?2.  HTN ?-Her BP is controlled at home ?-Continue prescription drug therapy with amlodipine 10 mg daily and spironolactone 25 mg daily with as needed refills ? ?COVID-19 Education: ?The signs and symptoms of COVID-19 were discussed with the patient and how to seek care for testing (follow up with PCP or arrange E-visit).  The importance of social distancing was discussed today. ? ?Patient Risk:   ?After full review of this patient's clinical status, I feel that they are at least moderate risk at this time. ? ?Time:   ?Today, I have spent 15 minutes on telemedicine discussing medical problems including OSA,HTN, Obesity and reviewing patient's chart including PAP compliance download from DME on [facility]. ? ?Medication Adjustments/Labs and Tests Ordered: ?Current medicines are reviewed at length with the patient today.  Concerns regarding medicines are outlined above.  ?Tests Ordered: ?No orders of the defined types were placed in this encounter. ? ?Medication Changes: ?No orders of the defined types were placed in this encounter. ? ? ?Disposition:  Follow up in 1 year(s) ? ?Signed, ?Fransico Him, MD  ?08/24/2021 8:25 AM    ?[place] ?

## 2021-08-28 DIAGNOSIS — G4733 Obstructive sleep apnea (adult) (pediatric): Secondary | ICD-10-CM | POA: Diagnosis not present

## 2021-08-29 DIAGNOSIS — E1122 Type 2 diabetes mellitus with diabetic chronic kidney disease: Secondary | ICD-10-CM | POA: Diagnosis not present

## 2021-08-29 DIAGNOSIS — N1832 Chronic kidney disease, stage 3b: Secondary | ICD-10-CM | POA: Diagnosis not present

## 2021-08-29 DIAGNOSIS — I129 Hypertensive chronic kidney disease with stage 1 through stage 4 chronic kidney disease, or unspecified chronic kidney disease: Secondary | ICD-10-CM | POA: Diagnosis not present

## 2021-08-29 DIAGNOSIS — N390 Urinary tract infection, site not specified: Secondary | ICD-10-CM | POA: Diagnosis not present

## 2021-08-29 DIAGNOSIS — D631 Anemia in chronic kidney disease: Secondary | ICD-10-CM | POA: Diagnosis not present

## 2021-08-29 DIAGNOSIS — N2581 Secondary hyperparathyroidism of renal origin: Secondary | ICD-10-CM | POA: Diagnosis not present

## 2021-09-05 DIAGNOSIS — G4733 Obstructive sleep apnea (adult) (pediatric): Secondary | ICD-10-CM | POA: Diagnosis not present

## 2021-11-09 DIAGNOSIS — I129 Hypertensive chronic kidney disease with stage 1 through stage 4 chronic kidney disease, or unspecified chronic kidney disease: Secondary | ICD-10-CM | POA: Diagnosis not present

## 2021-11-09 DIAGNOSIS — E1122 Type 2 diabetes mellitus with diabetic chronic kidney disease: Secondary | ICD-10-CM | POA: Diagnosis not present

## 2021-11-09 DIAGNOSIS — N183 Chronic kidney disease, stage 3 unspecified: Secondary | ICD-10-CM | POA: Diagnosis not present

## 2021-11-09 DIAGNOSIS — I272 Pulmonary hypertension, unspecified: Secondary | ICD-10-CM | POA: Diagnosis not present

## 2021-11-09 DIAGNOSIS — E785 Hyperlipidemia, unspecified: Secondary | ICD-10-CM | POA: Diagnosis not present

## 2021-11-13 DIAGNOSIS — H35033 Hypertensive retinopathy, bilateral: Secondary | ICD-10-CM | POA: Diagnosis not present

## 2021-11-13 DIAGNOSIS — E119 Type 2 diabetes mellitus without complications: Secondary | ICD-10-CM | POA: Diagnosis not present

## 2021-11-13 DIAGNOSIS — H2513 Age-related nuclear cataract, bilateral: Secondary | ICD-10-CM | POA: Diagnosis not present

## 2021-11-13 DIAGNOSIS — H04123 Dry eye syndrome of bilateral lacrimal glands: Secondary | ICD-10-CM | POA: Diagnosis not present

## 2021-11-14 DIAGNOSIS — R0789 Other chest pain: Secondary | ICD-10-CM | POA: Diagnosis not present

## 2021-11-14 DIAGNOSIS — Z23 Encounter for immunization: Secondary | ICD-10-CM | POA: Diagnosis not present

## 2021-11-30 DIAGNOSIS — M9903 Segmental and somatic dysfunction of lumbar region: Secondary | ICD-10-CM | POA: Diagnosis not present

## 2021-11-30 DIAGNOSIS — M9904 Segmental and somatic dysfunction of sacral region: Secondary | ICD-10-CM | POA: Diagnosis not present

## 2021-11-30 DIAGNOSIS — M9905 Segmental and somatic dysfunction of pelvic region: Secondary | ICD-10-CM | POA: Diagnosis not present

## 2021-11-30 DIAGNOSIS — M5136 Other intervertebral disc degeneration, lumbar region: Secondary | ICD-10-CM | POA: Diagnosis not present

## 2021-12-04 DIAGNOSIS — M9904 Segmental and somatic dysfunction of sacral region: Secondary | ICD-10-CM | POA: Diagnosis not present

## 2021-12-04 DIAGNOSIS — M9903 Segmental and somatic dysfunction of lumbar region: Secondary | ICD-10-CM | POA: Diagnosis not present

## 2021-12-04 DIAGNOSIS — M9905 Segmental and somatic dysfunction of pelvic region: Secondary | ICD-10-CM | POA: Diagnosis not present

## 2021-12-04 DIAGNOSIS — M5136 Other intervertebral disc degeneration, lumbar region: Secondary | ICD-10-CM | POA: Diagnosis not present

## 2021-12-07 DIAGNOSIS — M9903 Segmental and somatic dysfunction of lumbar region: Secondary | ICD-10-CM | POA: Diagnosis not present

## 2021-12-07 DIAGNOSIS — M9905 Segmental and somatic dysfunction of pelvic region: Secondary | ICD-10-CM | POA: Diagnosis not present

## 2021-12-07 DIAGNOSIS — M5136 Other intervertebral disc degeneration, lumbar region: Secondary | ICD-10-CM | POA: Diagnosis not present

## 2021-12-07 DIAGNOSIS — M9904 Segmental and somatic dysfunction of sacral region: Secondary | ICD-10-CM | POA: Diagnosis not present

## 2021-12-11 DIAGNOSIS — M9905 Segmental and somatic dysfunction of pelvic region: Secondary | ICD-10-CM | POA: Diagnosis not present

## 2021-12-11 DIAGNOSIS — M5136 Other intervertebral disc degeneration, lumbar region: Secondary | ICD-10-CM | POA: Diagnosis not present

## 2021-12-11 DIAGNOSIS — M9904 Segmental and somatic dysfunction of sacral region: Secondary | ICD-10-CM | POA: Diagnosis not present

## 2021-12-11 DIAGNOSIS — M9903 Segmental and somatic dysfunction of lumbar region: Secondary | ICD-10-CM | POA: Diagnosis not present

## 2021-12-14 DIAGNOSIS — M9903 Segmental and somatic dysfunction of lumbar region: Secondary | ICD-10-CM | POA: Diagnosis not present

## 2021-12-14 DIAGNOSIS — M5136 Other intervertebral disc degeneration, lumbar region: Secondary | ICD-10-CM | POA: Diagnosis not present

## 2021-12-14 DIAGNOSIS — M9904 Segmental and somatic dysfunction of sacral region: Secondary | ICD-10-CM | POA: Diagnosis not present

## 2021-12-14 DIAGNOSIS — M9905 Segmental and somatic dysfunction of pelvic region: Secondary | ICD-10-CM | POA: Diagnosis not present

## 2021-12-19 ENCOUNTER — Other Ambulatory Visit (HOSPITAL_COMMUNITY): Payer: Self-pay | Admitting: Internal Medicine

## 2022-01-04 DIAGNOSIS — G4733 Obstructive sleep apnea (adult) (pediatric): Secondary | ICD-10-CM | POA: Diagnosis not present

## 2022-01-15 DIAGNOSIS — M7581 Other shoulder lesions, right shoulder: Secondary | ICD-10-CM | POA: Diagnosis not present

## 2022-01-15 DIAGNOSIS — M5136 Other intervertebral disc degeneration, lumbar region: Secondary | ICD-10-CM | POA: Diagnosis not present

## 2022-01-15 DIAGNOSIS — M9903 Segmental and somatic dysfunction of lumbar region: Secondary | ICD-10-CM | POA: Diagnosis not present

## 2022-01-15 DIAGNOSIS — M9905 Segmental and somatic dysfunction of pelvic region: Secondary | ICD-10-CM | POA: Diagnosis not present

## 2022-01-15 DIAGNOSIS — M9904 Segmental and somatic dysfunction of sacral region: Secondary | ICD-10-CM | POA: Diagnosis not present

## 2022-01-19 ENCOUNTER — Other Ambulatory Visit (HOSPITAL_COMMUNITY): Payer: Self-pay | Admitting: Internal Medicine

## 2022-01-31 DIAGNOSIS — M9901 Segmental and somatic dysfunction of cervical region: Secondary | ICD-10-CM | POA: Diagnosis not present

## 2022-01-31 DIAGNOSIS — M5136 Other intervertebral disc degeneration, lumbar region: Secondary | ICD-10-CM | POA: Diagnosis not present

## 2022-01-31 DIAGNOSIS — M9903 Segmental and somatic dysfunction of lumbar region: Secondary | ICD-10-CM | POA: Diagnosis not present

## 2022-01-31 DIAGNOSIS — M50322 Other cervical disc degeneration at C5-C6 level: Secondary | ICD-10-CM | POA: Diagnosis not present

## 2022-02-02 ENCOUNTER — Ambulatory Visit: Payer: Self-pay

## 2022-02-02 NOTE — Patient Outreach (Signed)
  Care Coordination   02/02/2022 Name: [name] MRN: [id number] DOB: 08-Oct-1935   Care Coordination Outreach Attempts:  An unsuccessful telephone outreach was attempted today to offer the patient information about available care coordination services as a benefit of their health plan.   Follow Up Plan:  Additional outreach attempts will be made to offer the patient care coordination information and services.   Encounter Outcome:  No Answer  Care Coordination Interventions Activated:  No   Care Coordination Interventions:  No, not indicated    [facility] [phone]

## 2022-02-06 DIAGNOSIS — R252 Cramp and spasm: Secondary | ICD-10-CM | POA: Diagnosis not present

## 2022-02-06 DIAGNOSIS — E1122 Type 2 diabetes mellitus with diabetic chronic kidney disease: Secondary | ICD-10-CM | POA: Diagnosis not present

## 2022-02-06 DIAGNOSIS — N1832 Chronic kidney disease, stage 3b: Secondary | ICD-10-CM | POA: Diagnosis not present

## 2022-02-06 DIAGNOSIS — Z0000 Encounter for general adult medical examination without abnormal findings: Secondary | ICD-10-CM | POA: Diagnosis not present

## 2022-02-06 DIAGNOSIS — D638 Anemia in other chronic diseases classified elsewhere: Secondary | ICD-10-CM | POA: Diagnosis not present

## 2022-02-06 DIAGNOSIS — Z79899 Other long term (current) drug therapy: Secondary | ICD-10-CM | POA: Diagnosis not present

## 2022-02-06 DIAGNOSIS — I129 Hypertensive chronic kidney disease with stage 1 through stage 4 chronic kidney disease, or unspecified chronic kidney disease: Secondary | ICD-10-CM | POA: Diagnosis not present

## 2022-02-06 DIAGNOSIS — Z23 Encounter for immunization: Secondary | ICD-10-CM | POA: Diagnosis not present

## 2022-02-23 ENCOUNTER — Telehealth: Payer: Self-pay

## 2022-02-23 NOTE — Patient Outreach (Signed)
  Care Coordination   02/23/2022 Name: CARELY NAPPIER MRN: [id number] DOB: 07-Feb-1936   Care Coordination Outreach Attempts:  A second unsuccessful outreach was attempted today to offer the patient with information about available care coordination services as a benefit of their health plan.     Follow Up Plan:  Additional outreach attempts will be made to offer the patient care coordination information and services.   Encounter Outcome:  No Answer  Care Coordination Interventions Activated:  No   Care Coordination Interventions:  No, not indicated    [place] Management [phone]

## 2022-02-26 DIAGNOSIS — I272 Pulmonary hypertension, unspecified: Secondary | ICD-10-CM | POA: Diagnosis not present

## 2022-02-26 DIAGNOSIS — E1122 Type 2 diabetes mellitus with diabetic chronic kidney disease: Secondary | ICD-10-CM | POA: Diagnosis not present

## 2022-02-26 DIAGNOSIS — N1832 Chronic kidney disease, stage 3b: Secondary | ICD-10-CM | POA: Diagnosis not present

## 2022-02-26 DIAGNOSIS — E785 Hyperlipidemia, unspecified: Secondary | ICD-10-CM | POA: Diagnosis not present

## 2022-02-26 DIAGNOSIS — I129 Hypertensive chronic kidney disease with stage 1 through stage 4 chronic kidney disease, or unspecified chronic kidney disease: Secondary | ICD-10-CM | POA: Diagnosis not present

## 2022-02-28 DIAGNOSIS — D631 Anemia in chronic kidney disease: Secondary | ICD-10-CM | POA: Diagnosis not present

## 2022-02-28 DIAGNOSIS — I129 Hypertensive chronic kidney disease with stage 1 through stage 4 chronic kidney disease, or unspecified chronic kidney disease: Secondary | ICD-10-CM | POA: Diagnosis not present

## 2022-02-28 DIAGNOSIS — N2581 Secondary hyperparathyroidism of renal origin: Secondary | ICD-10-CM | POA: Diagnosis not present

## 2022-02-28 DIAGNOSIS — N1832 Chronic kidney disease, stage 3b: Secondary | ICD-10-CM | POA: Diagnosis not present

## 2022-02-28 DIAGNOSIS — E1122 Type 2 diabetes mellitus with diabetic chronic kidney disease: Secondary | ICD-10-CM | POA: Diagnosis not present

## 2022-03-05 DIAGNOSIS — H43812 Vitreous degeneration, left eye: Secondary | ICD-10-CM | POA: Diagnosis not present

## 2022-04-10 ENCOUNTER — Other Ambulatory Visit: Payer: Self-pay | Admitting: Family Medicine

## 2022-04-10 ENCOUNTER — Other Ambulatory Visit: Payer: Self-pay | Admitting: Hematology and Oncology

## 2022-04-10 DIAGNOSIS — Z1231 Encounter for screening mammogram for malignant neoplasm of breast: Secondary | ICD-10-CM

## 2022-04-11 ENCOUNTER — Telehealth: Payer: Self-pay | Admitting: Hematology and Oncology

## 2022-04-11 NOTE — Telephone Encounter (Signed)
Called patient per 11/8 in basket. Rescheduled appointment with patient.

## 2022-04-29 DIAGNOSIS — S90112A Contusion of left great toe without damage to nail, initial encounter: Secondary | ICD-10-CM | POA: Diagnosis not present

## 2022-04-29 DIAGNOSIS — S8991XA Unspecified injury of right lower leg, initial encounter: Secondary | ICD-10-CM | POA: Diagnosis not present

## 2022-05-07 DIAGNOSIS — M25561 Pain in right knee: Secondary | ICD-10-CM | POA: Diagnosis not present

## 2022-05-07 DIAGNOSIS — Z96651 Presence of right artificial knee joint: Secondary | ICD-10-CM | POA: Diagnosis not present

## 2022-05-16 DIAGNOSIS — H43812 Vitreous degeneration, left eye: Secondary | ICD-10-CM | POA: Diagnosis not present

## 2022-05-16 DIAGNOSIS — H2513 Age-related nuclear cataract, bilateral: Secondary | ICD-10-CM | POA: Diagnosis not present

## 2022-05-16 DIAGNOSIS — E119 Type 2 diabetes mellitus without complications: Secondary | ICD-10-CM | POA: Diagnosis not present

## 2022-05-16 DIAGNOSIS — H04123 Dry eye syndrome of bilateral lacrimal glands: Secondary | ICD-10-CM | POA: Diagnosis not present

## 2022-05-17 DIAGNOSIS — N1832 Chronic kidney disease, stage 3b: Secondary | ICD-10-CM | POA: Diagnosis not present

## 2022-05-17 DIAGNOSIS — I272 Pulmonary hypertension, unspecified: Secondary | ICD-10-CM | POA: Diagnosis not present

## 2022-05-17 DIAGNOSIS — E785 Hyperlipidemia, unspecified: Secondary | ICD-10-CM | POA: Diagnosis not present

## 2022-05-17 DIAGNOSIS — I129 Hypertensive chronic kidney disease with stage 1 through stage 4 chronic kidney disease, or unspecified chronic kidney disease: Secondary | ICD-10-CM | POA: Diagnosis not present

## 2022-05-17 DIAGNOSIS — E1122 Type 2 diabetes mellitus with diabetic chronic kidney disease: Secondary | ICD-10-CM | POA: Diagnosis not present

## 2022-05-21 ENCOUNTER — Ambulatory Visit: Payer: Medicare Other | Admitting: Hematology and Oncology

## 2022-05-31 DIAGNOSIS — G4733 Obstructive sleep apnea (adult) (pediatric): Secondary | ICD-10-CM | POA: Diagnosis not present

## 2022-06-06 DIAGNOSIS — J0100 Acute maxillary sinusitis, unspecified: Secondary | ICD-10-CM | POA: Diagnosis not present

## 2022-06-12 ENCOUNTER — Ambulatory Visit
Admission: RE | Admit: 2022-06-12 | Discharge: 2022-06-12 | Disposition: A | Discharge status: Home / Self Care | Payer: Medicare Other | Source: Ambulatory Visit | Attending: Family Medicine | Admitting: Family Medicine

## 2022-06-12 DIAGNOSIS — Z1231 Encounter for screening mammogram for malignant neoplasm of breast: Secondary | ICD-10-CM

## 2022-06-18 ENCOUNTER — Inpatient Hospital Stay: Payer: Medicare Other | Attending: Hematology and Oncology | Admitting: Hematology and Oncology

## 2022-06-18 ENCOUNTER — Encounter: Payer: Self-pay | Admitting: Hematology and Oncology

## 2022-06-18 VITALS — BP 135/42 | HR 52 | Temp 98.1°F | Resp 16 | Ht 61.0 in | Wt 189.1 lb

## 2022-06-18 DIAGNOSIS — Z803 Family history of malignant neoplasm of breast: Secondary | ICD-10-CM | POA: Insufficient documentation

## 2022-06-18 DIAGNOSIS — C50011 Malignant neoplasm of nipple and areola, right female breast: Secondary | ICD-10-CM | POA: Diagnosis not present

## 2022-06-18 DIAGNOSIS — Z853 Personal history of malignant neoplasm of breast: Secondary | ICD-10-CM | POA: Insufficient documentation
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
## 2022-07-09 DIAGNOSIS — M9905 Segmental and somatic dysfunction of pelvic region: Secondary | ICD-10-CM | POA: Diagnosis not present

## 2022-07-09 DIAGNOSIS — M9904 Segmental and somatic dysfunction of sacral region: Secondary | ICD-10-CM | POA: Diagnosis not present

## 2022-07-09 DIAGNOSIS — M9903 Segmental and somatic dysfunction of lumbar region: Secondary | ICD-10-CM | POA: Diagnosis not present

## 2022-07-09 DIAGNOSIS — M5136 Other intervertebral disc degeneration, lumbar region: Secondary | ICD-10-CM | POA: Diagnosis not present

## 2022-07-10 DIAGNOSIS — R350 Frequency of micturition: Secondary | ICD-10-CM | POA: Diagnosis not present

## 2022-07-11 DIAGNOSIS — M9905 Segmental and somatic dysfunction of pelvic region: Secondary | ICD-10-CM | POA: Diagnosis not present

## 2022-07-11 DIAGNOSIS — M5136 Other intervertebral disc degeneration, lumbar region: Secondary | ICD-10-CM | POA: Diagnosis not present

## 2022-07-11 DIAGNOSIS — M9903 Segmental and somatic dysfunction of lumbar region: Secondary | ICD-10-CM | POA: Diagnosis not present

## 2022-07-11 DIAGNOSIS — M9904 Segmental and somatic dysfunction of sacral region: Secondary | ICD-10-CM | POA: Diagnosis not present

## 2022-07-16 DIAGNOSIS — M9905 Segmental and somatic dysfunction of pelvic region: Secondary | ICD-10-CM | POA: Diagnosis not present

## 2022-07-16 DIAGNOSIS — M9903 Segmental and somatic dysfunction of lumbar region: Secondary | ICD-10-CM | POA: Diagnosis not present

## 2022-07-16 DIAGNOSIS — M9904 Segmental and somatic dysfunction of sacral region: Secondary | ICD-10-CM | POA: Diagnosis not present

## 2022-07-16 DIAGNOSIS — M5136 Other intervertebral disc degeneration, lumbar region: Secondary | ICD-10-CM | POA: Diagnosis not present

## 2022-08-07 DIAGNOSIS — L739 Follicular disorder, unspecified: Secondary | ICD-10-CM | POA: Diagnosis not present

## 2022-08-07 DIAGNOSIS — I471 Supraventricular tachycardia, unspecified: Secondary | ICD-10-CM | POA: Diagnosis not present

## 2022-08-07 DIAGNOSIS — N1832 Chronic kidney disease, stage 3b: Secondary | ICD-10-CM | POA: Diagnosis not present

## 2022-08-07 DIAGNOSIS — E1122 Type 2 diabetes mellitus with diabetic chronic kidney disease: Secondary | ICD-10-CM | POA: Diagnosis not present

## 2022-08-07 DIAGNOSIS — Z9989 Dependence on other enabling machines and devices: Secondary | ICD-10-CM | POA: Diagnosis not present

## 2022-08-21 DIAGNOSIS — N1832 Chronic kidney disease, stage 3b: Secondary | ICD-10-CM | POA: Diagnosis not present

## 2022-08-21 DIAGNOSIS — D631 Anemia in chronic kidney disease: Secondary | ICD-10-CM | POA: Diagnosis not present

## 2022-08-21 DIAGNOSIS — E1122 Type 2 diabetes mellitus with diabetic chronic kidney disease: Secondary | ICD-10-CM | POA: Diagnosis not present

## 2022-08-21 DIAGNOSIS — I129 Hypertensive chronic kidney disease with stage 1 through stage 4 chronic kidney disease, or unspecified chronic kidney disease: Secondary | ICD-10-CM | POA: Diagnosis not present

## 2022-08-21 DIAGNOSIS — N2581 Secondary hyperparathyroidism of renal origin: Secondary | ICD-10-CM | POA: Diagnosis not present

## 2022-08-29 DIAGNOSIS — G4733 Obstructive sleep apnea (adult) (pediatric): Secondary | ICD-10-CM | POA: Diagnosis not present

## 2022-09-07 DIAGNOSIS — M7541 Impingement syndrome of right shoulder: Secondary | ICD-10-CM | POA: Diagnosis not present

## 2022-09-22 DIAGNOSIS — B349 Viral infection, unspecified: Secondary | ICD-10-CM | POA: Diagnosis not present

## 2022-09-22 DIAGNOSIS — R051 Acute cough: Secondary | ICD-10-CM | POA: Diagnosis not present

## 2022-09-24 DIAGNOSIS — J069 Acute upper respiratory infection, unspecified: Secondary | ICD-10-CM | POA: Diagnosis not present

## 2022-09-28 ENCOUNTER — Encounter: Payer: Self-pay | Admitting: Cardiology

## 2022-09-28 ENCOUNTER — Ambulatory Visit: Payer: Medicare Other | Attending: Cardiology | Admitting: Cardiology

## 2022-09-28 VITALS — BP 110/56 | HR 47 | Ht 61.5 in | Wt 180.4 lb

## 2022-09-28 DIAGNOSIS — G4733 Obstructive sleep apnea (adult) (pediatric): Secondary | ICD-10-CM

## 2022-09-28 DIAGNOSIS — I10 Essential (primary) hypertension: Secondary | ICD-10-CM

## 2022-09-28 NOTE — Patient Instructions (Signed)
Medication Instructions:  Your physician recommends that you continue on your current medications as directed. Please refer to the Current Medication list given to you today.  *If you need a refill on your cardiac medications before your next appointment, please call your pharmacy*   Lab Work: None.  If you have labs (blood work) drawn today and your tests are completely normal, you will receive your results only by: MyChart Message (if you have MyChart) OR A paper copy in the mail If you have any lab test that is abnormal or we need to change your treatment, we will call you to review the results.   Testing/Procedures: None.   Follow-Up: At [facility], you and your health needs are our priority.  As part of our continuing mission to provide you with exceptional heart care, we have created designated Provider Care Teams.  These Care Teams include your primary Cardiologist (physician) and Advanced Practice Providers (APPs -  Physician Assistants and Nurse Practitioners) who all work together to provide you with the care you need, when you need it.  We recommend signing up for the patient portal called "MyChart".  Sign up information is provided on this After Visit Summary.  MyChart is used to connect with patients for Virtual Visits (Telemedicine).  Patients are able to view lab/test results, encounter notes, upcoming appointments, etc.  Non-urgent messages can be sent to your provider as well.   To learn more about what you can do with MyChart, go to [email or web address].    Your next appointment:   1 year(s)  Provider:   Dr. Traci Turner, MD   

## 2022-09-28 NOTE — Progress Notes (Signed)
Evaluation Performed:  Follow-up visit Date:  09/28/2022   ID:  [name], [name] 09-Mar-1936, MRN [id number]   PCP:  Tiffany Hazel, MD  Cardiologist: Tiffany Meres, MD Sleep medicine:  Tiffany Magic, MD Electrophysiologist:  None   Chief Complaint:  OSA, HTN  History of Present Illness:    [name] is a 87 y.o. female with a hx of  OSA, obesity, moderate pulmonary HTN, diastolic dysfunction and HTN. She is doing well with her PAP device and thinks that She has gotten used to it.  She tolerates the mask and feels the pressure is adequate.  Since going on PAP she feels rested in the am and has no significant daytime sleepiness.  She denies any significant mouth or nasal dryness or nasal congestion.  She does not think that he snores.     Prior CV studies:   The following studies were reviewed today:  PAP compliance download from [facility] D[facility]  Past Medical History:  Diagnosis Date   Anemia    takes iron   Arthritis    Dr Sherlean Foot   Carotid artery stenosis    1-39% bilateral dopplers 08/2015   Cataracts, bilateral    Chronic kidney disease    stage III Dr Briant Cedar   Diabetes mellitus    Family history of breast cancer    Family history of breast cancer    Family history of melanoma    GERD (gastroesophageal reflux disease)    Heart murmur    Dr Eldridge Dace   HTN (hypertension)    Hyperlipidemia    Morbid obesity with BMI of 40.0-44.9, adult (HCC)    Obstructive sleep apnea (adult) (pediatric)    w CPAP Severe OSA AHI 52/hr CPAP at 10cm H2O   Pulmonary HTN (HCC)    mild with PASP echo 02/2014   Right carotid bruit 08/19/2014   Shortness of breath    resolved with CPAP   Urination frequency    Vitamin D deficiency disease    Wears dentures    upper   Wears glasses    Past Surgical History:  Procedure Laterality Date   ABDOMINAL HYSTERECTOMY     partial   APPENDECTOMY     CARDIOVASCULAR STRESS TEST  2011   CHOLECYSTECTOMY N/A 10/04/2015   Procedure:  LAPAROSCOPIC CHOLECYSTECTOMY;  Surgeon: Abigail Miyamoto, MD;  Location: [facility];  Service: General;  Laterality: N/A;   COLONOSCOPY  01/15/2012   Procedure: COLONOSCOPY;  Surgeon: Shirley Friar, MD;  Location: [facility];  Service: Endoscopy;  Laterality: N/A;   ESOPHAGOGASTRODUODENOSCOPY  01/15/2012   Procedure: ESOPHAGOGASTRODUODENOSCOPY (EGD);  Surgeon: Shirley Friar, MD;  Location: [facility];  Service: Endoscopy;  Laterality: N/A;   JOINT REPLACEMENT Bilateral    Knees   left shoulder surgery      rotator cuff tear    MASTECTOMY, PARTIAL Right 06/11/2018   Procedure: RIGHT BREAST PARTIAL MASTECTOMY;  Surgeon: Abigail Miyamoto, MD;  Location: [facility] OR;  Service: General;  Laterality: Right;   sleep study  2012   TOTAL KNEE ARTHROPLASTY     Left   TOTAL KNEE ARTHROPLASTY  07/02/2011   Procedure: TOTAL KNEE ARTHROPLASTY;  Surgeon: Raymon Mutton, MD;  Location: [facility];  Service: Orthopedics;  Laterality: Right;   US ECHOCARDIOGRAPHY     2012     Current Meds  Medication Sig   acetaminophen (TYLENOL) 500 MG tablet Take 500 mg by mouth every 8 (eight) hours as needed for mild pain, moderate  pain, fever or headache.    amLODipine (NORVASC) 10 MG tablet Take 10 mg by mouth daily.   aspirin EC 81 MG tablet Take 81 mg by mouth daily.   atorvastatin (LIPITOR) 40 MG tablet Take 40 mg by mouth at bedtime.   Cholecalciferol (VITAMIN D) 2000 UNITS CAPS Take 2,000 Units by mouth daily.   docusate sodium (COLACE) 100 MG capsule Take 100 mg by mouth daily as needed for mild constipation.   doxycycline (VIBRAMYCIN) 100 MG capsule Take 100 mg by mouth 2 (two) times daily.   Esomeprazole Magnesium 20 MG TBEC Take 20 mg by mouth daily.    ezetimibe (ZETIA) 10 MG tablet Take 10 mg by mouth at bedtime.   FARXIGA 10 MG TABS tablet Take 10 mg by mouth daily.   furosemide (LASIX) 40 MG tablet TAKE 1/2 TABLET BY MOUTH DAILY   ipratropium (ATROVENT) 0.06 % nasal spray 2 sprays in each nostril  Nasally four times a day for 30 days   loratadine (CLARITIN) 10 MG tablet 1 tablet Orally as needed   LORazepam (ATIVAN) 0.5 MG tablet Take 0.5 mg by mouth 2 (two) times daily. As needed   meclizine (ANTIVERT) 12.5 MG tablet 1-2 tablets orally as needed for dizziness   Multiple Vitamins-Minerals (MULTIVITAMINS THER. W/MINERALS) TABS Take 1 tablet by mouth daily.   omeprazole (PRILOSEC) 20 MG capsule 1 capsule 30 minutes before morning meal Orally Once a day for 90 days   promethazine-dextromethorphan (PROMETHAZINE-DM) 6.25-15 MG/5ML syrup 5mL as needed Orally every 6 hrs for 9 days   spironolactone (ALDACTONE) 25 MG tablet TAKE 1 TABLET BY MOUTH EVERY  OTHER DAY     Allergies:   Codeine and Ibuprofen   Social History   Tobacco Use   Smoking status: Never   Smokeless tobacco: Never  Vaping Use   Vaping Use: Never used  Substance Use Topics   Alcohol use: No   Drug use: No     Family Hx: The patient's family history includes Breast cancer in her maternal aunt; Diabetes in her father and mother; Melanoma in her cousin.  ROS:   Please see the history of present illness.     All other systems reviewed and are negative.   Labs/Other Tests and Data Reviewed:    Recent Labs: No results found for requested labs within last 365 days.   Recent Lipid Panel No results found for: "CHOL", "TRIG", "HDL", "CHOLHDL", "LDLCALC", "LDLDIRECT"  Wt Readings from Last 3 Encounters:  09/28/22 180 lb 6.4 oz (81.8 kg)  06/18/22 189 lb 1.6 oz (85.8 kg)  08/24/21 181 lb (82.1 kg)    EKG performed and demonstrates sinus bradycardia with RBBB  Objective:    Vital Signs:  BP 110/77mmHg, Hr 47bpm, O2 sats 99% on rA GEN: Well nourished, well developed in no acute distress HEENT: Normal NECK: No JVD; No carotid bruits LYMPHATICS: No lymphadenopathy CARDIAC:RRR, no murmurs, rubs, gallops RESPIRATORY:  Clear to auscultation without rales, wheezing or rhonchi  ABDOMEN: Soft, non-tender,  non-distended MUSCULOSKELETAL:  No edema; No deformity  SKIN: Warm and dry NEUROLOGIC:  Alert and oriented x 3 PSYCHIATRIC:  Normal affect  ASSESSMENT & PLAN:    1.  OSA - The patient is tolerating PAP therapy well without any problems. The PAP download performed by his DME was personally reviewed and interpreted by me today and showed an AHI of 0.3 /hr on 10 cm H2O with 80 % compliance in using more than 4 hours nightly.  The patient  has been using and benefiting from PAP use and will continue to benefit from therapy.    2.  HTN -BP is well-controlled -Continue prescription drug management with amlodipine 10 mg daily and spironolactone 25 mg every other day with as needed refills  3.  Bradycardia -she is asymptomatic  Medication Adjustments/Labs and Tests Ordered: Current medicines are reviewed at length with the patient today.  Concerns regarding medicines are outlined above.  Tests Ordered: Orders Placed This Encounter  Procedures   EKG 12-Lead   Medication Changes: No orders of the defined types were placed in this encounter.   Disposition:  Follow up in 1 year(s)  Signed, Tiffany Magic, MD  09/28/2022 8:26 AM     [facility]

## 2022-10-04 DIAGNOSIS — H1132 Conjunctival hemorrhage, left eye: Secondary | ICD-10-CM | POA: Diagnosis not present

## 2022-10-08 DIAGNOSIS — M25511 Pain in right shoulder: Secondary | ICD-10-CM | POA: Diagnosis not present

## 2022-10-08 DIAGNOSIS — M6281 Muscle weakness (generalized): Secondary | ICD-10-CM | POA: Diagnosis not present

## 2022-10-17 DIAGNOSIS — M25511 Pain in right shoulder: Secondary | ICD-10-CM | POA: Diagnosis not present

## 2022-10-17 DIAGNOSIS — M6281 Muscle weakness (generalized): Secondary | ICD-10-CM | POA: Diagnosis not present

## 2022-10-20 DIAGNOSIS — M7918 Myalgia, other site: Secondary | ICD-10-CM | POA: Diagnosis not present

## 2022-10-22 DIAGNOSIS — M6281 Muscle weakness (generalized): Secondary | ICD-10-CM | POA: Diagnosis not present

## 2022-10-22 DIAGNOSIS — M25511 Pain in right shoulder: Secondary | ICD-10-CM | POA: Diagnosis not present

## 2022-10-31 DIAGNOSIS — M25551 Pain in right hip: Secondary | ICD-10-CM | POA: Diagnosis not present

## 2022-11-14 DIAGNOSIS — M9903 Segmental and somatic dysfunction of lumbar region: Secondary | ICD-10-CM | POA: Diagnosis not present

## 2022-11-14 DIAGNOSIS — M9905 Segmental and somatic dysfunction of pelvic region: Secondary | ICD-10-CM | POA: Diagnosis not present

## 2022-11-14 DIAGNOSIS — M5136 Other intervertebral disc degeneration, lumbar region: Secondary | ICD-10-CM | POA: Diagnosis not present

## 2022-11-14 DIAGNOSIS — M9904 Segmental and somatic dysfunction of sacral region: Secondary | ICD-10-CM | POA: Diagnosis not present

## 2022-11-15 DIAGNOSIS — M5136 Other intervertebral disc degeneration, lumbar region: Secondary | ICD-10-CM | POA: Diagnosis not present

## 2022-11-15 DIAGNOSIS — M9904 Segmental and somatic dysfunction of sacral region: Secondary | ICD-10-CM | POA: Diagnosis not present

## 2022-11-15 DIAGNOSIS — M9905 Segmental and somatic dysfunction of pelvic region: Secondary | ICD-10-CM | POA: Diagnosis not present

## 2022-11-15 DIAGNOSIS — M9903 Segmental and somatic dysfunction of lumbar region: Secondary | ICD-10-CM | POA: Diagnosis not present

## 2022-11-19 DIAGNOSIS — H43812 Vitreous degeneration, left eye: Secondary | ICD-10-CM | POA: Diagnosis not present

## 2022-11-19 DIAGNOSIS — H2513 Age-related nuclear cataract, bilateral: Secondary | ICD-10-CM | POA: Diagnosis not present

## 2022-11-19 DIAGNOSIS — H04123 Dry eye syndrome of bilateral lacrimal glands: Secondary | ICD-10-CM | POA: Diagnosis not present

## 2022-11-19 DIAGNOSIS — H35033 Hypertensive retinopathy, bilateral: Secondary | ICD-10-CM | POA: Diagnosis not present

## 2022-11-20 DIAGNOSIS — M9905 Segmental and somatic dysfunction of pelvic region: Secondary | ICD-10-CM | POA: Diagnosis not present

## 2022-11-20 DIAGNOSIS — M9903 Segmental and somatic dysfunction of lumbar region: Secondary | ICD-10-CM | POA: Diagnosis not present

## 2022-11-20 DIAGNOSIS — M5136 Other intervertebral disc degeneration, lumbar region: Secondary | ICD-10-CM | POA: Diagnosis not present

## 2022-11-20 DIAGNOSIS — M9904 Segmental and somatic dysfunction of sacral region: Secondary | ICD-10-CM | POA: Diagnosis not present

## 2022-11-21 DIAGNOSIS — M9904 Segmental and somatic dysfunction of sacral region: Secondary | ICD-10-CM | POA: Diagnosis not present

## 2022-11-21 DIAGNOSIS — M9905 Segmental and somatic dysfunction of pelvic region: Secondary | ICD-10-CM | POA: Diagnosis not present

## 2022-11-21 DIAGNOSIS — M5136 Other intervertebral disc degeneration, lumbar region: Secondary | ICD-10-CM | POA: Diagnosis not present

## 2022-11-21 DIAGNOSIS — M9903 Segmental and somatic dysfunction of lumbar region: Secondary | ICD-10-CM | POA: Diagnosis not present

## 2022-11-27 DIAGNOSIS — G4733 Obstructive sleep apnea (adult) (pediatric): Secondary | ICD-10-CM | POA: Diagnosis not present

## 2022-12-11 ENCOUNTER — Other Ambulatory Visit (HOSPITAL_COMMUNITY): Payer: Self-pay | Admitting: Internal Medicine

## 2022-12-12 DIAGNOSIS — Z9989 Dependence on other enabling machines and devices: Secondary | ICD-10-CM | POA: Diagnosis not present

## 2022-12-12 DIAGNOSIS — N1832 Chronic kidney disease, stage 3b: Secondary | ICD-10-CM | POA: Diagnosis not present

## 2022-12-12 DIAGNOSIS — I129 Hypertensive chronic kidney disease with stage 1 through stage 4 chronic kidney disease, or unspecified chronic kidney disease: Secondary | ICD-10-CM | POA: Diagnosis not present

## 2022-12-12 DIAGNOSIS — I272 Pulmonary hypertension, unspecified: Secondary | ICD-10-CM | POA: Diagnosis not present

## 2022-12-12 DIAGNOSIS — K588 Other irritable bowel syndrome: Secondary | ICD-10-CM | POA: Diagnosis not present

## 2022-12-12 DIAGNOSIS — D631 Anemia in chronic kidney disease: Secondary | ICD-10-CM | POA: Diagnosis not present

## 2022-12-12 DIAGNOSIS — E1122 Type 2 diabetes mellitus with diabetic chronic kidney disease: Secondary | ICD-10-CM | POA: Diagnosis not present

## 2022-12-12 DIAGNOSIS — Z23 Encounter for immunization: Secondary | ICD-10-CM | POA: Diagnosis not present

## 2022-12-12 DIAGNOSIS — C50019 Malignant neoplasm of nipple and areola, unspecified female breast: Secondary | ICD-10-CM | POA: Diagnosis not present

## 2023-02-18 DIAGNOSIS — I129 Hypertensive chronic kidney disease with stage 1 through stage 4 chronic kidney disease, or unspecified chronic kidney disease: Secondary | ICD-10-CM | POA: Diagnosis not present

## 2023-02-18 DIAGNOSIS — N2581 Secondary hyperparathyroidism of renal origin: Secondary | ICD-10-CM | POA: Diagnosis not present

## 2023-02-18 DIAGNOSIS — E1122 Type 2 diabetes mellitus with diabetic chronic kidney disease: Secondary | ICD-10-CM | POA: Diagnosis not present

## 2023-02-18 DIAGNOSIS — G4730 Sleep apnea, unspecified: Secondary | ICD-10-CM | POA: Diagnosis not present

## 2023-02-18 DIAGNOSIS — N1832 Chronic kidney disease, stage 3b: Secondary | ICD-10-CM | POA: Diagnosis not present

## 2023-02-18 DIAGNOSIS — D631 Anemia in chronic kidney disease: Secondary | ICD-10-CM | POA: Diagnosis not present

## 2023-02-19 ENCOUNTER — Other Ambulatory Visit (HOSPITAL_COMMUNITY): Payer: Self-pay | Admitting: Internal Medicine

## 2023-02-19 DIAGNOSIS — Z9181 History of falling: Secondary | ICD-10-CM | POA: Diagnosis not present

## 2023-02-19 DIAGNOSIS — Z0000 Encounter for general adult medical examination without abnormal findings: Secondary | ICD-10-CM | POA: Diagnosis not present

## 2023-02-19 DIAGNOSIS — D631 Anemia in chronic kidney disease: Secondary | ICD-10-CM | POA: Diagnosis not present

## 2023-02-19 DIAGNOSIS — Z9989 Dependence on other enabling machines and devices: Secondary | ICD-10-CM | POA: Diagnosis not present

## 2023-02-19 DIAGNOSIS — Z23 Encounter for immunization: Secondary | ICD-10-CM | POA: Diagnosis not present

## 2023-02-19 DIAGNOSIS — E1122 Type 2 diabetes mellitus with diabetic chronic kidney disease: Secondary | ICD-10-CM | POA: Diagnosis not present

## 2023-02-19 DIAGNOSIS — N1832 Chronic kidney disease, stage 3b: Secondary | ICD-10-CM | POA: Diagnosis not present

## 2023-02-22 DIAGNOSIS — N390 Urinary tract infection, site not specified: Secondary | ICD-10-CM | POA: Diagnosis not present

## 2023-03-19 DIAGNOSIS — L299 Pruritus, unspecified: Secondary | ICD-10-CM | POA: Diagnosis not present

## 2023-03-29 DIAGNOSIS — L282 Other prurigo: Secondary | ICD-10-CM | POA: Diagnosis not present

## 2023-04-10 DIAGNOSIS — M5136 Other intervertebral disc degeneration, lumbar region with discogenic back pain only: Secondary | ICD-10-CM | POA: Diagnosis not present

## 2023-04-10 DIAGNOSIS — L282 Other prurigo: Secondary | ICD-10-CM | POA: Diagnosis not present

## 2023-04-10 DIAGNOSIS — M9903 Segmental and somatic dysfunction of lumbar region: Secondary | ICD-10-CM | POA: Diagnosis not present

## 2023-04-10 DIAGNOSIS — M9904 Segmental and somatic dysfunction of sacral region: Secondary | ICD-10-CM | POA: Diagnosis not present

## 2023-04-10 DIAGNOSIS — M9905 Segmental and somatic dysfunction of pelvic region: Secondary | ICD-10-CM | POA: Diagnosis not present

## 2023-04-15 DIAGNOSIS — M9903 Segmental and somatic dysfunction of lumbar region: Secondary | ICD-10-CM | POA: Diagnosis not present

## 2023-04-15 DIAGNOSIS — M5136 Other intervertebral disc degeneration, lumbar region with discogenic back pain only: Secondary | ICD-10-CM | POA: Diagnosis not present

## 2023-04-15 DIAGNOSIS — M9905 Segmental and somatic dysfunction of pelvic region: Secondary | ICD-10-CM | POA: Diagnosis not present

## 2023-04-15 DIAGNOSIS — M9904 Segmental and somatic dysfunction of sacral region: Secondary | ICD-10-CM | POA: Diagnosis not present

## 2023-04-16 DIAGNOSIS — M9905 Segmental and somatic dysfunction of pelvic region: Secondary | ICD-10-CM | POA: Diagnosis not present

## 2023-04-16 DIAGNOSIS — M9903 Segmental and somatic dysfunction of lumbar region: Secondary | ICD-10-CM | POA: Diagnosis not present

## 2023-04-16 DIAGNOSIS — M5136 Other intervertebral disc degeneration, lumbar region with discogenic back pain only: Secondary | ICD-10-CM | POA: Diagnosis not present

## 2023-04-16 DIAGNOSIS — M9904 Segmental and somatic dysfunction of sacral region: Secondary | ICD-10-CM | POA: Diagnosis not present

## 2023-04-17 DIAGNOSIS — M9904 Segmental and somatic dysfunction of sacral region: Secondary | ICD-10-CM | POA: Diagnosis not present

## 2023-04-17 DIAGNOSIS — M9905 Segmental and somatic dysfunction of pelvic region: Secondary | ICD-10-CM | POA: Diagnosis not present

## 2023-04-17 DIAGNOSIS — M9903 Segmental and somatic dysfunction of lumbar region: Secondary | ICD-10-CM | POA: Diagnosis not present

## 2023-04-17 DIAGNOSIS — M5136 Other intervertebral disc degeneration, lumbar region with discogenic back pain only: Secondary | ICD-10-CM | POA: Diagnosis not present

## 2023-04-30 DIAGNOSIS — L508 Other urticaria: Secondary | ICD-10-CM | POA: Diagnosis not present

## 2023-04-30 DIAGNOSIS — N390 Urinary tract infection, site not specified: Secondary | ICD-10-CM | POA: Diagnosis not present

## 2023-05-01 ENCOUNTER — Other Ambulatory Visit (HOSPITAL_COMMUNITY): Payer: Self-pay | Admitting: Internal Medicine

## 2023-05-09 DIAGNOSIS — L309 Dermatitis, unspecified: Secondary | ICD-10-CM | POA: Diagnosis not present

## 2023-05-14 DIAGNOSIS — L508 Other urticaria: Secondary | ICD-10-CM | POA: Diagnosis not present

## 2023-05-23 DIAGNOSIS — E119 Type 2 diabetes mellitus without complications: Secondary | ICD-10-CM | POA: Diagnosis not present

## 2023-05-23 DIAGNOSIS — H04123 Dry eye syndrome of bilateral lacrimal glands: Secondary | ICD-10-CM | POA: Diagnosis not present

## 2023-05-23 DIAGNOSIS — H35033 Hypertensive retinopathy, bilateral: Secondary | ICD-10-CM | POA: Diagnosis not present

## 2023-05-23 DIAGNOSIS — H2513 Age-related nuclear cataract, bilateral: Secondary | ICD-10-CM | POA: Diagnosis not present

## 2023-06-14 ENCOUNTER — Ambulatory Visit
Admission: RE | Admit: 2023-06-14 | Discharge: 2023-06-14 | Disposition: A | Discharge status: Home / Self Care | Payer: Medicare Other | Source: Ambulatory Visit | Attending: Hematology and Oncology | Admitting: Hematology and Oncology

## 2023-06-14 DIAGNOSIS — Z1231 Encounter for screening mammogram for malignant neoplasm of breast: Secondary | ICD-10-CM | POA: Diagnosis not present

## 2023-06-14 DIAGNOSIS — C50011 Malignant neoplasm of nipple and areola, right female breast: Secondary | ICD-10-CM

## 2023-06-15 ENCOUNTER — Encounter (HOSPITAL_COMMUNITY): Payer: Self-pay

## 2023-06-15 ENCOUNTER — Ambulatory Visit (HOSPITAL_COMMUNITY)
Admission: EM | Admit: 2023-06-15 | Discharge: 2023-06-15 | Disposition: A | Discharge status: Home / Self Care | Payer: Medicare Other | Attending: Emergency Medicine | Admitting: Emergency Medicine

## 2023-06-15 DIAGNOSIS — M5441 Lumbago with sciatica, right side: Secondary | ICD-10-CM | POA: Diagnosis not present

## 2023-06-15 DIAGNOSIS — N3001 Acute cystitis with hematuria: Secondary | ICD-10-CM | POA: Diagnosis not present

## 2023-06-15 LAB — POCT URINALYSIS DIP (MANUAL ENTRY)
Bilirubin, UA: NEGATIVE
Glucose, UA: NEGATIVE mg/dL
Nitrite, UA: NEGATIVE
Protein Ur, POC: >=300 mg/dL — AB
Spec Grav, UA: 1.025
Urobilinogen, UA: 0.2 U/dL
pH, UA: 5.5

## 2023-06-15 MED ORDER — CEPHALEXIN 500 MG PO CAPS: 500.0000 mg | ORAL_CAPSULE | Freq: Two times a day (BID) | ORAL | 0 refills | Status: AC

## 2023-06-15 MED ORDER — PREDNISONE 10 MG PO TABS: 30.0000 mg | ORAL_TABLET | Freq: Every day | ORAL | 0 refills | Status: AC

## 2023-06-15 NOTE — ED Triage Notes (Signed)
 Patient reports that she has had dysuria and urinary frequency since 1500 today.  Patient also added that she has had  right lower back pain that radiates down the right leg x 2 days. Patient reports a history of sciatica.

## 2023-06-15 NOTE — ED Provider Notes (Signed)
 [facility]    CSN: [id number] Arrival date & time: 06/15/23  1621      History   Chief Complaint Chief Complaint  Patient presents with   Dysuria   Urinary Frequency    HPI [name] is a 88 y.o. female.  About 1-2 hour history of dysuria and urinary frequency. Also noticed some blood in urine.  Not having abdominal or flank pain, no fever Tolerating fluids  Also reporting 2 day history of right low back pain Radiates into right leg She has history of sciatica, lumbar stenosis, chronic back pain No recent injury, trauma, or fall Trying tylenol  that doesn't help  History of DM, CKD Fasting glucose in the 90s  Past Medical History:  Diagnosis Date   Anemia    takes iron   Arthritis    Dr Rubie   Carotid artery stenosis    1-39% bilateral dopplers 08/2015   Cataracts, bilateral    Chronic kidney disease    stage III Dr Froylan   Diabetes mellitus    Family history of breast cancer    Family history of breast cancer    Family history of melanoma    GERD (gastroesophageal reflux disease)    Heart murmur    Dr Dann   HTN (hypertension)    Hyperlipidemia    Morbid obesity with BMI of 40.0-44.9, adult (HCC)    Obstructive sleep apnea (adult) (pediatric)    w CPAP Severe OSA AHI 52/hr CPAP at 10cm H2O   Pulmonary HTN (HCC)    mild with PASP echo 02/2014   Right carotid bruit 08/19/2014   Shortness of breath    resolved with CPAP   Urination frequency    Vitamin D deficiency disease    Wears dentures    upper   Wears glasses     Patient Active Problem List   Diagnosis Date Noted   Genetic testing 05/06/2018   Family history of melanoma    Family history of breast cancer    Malignant neoplasm involving both nipple and areola of right breast in female, estrogen receptor negative (HCC) 04/08/2018   Paget's carcinoma of the nipple, right (HCC) 04/08/2018   S/P laparoscopic cholecystectomy 10/04/2015   Carotid artery stenosis     Right carotid bruit 08/19/2014   HTN (hypertension)    Obstructive sleep apnea    Morbid obesity with BMI of 40.0-44.9, adult (HCC)    Vitamin D deficiency disease    Pulmonary HTN (HCC)    GERD (gastroesophageal reflux disease)    Arthritis    Cataracts, bilateral    Shortness of breath    Heart murmur    Chronic kidney disease    Iron deficiency anemia, unspecified 01/15/2012    Past Surgical History:  Procedure Laterality Date   ABDOMINAL HYSTERECTOMY     partial   APPENDECTOMY     CARDIOVASCULAR STRESS TEST  2011   CHOLECYSTECTOMY N/A 10/04/2015   Procedure: LAPAROSCOPIC CHOLECYSTECTOMY;  Surgeon: Vicenta Poli, MD;  Location: [facility];  Service: General;  Laterality: N/A;   COLONOSCOPY  01/15/2012   Procedure: COLONOSCOPY;  Surgeon: Jerrell KYM Sol, MD;  Location: [facility];  Service: Endoscopy;  Laterality: N/A;   ESOPHAGOGASTRODUODENOSCOPY  01/15/2012   Procedure: ESOPHAGOGASTRODUODENOSCOPY (EGD);  Surgeon: Jerrell KYM Sol, MD;  Location: [facility];  Service: Endoscopy;  Laterality: N/A;   JOINT REPLACEMENT Bilateral    Knees   left shoulder surgery      rotator cuff tear  MASTECTOMY, PARTIAL Right 06/11/2018   Procedure: RIGHT BREAST PARTIAL MASTECTOMY;  Surgeon: Vernetta Berg, MD;  Location: [facility] OR;  Service: General;  Laterality: Right;   sleep study  2012   TOTAL KNEE ARTHROPLASTY     Left   TOTAL KNEE ARTHROPLASTY  07/02/2011   Procedure: TOTAL KNEE ARTHROPLASTY;  Surgeon: Garnette JONETTA Raman, MD;  Location: [facility];  Service: Orthopedics;  Laterality: Right;   US  ECHOCARDIOGRAPHY     2012    OB History   No obstetric history on file.      Home Medications    Prior to Admission medications   Medication Sig Start Date End Date Taking? Authorizing Provider  cephALEXin  (KEFLEX ) 500 MG capsule Take 1 capsule (500 mg total) by mouth 2 (two) times daily for 7 days. 06/15/23 06/22/23 Yes Armeda Plumb, [place], PA-C  predniSONE  (DELTASONE ) 10 MG tablet Take 3  tablets (30 mg total) by mouth daily with breakfast for 5 days. 06/15/23 06/20/23 Yes Courtenay Creger, [place], PA-C  acetaminophen  (TYLENOL ) 500 MG tablet Take 500 mg by mouth every 8 (eight) hours as needed for mild pain, moderate pain, fever or headache.     [provider]  amLODipine  (NORVASC ) 10 MG tablet Take 10 mg by mouth daily. 02/17/15   [provider]  aspirin EC 81 MG tablet Take 81 mg by mouth daily.    [provider]  atorvastatin  (LIPITOR) 40 MG tablet Take 40 mg by mouth at bedtime.    [provider]  Cholecalciferol (VITAMIN D) 2000 UNITS CAPS Take 2,000 Units by mouth daily.    [provider]  docusate sodium  (COLACE) 100 MG capsule Take 100 mg by mouth daily as needed for mild constipation.    [provider]  Esomeprazole  Magnesium  20 MG TBEC Take 20 mg by mouth daily.     [provider]  ezetimibe  (ZETIA ) 10 MG tablet Take 10 mg by mouth at bedtime.    [provider]  furosemide  (LASIX ) 40 MG tablet TAKE 1/2 TABLET BY MOUTH DAILY 03/30/21   McLean, Dalton S, MD  loratadine (CLARITIN) 10 MG tablet 1 tablet Orally as needed 05/07/22   [provider]  LORazepam  (ATIVAN ) 0.5 MG tablet Take 0.5 mg by mouth 2 (two) times daily. As needed    [provider]  meclizine (ANTIVERT) 12.5 MG tablet 1-2 tablets orally as needed for dizziness 05/07/22   [provider]  Multiple Vitamins-Minerals (MULTIVITAMINS THER. W/MINERALS) TABS Take 1 tablet by mouth daily.    [provider]  omeprazole (PRILOSEC) 20 MG capsule 1 capsule 30 minutes before morning meal Orally Once a day for 90 days 03/31/22   [provider]  spironolactone  (ALDACTONE ) 25 MG tablet TAKE 1 TABLET BY MOUTH EVERY  OTHER DAY 05/01/23   Bensimhon, Toribio SAUNDERS, MD    Family History Family History  Problem Relation Age of Onset   Diabetes Mother    Diabetes Father    Breast cancer Maternal Aunt        unkown age  of diagnosis   Melanoma Cousin     Social History Social History   Tobacco Use   Smoking status: Never   Smokeless tobacco: Never  Vaping Use   Vaping status: Never Used  Substance Use Topics   Alcohol use: No   Drug use: No     Allergies   Codeine and Ibuprofen   Review of Systems Review of Systems  Genitourinary:  Positive for dysuria and frequency.  As per HPI  Physical Exam Triage Vital Signs ED Triage Vitals  Encounter Vitals Group     BP 06/15/23 1628 (!) 133/59     Systolic BP Percentile --      Diastolic BP Percentile --      Pulse Rate 06/15/23 1628 (!) 58     Resp 06/15/23 1628 16     Temp 06/15/23 1628 97.6 F (36.4 C)     Temp Source 06/15/23 1628 Oral     SpO2 06/15/23 1628 96 %     Weight --      Height --      Head Circumference --      Peak Flow --      Pain Score 06/15/23 1633 8     Pain Loc --      Pain Education --      Exclude from Growth Chart --    No data found.  Updated Vital Signs BP (!) 133/59 (BP Location: Right Arm)   Pulse (!) 58   Temp 97.6 F (36.4 C) (Oral)   Resp 16   SpO2 96%     Physical Exam Vitals and nursing note reviewed.  Constitutional:      General: She is not in acute distress. HENT:     Mouth/Throat:     Mouth: Mucous membranes are moist.     Pharynx: Oropharynx is clear.  Eyes:     Conjunctiva/sclera: Conjunctivae normal.     Pupils: Pupils are equal, round, and reactive to light.  Cardiovascular:     Rate and Rhythm: Normal rate and regular rhythm.     Heart sounds: Normal heart sounds.  Pulmonary:     Effort: Pulmonary effort is normal.     Breath sounds: Normal breath sounds.  Abdominal:     Palpations: Abdomen is soft.     Tenderness: There is no abdominal tenderness. There is no right CVA tenderness or left CVA tenderness.  Musculoskeletal:        General: Tenderness present.     Comments: Muscular tenderness of right glute into thigh. No back or spinal tenderness   Skin:     General: Skin is warm and dry.  Neurological:     Mental Status: She is alert and oriented to person, place, and time.     UC Treatments / Results  Labs (all labs ordered are listed, but only abnormal results are displayed) Labs Reviewed  POCT URINALYSIS DIP (MANUAL ENTRY) - Abnormal; Notable for the following components:      Result Value   Color, UA light yellow (*)    Clarity, UA turbid (*)    Ketones, POC UA trace (5) (*)    Blood, UA large (*)    Protein Ur, POC >=300 (*)    Leukocytes, UA Large (3+) (*)    All other components within normal limits  URINE CULTURE    EKG   Radiology MM 3D SCREEN BREAST BILATERAL Result Date: 06/14/2023 CLINICAL DATA:  Screening. Technologist indicates best positioning/images possible. EXAM: DIGITAL SCREENING BILATERAL MAMMOGRAM WITH TOMOSYNTHESIS AND CAD TECHNIQUE: Bilateral screening digital craniocaudal and mediolateral oblique mammograms were obtained. Bilateral screening digital breast tomosynthesis was performed. The images were evaluated with computer-aided detection. COMPARISON:  Previous exam(s). ACR Breast Density Category a: The breasts are almost entirely fatty. FINDINGS: There are no findings suspicious for malignancy. IMPRESSION: No mammographic evidence of malignancy. A result letter of this screening mammogram will be mailed directly to the patient. RECOMMENDATION: Screening mammogram  in one year. (Code:[id number]-B-01Y) BI-RADS CATEGORY  1: Negative. Electronically Signed   By: Leita Mattocks M.D.   On: 06/14/2023 16:14    Procedures Procedures   Medications Ordered in UC Medications - No data to display  Initial Impression / Assessment and Plan / UC Course  I have reviewed the triage vital signs and the nursing notes.  Pertinent labs & imaging results that were available during my care of the patient were reviewed by me and considered in my medical decision making (see chart for details).  UA with large leuks and RBCs Culture  pending. Will treat for acute cystitis with keflex  BID x 7 days  Right low back pain with sciatica Prednisone  30 mg daily x 5 Follow up with PCP and/or orthopedics No red flags at this time  Final Clinical Impressions(s) / UC Diagnoses   Final diagnoses:  Acute cystitis with hematuria  Acute right-sided low back pain with right-sided sciatica     Discharge Instructions      Keflex  - twice daily (morning and night) for 7 days in a row. Always take with food! This is the antibiotic to treat urinary tract infection  Prednisone  - 3 tablets (30 mg) daily for 5 days in a row. This is the steroid to treat inflammation, sciatica  We will call you in 2-3 days if the urine culture results and requires a change in treatment     ED Prescriptions     Medication Sig Dispense Auth. Provider   cephALEXin  (KEFLEX ) 500 MG capsule Take 1 capsule (500 mg total) by mouth 2 (two) times daily for 7 days. 14 capsule Ciarra Braddy, PA-C   predniSONE  (DELTASONE ) 10 MG tablet Take 3 tablets (30 mg total) by mouth daily with breakfast for 5 days. 15 tablet Tawan Degroote, [place], PA-C      PDMP not reviewed this encounter.   Zela Sobieski, PA-C 06/15/23 1728

## 2023-06-15 NOTE — Discharge Instructions (Addendum)
 Keflex  - twice daily (morning and night) for 7 days in a row. Always take with food! This is the antibiotic to treat urinary tract infection  Prednisone  - 3 tablets (30 mg) daily for 5 days in a row. This is the steroid to treat inflammation, sciatica  We will call you in 2-3 days if the urine culture results and requires a change in treatment

## 2023-06-17 LAB — URINE CULTURE: Culture: 10000 — AB

## 2023-06-21 ENCOUNTER — Inpatient Hospital Stay: Payer: Medicare Other | Attending: Hematology and Oncology | Admitting: Hematology and Oncology

## 2023-06-21 VITALS — BP 136/54 | HR 55 | Temp 97.9°F | Resp 17 | Wt 180.0 lb

## 2023-06-21 DIAGNOSIS — Z90710 Acquired absence of both cervix and uterus: Secondary | ICD-10-CM | POA: Diagnosis not present

## 2023-06-21 DIAGNOSIS — Z171 Estrogen receptor negative status [ER-]: Secondary | ICD-10-CM | POA: Insufficient documentation

## 2023-06-21 DIAGNOSIS — C50011 Malignant neoplasm of nipple and areola, right female breast: Secondary | ICD-10-CM | POA: Insufficient documentation

## 2023-06-21 DIAGNOSIS — Z9011 Acquired absence of right breast and nipple: Secondary | ICD-10-CM | POA: Diagnosis not present

## 2023-06-21 NOTE — Progress Notes (Signed)
[facility]  [phone] [phone]     ID: RESA ANLIKER DOB: 04/24/1936  MR#: [id number]  NGE#:[id number]  Patient Care Team: Sigmund Hazel, MD as PCP - General (Family Medicine) Daisy Floro, MD as Consulting Physician (Family Medicine) Abigail Miyamoto, MD as Consulting Physician (General Surgery) Magrinat, Valentino Hue, MD (Inactive) as Consulting Physician (Oncology) Bensimhon, Bevelyn Buckles, MD as Consulting Physician (Cardiology) Lonie Peak, MD as Attending Physician (Radiation Oncology) Nita Sells, MD (Dermatology) OTHER MD:  CHIEF COMPLAINT: Paget's disease of the right nipple, estrogen receptor negative, HER-2 amplified  CURRENT TREATMENT: observation   INTERVAL HISTORY  Discussed the use of AI scribe software for clinical note transcription with the patient, who gave verbal consent to proceed.  History of Present Illness    The patient, with a history of breast cancer, presents for a routine follow-up. She reports no changes in her health status since her last visit. She denies any new symptoms, including changes in her breasts, difficulty breathing, bowel changes, or urinary symptoms. She remains active, cooking and spending time with her granddaughters.    COVID 19 VACCINATION STATUS: Status postvaccination x5 as of December 2020  HISTORY OF PRESENT ILLNESS: From the original intake note:  [name] is a 88 y.o. female with a new diagnosis of Paget's disease of the right nipple.   The patient reports discoloration of the right nipple present for approximately 2 years.  She eventually developed some nipple bleeding and scabbing.  She had bilateral diagnostic mammography with tomography [facility] showing the breast density to be category B.  Mammography and ultrasonography were unremarkable, but on exam the right nipple was discolored, pink and somewhat scabbed.  There was  no bleeding or discharge.  As the nipple problem persisted the patient was referred to dermatology and on 03/07/2018 Dr. Margo Aye did a shave biopsy of the right nipple showing ([id number]) Paget's disease of the nipple, estrogen and progesterone receptor negative, but HER-2 amplified by immunohistochemistry (3+).  The patient was then referred to Dr. Magnus Ivan who set her up for repeat mammogram and Breast US done on 04/07/2018.  This showed recently diagnosed Paget's disease of the right nipple. No evidence of malignancy beyond the right nipple and areola. No evidence of metastatic adenopathy. Normal appearance of the left breast.   The patient's subsequent history is as detailed below   PAST MEDICAL HISTORY: Past Medical History:  Diagnosis Date   Anemia    takes iron   Arthritis    Dr Sherlean Foot   Carotid artery stenosis    1-39% bilateral dopplers 08/2015   Cataracts, bilateral    Chronic kidney disease    stage III Dr Briant Cedar   Diabetes mellitus    Family history of breast cancer    Family history of breast cancer    Family history of melanoma    GERD (gastroesophageal reflux disease)    Heart murmur    Dr Eldridge Dace   HTN (hypertension)    Hyperlipidemia    Morbid obesity with BMI of 40.0-44.9, adult (HCC)    Obstructive sleep apnea (adult) (pediatric)    w CPAP Severe OSA AHI 52/hr CPAP at 10cm H2O   Pulmonary HTN (HCC)    mild with PASP echo 02/2014   Right carotid bruit 08/19/2014   Shortness of breath    resolved with CPAP   Urination frequency    Vitamin D deficiency disease  Wears dentures    upper   Wears glasses     PAST SURGICAL HISTORY: Past Surgical History:  Procedure Laterality Date   ABDOMINAL HYSTERECTOMY     partial   APPENDECTOMY     CARDIOVASCULAR STRESS TEST  2011   CHOLECYSTECTOMY N/A 10/04/2015   Procedure: LAPAROSCOPIC CHOLECYSTECTOMY;  Surgeon: Abigail Miyamoto, MD;  Location: [facility] ORS;  Service: General;  Laterality: N/A;   COLONOSCOPY   01/15/2012   Procedure: COLONOSCOPY;  Surgeon: Shirley Friar, MD;  Location: [facility];  Service: Endoscopy;  Laterality: N/A;   ESOPHAGOGASTRODUODENOSCOPY  01/15/2012   Procedure: ESOPHAGOGASTRODUODENOSCOPY (EGD);  Surgeon: Shirley Friar, MD;  Location: [facility];  Service: Endoscopy;  Laterality: N/A;   JOINT REPLACEMENT Bilateral    Knees   left shoulder surgery      rotator cuff tear    MASTECTOMY, PARTIAL Right 06/11/2018   Procedure: RIGHT BREAST PARTIAL MASTECTOMY;  Surgeon: Abigail Miyamoto, MD;  Location: [facility] OR;  Service: General;  Laterality: Right;   sleep study  2012   TOTAL KNEE ARTHROPLASTY     Left   TOTAL KNEE ARTHROPLASTY  07/02/2011   Procedure: TOTAL KNEE ARTHROPLASTY;  Surgeon: Raymon Mutton, MD;  Location: [facility] OR;  Service: Orthopedics;  Laterality: Right;   US ECHOCARDIOGRAPHY     2012    FAMILY HISTORY Family History  Problem Relation Age of Onset   Diabetes Mother    Diabetes Father    Breast cancer Maternal Aunt        unkown age of diagnosis   Melanoma Cousin   Her father was diabetic, and died after a fall in his early 3s. Her mother was also a diabetic, died in her 69s due to diabetic complications. The patient has one full brother and 5 half brothers. She has 2 full sisters, one passed away from tonic obstructive pulmonary disease at age 31.  The patient also had 2 half-sisters, one half sister died from a heart disease.  3 out of 6 maternal aunts had breast cancer. She denies a family history of ovarian cancer.   GYNECOLOGIC HISTORY:  Menarche: 74 YO First child: age 43 GXP2  Hysterectomy in her 30s. She took hormone replacement for almost 30 years. 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 She lives with her daughter Gearldine Bienenstock and 2 granddaughters, aged 42 (a [employer] teaching in pregnancy centers), and 25 (completed college but not currently working). Her daughter Cala Bradford works at [employer] Her son  Charlsie Quest lives in [place] and has 2 children.  The patient has 2 great-grandchildren.  She is a member of a [employer]    ADVANCED DIRECTIVES: At the 04/11/2020 visit the patient was given the appropriate documents to complete and notarize at her discretion.  She intends to name her daughter Merry Proud as her healthcare power of attorney with her son Mellody Dance second   HEALTH MAINTENANCE: Social History   Tobacco Use   Smoking status: Never   Smokeless tobacco: Never  Vaping Use   Vaping status: Never Used  Substance Use Topics   Alcohol use: No   Drug use: No     Colonoscopy: at [facility]  PAP:  Bone density: Never  Lipid panel:   Allergies  Allergen Reactions   Codeine Itching    Other Reaction(s): hives   Ibuprofen Other (See Comments)    "gave her kidney trouble"  Other Reaction(s): kidneys   Current Outpatient Medications  Medication  Sig Dispense Refill   acetaminophen (TYLENOL) 500 MG tablet Take 500 mg by mouth every 8 (eight) hours as needed for mild pain, moderate pain, fever or headache.      amLODipine (NORVASC) 10 MG tablet Take 10 mg by mouth daily.  6   aspirin EC 81 MG tablet Take 81 mg by mouth daily.     atorvastatin (LIPITOR) 40 MG tablet Take 40 mg by mouth at bedtime.     cephALEXin (KEFLEX) 500 MG capsule Take 1 capsule (500 mg total) by mouth 2 (two) times daily for 7 days. 14 capsule 0   Cholecalciferol (VITAMIN D) 2000 UNITS CAPS Take 2,000 Units by mouth daily.     docusate sodium (COLACE) 100 MG capsule Take 100 mg by mouth daily as needed for mild constipation.     Esomeprazole Magnesium 20 MG TBEC Take 20 mg by mouth daily.      ezetimibe (ZETIA) 10 MG tablet Take 10 mg by mouth at bedtime.     furosemide (LASIX) 40 MG tablet TAKE 1/2 TABLET BY MOUTH DAILY 45 tablet 1   loratadine (CLARITIN) 10 MG tablet 1 tablet Orally as needed     LORazepam (ATIVAN) 0.5 MG tablet Take 0.5 mg by mouth 2 (two) times daily. As needed      meclizine (ANTIVERT) 12.5 MG tablet 1-2 tablets orally as needed for dizziness     Multiple Vitamins-Minerals (MULTIVITAMINS THER. W/MINERALS) TABS Take 1 tablet by mouth daily.     omeprazole (PRILOSEC) 20 MG capsule 1 capsule 30 minutes before morning meal Orally Once a day for 90 days     spironolactone (ALDACTONE) 25 MG tablet TAKE 1 TABLET BY MOUTH EVERY  OTHER DAY 50 tablet 2   No current facility-administered medications for this visit.    OBJECTIVE: African-American woman in no acute distress     Body mass index is 33.46 kg/m.    ECOG FS:1 - Symptomatic but completely ambulatory Vitals:   06/21/23 0918  BP: (!) 136/54  Pulse: (!) 55  Resp: 17  Temp: 97.9 F (36.6 C)  TempSrc: Temporal  SpO2: 100%  Weight: 180 lb (81.6 kg)   Sclerae unicteric, EOMs intact She is start postcentral lumpectomy with loss of nipple.  No palpable masses or regional adenopathy in the right breast.  Left breast normal to inspection.  No palpable masses or regional adenopathy in the left breast. Chest: Clear to auscultation bilaterally No lower extremity edema.  LAB RESULTS:  CMP     Component Value Date/Time   NA 135 12/19/2020 1039   K 4.2 12/19/2020 1039   CL 97 (L) 12/19/2020 1039   CO2 26 12/19/2020 1039   GLUCOSE 113 (H) 12/19/2020 1039   BUN 40 (H) 12/19/2020 1039   CREATININE 1.75 (H) 12/19/2020 1039   CREATININE 1.33 (H) 04/08/2018 1549   CALCIUM 9.6 12/19/2020 1039   PROT 8.0 11/26/2020 1741   ALBUMIN 4.1 11/26/2020 1741   AST 44 (H) 11/26/2020 1741   AST 18 04/08/2018 1549   ALT 20 11/26/2020 1741   ALT 12 04/08/2018 1549   ALKPHOS 73 11/26/2020 1741   BILITOT 0.8 11/26/2020 1741   BILITOT 0.5 04/08/2018 1549   GFRNONAA 29 (L) 12/19/2020 1039   GFRNONAA 37 (L) 04/08/2018 1549   GFRAA 48 (L) 07/14/2018 1500   GFRAA 43 (L) 04/08/2018 1549    No results found for: "KPAFRELGTCHN", "LAMBDASER", "KAPLAMBRATIO"  No results found for: "TOTALPROTELP", "ALBUMINELP", "A1GS",  "A2GS", "BETS", "BETA2SER", "GAMS", "MSPIKE", "  SPEI"  Lab Results  Component Value Date   WBC 5.9 11/26/2020   NEUTROABS 2.6 07/14/2018   HGB 11.0 (L) 11/26/2020   HCT 33.8 (L) 11/26/2020   MCV 88.9 11/26/2020   PLT 242 11/26/2020   No results found for: "LABCA2"  No components found for: "LABCA125"  No results for input(s): "INR" in the last 168 hours.  Urinalysis    Component Value Date/Time   COLORURINE YELLOW 09/14/2015 1344   APPEARANCEUR CLEAR 09/14/2015 1344   LABSPEC 1.009 09/14/2015 1344   PHURINE 6.0 09/14/2015 1344   GLUCOSEU NEGATIVE 09/14/2015 1344   HGBUR TRACE (A) 09/14/2015 1344   BILIRUBINUR negative 06/15/2023 1701   KETONESUR trace (5) (A) 06/15/2023 1701   KETONESUR NEGATIVE 09/14/2015 1344   PROTEINUR >=300 (A) 06/15/2023 1701   PROTEINUR NEGATIVE 09/14/2015 1344   UROBILINOGEN 0.2 06/15/2023 1701   UROBILINOGEN 0.2 06/21/2011 1204   NITRITE Negative 06/15/2023 1701   NITRITE NEGATIVE 09/14/2015 1344   LEUKOCYTESUR Large (3+) (A) 06/15/2023 1701    RADIOLOGY AND OTHER STUDIES: MM 3D SCREEN BREAST BILATERAL Result Date: 06/14/2023 CLINICAL DATA:  Screening. Technologist indicates best positioning/images possible. EXAM: DIGITAL SCREENING BILATERAL MAMMOGRAM WITH TOMOSYNTHESIS AND CAD TECHNIQUE: Bilateral screening digital craniocaudal and mediolateral oblique mammograms were obtained. Bilateral screening digital breast tomosynthesis was performed. The images were evaluated with computer-aided detection. COMPARISON:  Previous exam(s). ACR Breast Density Category a: The breasts are almost entirely fatty. FINDINGS: There are no findings suspicious for malignancy. IMPRESSION: No mammographic evidence of malignancy. A result letter of this screening mammogram will be mailed directly to the patient. RECOMMENDATION: Screening mammogram in one year. (Code:SM-B-01Y) BI-RADS CATEGORY  1: Negative. Electronically Signed   By: Sherron Ales M.D.   On: 06/14/2023 16:14      ASSESSMENT: 88 y.o. [place] woman status post right nipple biopsy 03/07/2018, showing Paget's disease of the breast, estrogen and progesterone receptor negative, HER-2 amplified  (1) genetics testing 05/05/2018 through the [facility] STAT + Common Hereditary Cancers Panel showed no deleterious mutations in ATM, BRCA1, BRCA2, CDH1, CHEK2, PALB2, PTEN, STK11 and TP53.  The Common Hereditary Cancers Panel offered by [facility] includes sequencing and/or deletion duplication testing of the following 47 genes: APC, ATM, AXIN2, BARD1, BMPR1A, BRCA1, BRCA2, BRIP1, CDH1, CDKN2A (p14ARF), CDKN2A (p16INK4a), CKD4, CHEK2, CTNNA1, DICER1, EPCAM (Deletion/duplication testing only), GREM1 (promoter region deletion/duplication testing only), KIT, MEN1, MLH1, MSH2, MSH3, MSH6, MUTYH, NBN, NF1, NHTL1, PALB2, PDGFRA, PMS2, POLD1, POLE, PTEN, RAD50, RAD51C, RAD51D, SDHB, SDHC, SDHD, SMAD4, SMARCA4. STK11, TP53, TSC1, TSC2, and VHL.  The following genes were evaluated for sequence changes only: SDHA and HOXB13 c.251G>A variant only.  (2) status post partial central mastectomy 06/11/2018 showing ductal carcinoma in situ, grade 2 measuring 1.5 cm, with negative margins.  (3) no radiation or anti-HER-2 treatment planned   PLAN:  No changes noted on recent mammogram or physical examination. No new symptoms reported. -Continue annual follow-up visits for five years from the time of diagnosis. -Return in 1 yr, we can follow up as needed after 2025 -Encouraged physical activity.  Rachel Moulds, MD   06/21/2023 9:28 AM Medical Oncology and Hematology [facility] [address] [place], [place] [address] Tel. [phone]    Fax. [phone]  Total time spent: 20 min  *Total Encounter Time as defined by the Centers for Medicare and Medicaid Services includes, in addition to the face-to-face time of a patient visit (documented in the note above) non-face-to-face time: obtaining and  reviewing  outside history, ordering and reviewing medications, tests or procedures, care coordination (communications with other health care professionals or caregivers) and documentation in the medical record.

## 2023-06-24 DIAGNOSIS — M9905 Segmental and somatic dysfunction of pelvic region: Secondary | ICD-10-CM | POA: Diagnosis not present

## 2023-06-24 DIAGNOSIS — M9904 Segmental and somatic dysfunction of sacral region: Secondary | ICD-10-CM | POA: Diagnosis not present

## 2023-06-24 DIAGNOSIS — M51361 Other intervertebral disc degeneration, lumbar region with lower extremity pain only: Secondary | ICD-10-CM | POA: Diagnosis not present

## 2023-06-24 DIAGNOSIS — M9903 Segmental and somatic dysfunction of lumbar region: Secondary | ICD-10-CM | POA: Diagnosis not present

## 2023-06-25 DIAGNOSIS — M9904 Segmental and somatic dysfunction of sacral region: Secondary | ICD-10-CM | POA: Diagnosis not present

## 2023-06-25 DIAGNOSIS — M9905 Segmental and somatic dysfunction of pelvic region: Secondary | ICD-10-CM | POA: Diagnosis not present

## 2023-06-25 DIAGNOSIS — M9903 Segmental and somatic dysfunction of lumbar region: Secondary | ICD-10-CM | POA: Diagnosis not present

## 2023-06-25 DIAGNOSIS — M51361 Other intervertebral disc degeneration, lumbar region with lower extremity pain only: Secondary | ICD-10-CM | POA: Diagnosis not present

## 2023-07-30 ENCOUNTER — Telehealth: Payer: Self-pay | Admitting: Cardiology

## 2023-07-30 DIAGNOSIS — G4733 Obstructive sleep apnea (adult) (pediatric): Secondary | ICD-10-CM

## 2023-07-30 NOTE — Telephone Encounter (Signed)
 Pt called stating that [facility] is now under [facility]. Pt states that they are requesting her records in order for her to continue getting supplies. Please advise

## 2023-08-02 NOTE — Telephone Encounter (Signed)
 Reached out to [facility] and sent all paperwork needed for the patient to get her supplies.

## 2023-08-20 DIAGNOSIS — N1832 Chronic kidney disease, stage 3b: Secondary | ICD-10-CM | POA: Diagnosis not present

## 2023-08-20 DIAGNOSIS — D631 Anemia in chronic kidney disease: Secondary | ICD-10-CM | POA: Diagnosis not present

## 2023-08-20 DIAGNOSIS — Z1159 Encounter for screening for other viral diseases: Secondary | ICD-10-CM | POA: Diagnosis not present

## 2023-08-20 DIAGNOSIS — E1122 Type 2 diabetes mellitus with diabetic chronic kidney disease: Secondary | ICD-10-CM | POA: Diagnosis not present

## 2023-08-20 DIAGNOSIS — D649 Anemia, unspecified: Secondary | ICD-10-CM | POA: Diagnosis not present

## 2023-08-20 DIAGNOSIS — I129 Hypertensive chronic kidney disease with stage 1 through stage 4 chronic kidney disease, or unspecified chronic kidney disease: Secondary | ICD-10-CM | POA: Diagnosis not present

## 2023-08-20 DIAGNOSIS — E7800 Pure hypercholesterolemia, unspecified: Secondary | ICD-10-CM | POA: Diagnosis not present

## 2023-09-02 DIAGNOSIS — E1122 Type 2 diabetes mellitus with diabetic chronic kidney disease: Secondary | ICD-10-CM | POA: Diagnosis not present

## 2023-09-02 DIAGNOSIS — Z23 Encounter for immunization: Secondary | ICD-10-CM | POA: Diagnosis not present

## 2023-09-02 DIAGNOSIS — I129 Hypertensive chronic kidney disease with stage 1 through stage 4 chronic kidney disease, or unspecified chronic kidney disease: Secondary | ICD-10-CM | POA: Diagnosis not present

## 2023-09-02 DIAGNOSIS — D631 Anemia in chronic kidney disease: Secondary | ICD-10-CM | POA: Diagnosis not present

## 2023-09-02 DIAGNOSIS — Z0000 Encounter for general adult medical examination without abnormal findings: Secondary | ICD-10-CM | POA: Diagnosis not present

## 2023-09-02 DIAGNOSIS — N2581 Secondary hyperparathyroidism of renal origin: Secondary | ICD-10-CM | POA: Diagnosis not present

## 2023-09-02 DIAGNOSIS — N1832 Chronic kidney disease, stage 3b: Secondary | ICD-10-CM | POA: Diagnosis not present

## 2023-09-02 DIAGNOSIS — E1129 Type 2 diabetes mellitus with other diabetic kidney complication: Secondary | ICD-10-CM | POA: Diagnosis not present

## 2023-09-02 DIAGNOSIS — Z5181 Encounter for therapeutic drug level monitoring: Secondary | ICD-10-CM | POA: Diagnosis not present

## 2023-09-02 DIAGNOSIS — G4730 Sleep apnea, unspecified: Secondary | ICD-10-CM | POA: Diagnosis not present

## 2023-09-02 DIAGNOSIS — N189 Chronic kidney disease, unspecified: Secondary | ICD-10-CM | POA: Diagnosis not present

## 2023-09-23 ENCOUNTER — Ambulatory Visit: Payer: Medicare Other | Attending: Cardiology | Admitting: Cardiology

## 2023-09-23 ENCOUNTER — Encounter: Payer: Self-pay | Admitting: Cardiology

## 2023-09-23 VITALS — BP 118/64 | HR 46 | Ht 61.5 in | Wt 183.8 lb

## 2023-09-23 DIAGNOSIS — R011 Cardiac murmur, unspecified: Secondary | ICD-10-CM | POA: Diagnosis not present

## 2023-09-23 DIAGNOSIS — I10 Essential (primary) hypertension: Secondary | ICD-10-CM

## 2023-09-23 DIAGNOSIS — R001 Bradycardia, unspecified: Secondary | ICD-10-CM | POA: Diagnosis not present

## 2023-09-23 DIAGNOSIS — G4733 Obstructive sleep apnea (adult) (pediatric): Secondary | ICD-10-CM | POA: Diagnosis not present

## 2023-09-23 DIAGNOSIS — E7800 Pure hypercholesterolemia, unspecified: Secondary | ICD-10-CM | POA: Diagnosis not present

## 2023-09-23 DIAGNOSIS — I6523 Occlusion and stenosis of bilateral carotid arteries: Secondary | ICD-10-CM | POA: Diagnosis not present

## 2023-09-23 NOTE — Patient Instructions (Addendum)
 Medication Instructions:  Your physician recommends that you continue on your current medications as directed. Please refer to the Current Medication list given to you today.  *If you need a refill on your cardiac medications before your next appointment, please call your pharmacy*  Lab Work: NONE If you have labs (blood work) drawn today and your tests are completely normal, you will receive your results only by: MyChart Message (if you have MyChart) OR A paper copy in the mail If you have any lab test that is abnormal or we need to change your treatment, we will call you to review the results.  Testing/Procedures: Your physician has requested that you have an echocardiogram. Echocardiography is a painless test that uses sound waves to create images of your heart. It provides your doctor with information about the size and shape of your heart and how well your heart's chambers and valves are working. This procedure takes approximately one hour. There are no restrictions for this procedure. Please do NOT wear cologne, perfume, aftershave, or lotions (deodorant is allowed). Please arrive 15 minutes prior to your appointment time.  Your physician has requested that you have a carotid duplex. This test is an ultrasound of the carotid arteries in your neck. It looks at blood flow through these arteries that supply the brain with blood. Allow one hour for this exam. There are no restrictions or special instructions.     Please note: We ask at that you not bring children with you during ultrasound (echo/ vascular) testing. Due to room size and safety concerns, children are not allowed in the ultrasound rooms during exams. Our front office staff cannot provide observation of children in our lobby area while testing is being conducted. An adult accompanying a patient to their appointment will only be allowed in the ultrasound room at the discretion of the ultrasound technician under special  circumstances. We apologize for any inconvenience.   Follow-Up: At [facility], you and your health needs are our priority.  As part of our continuing mission to provide you with exceptional heart care, our providers are all part of one team.  This team includes your primary Cardiologist (physician) and Advanced Practice Providers or APPs (Physician Assistants and Nurse Practitioners) who all work together to provide you with the care you need, when you need it.  Your next appointment:   1 year(s)  Provider:   DR. Micael Adas   We recommend signing up for the patient portal called "MyChart".  Sign up information is provided on this After Visit Summary.  MyChart is used to connect with patients for Virtual Visits (Telemedicine).  Patients are able to view lab/test results, encounter notes, upcoming appointments, etc.  Non-urgent messages can be sent to your provider as well.   To learn more about what you can do with MyChart, go to [email or web address].   Other Instructions       1st Floor: - Lobby - Registration  - Pharmacy  - Lab - Cafe  2nd Floor: - PV Lab - Diagnostic Testing (echo, CT, nuclear med)  3rd Floor: - Vacant  4th Floor: - TCTS (cardiothoracic surgery) - AFib Clinic - Structural Heart Clinic - Vascular Surgery  - Vascular Ultrasound  5th Floor: - HeartCare Cardiology (general and EP) - Clinical Pharmacy for coumadin, hypertension, lipid, weight-loss medications, and med management appointments    Valet parking services will be available as well.

## 2023-09-23 NOTE — Progress Notes (Signed)
 Evaluation Performed:  Follow-up visit Date:  09/23/2023   ID:  Tiffany, Olsen 01-29-1936, MRN [id number]   PCP:  Perley Bradley, MD  Cardiologist: Jules Oar, MD Sleep medicine:  Gaylyn Keas, MD Electrophysiologist:  None   Chief Complaint:  OSA, HTN  History of Present Illness:    [name] is a 88 y.o. female with a hx of  OSA, obesity, modeSherate pulmonary HTN, diastolic dysfunction and HTN. She is doing well with her PAP device and thinks that She has gotten used to it.  She tolerates the mask and feels the pressure is adequate.  Since going on PAP she feels rested in the am and has no significant daytime sleepiness.  She denies any significant mouth or nasal dryness or nasal congestion.  She does not think that he snores.    Prior CV studies:   The following studies were reviewed today:  PAP compliance download from [facility]  Past Medical History:  Diagnosis Date   Anemia    takes iron   Arthritis    Dr Genevive Ket   Carotid artery stenosis    1-39% bilateral dopplers 08/2015   Cataracts, bilateral    Chronic kidney disease    stage III Dr Harry Lindau   Diabetes mellitus    Family history of breast cancer    Family history of breast cancer    Family history of melanoma    GERD (gastroesophageal reflux disease)    Heart murmur    Dr Jacquelynn Matter   HTN (hypertension)    Hyperlipidemia    Morbid obesity with BMI of 40.0-44.9, adult (HCC)    Obstructive sleep apnea (adult) (pediatric)    w CPAP Severe OSA AHI 52/hr CPAP at 10cm H2O   Pulmonary HTN (HCC)    mild with PASP echo 02/2014   Right carotid bruit 08/19/2014   Shortness of breath    resolved with CPAP   Urination frequency    Vitamin D deficiency disease    Wears dentures    upper   Wears glasses    Past Surgical History:  Procedure Laterality Date   ABDOMINAL HYSTERECTOMY     partial   APPENDECTOMY     CARDIOVASCULAR STRESS TEST  2011   CHOLECYSTECTOMY N/A 10/04/2015    Procedure: LAPAROSCOPIC CHOLECYSTECTOMY;  Surgeon: Oza Blumenthal, MD;  Location: [facility];  Service: General;  Laterality: N/A;   COLONOSCOPY  01/15/2012   Procedure: COLONOSCOPY;  Surgeon: Yvetta Herbert, MD;  Location: [facility];  Service: Endoscopy;  Laterality: N/A;   ESOPHAGOGASTRODUODENOSCOPY  01/15/2012   Procedure: ESOPHAGOGASTRODUODENOSCOPY (EGD);  Surgeon: Yvetta Herbert, MD;  Location: [facility];  Service: Endoscopy;  Laterality: N/A;   JOINT REPLACEMENT Bilateral    Knees   left shoulder surgery      rotator cuff tear    MASTECTOMY, PARTIAL Right 06/11/2018   Procedure: RIGHT BREAST PARTIAL MASTECTOMY;  Surgeon: Oza Blumenthal, MD;  Location: [facility] OR;  Service: General;  Laterality: Right;   sleep study  2012   TOTAL KNEE ARTHROPLASTY     Left   TOTAL KNEE ARTHROPLASTY  07/02/2011   Procedure: TOTAL KNEE ARTHROPLASTY;  Surgeon: Florencia Hunter, MD;  Location: [facility] OR;  Service: Orthopedics;  Laterality: Right;   US  ECHOCARDIOGRAPHY     2012     Current Meds  Medication Sig   acetaminophen  (TYLENOL ) 500 MG tablet Take 500 mg by mouth every 8 (eight) hours as needed for mild pain, moderate pain,  fever or headache.    amLODipine  (NORVASC ) 10 MG tablet Take 10 mg by mouth daily.   aspirin EC 81 MG tablet Take 81 mg by mouth daily.   atorvastatin  (LIPITOR) 40 MG tablet Take 40 mg by mouth at bedtime.   Cholecalciferol (VITAMIN D) 2000 UNITS CAPS Take 2,000 Units by mouth daily.   docusate sodium  (COLACE) 100 MG capsule Take 100 mg by mouth daily as needed for mild constipation.   Esomeprazole  Magnesium  20 MG TBEC Take 20 mg by mouth daily.    ezetimibe  (ZETIA ) 10 MG tablet Take 10 mg by mouth at bedtime.   loratadine (CLARITIN) 10 MG tablet 1 tablet Orally as needed   LORazepam  (ATIVAN ) 0.5 MG tablet Take 0.5 mg by mouth 2 (two) times daily. As needed   Multiple Vitamins-Minerals (MULTIVITAMINS THER. W/MINERALS) TABS Take 1 tablet by mouth daily.   spironolactone   (ALDACTONE ) 25 MG tablet TAKE 1 TABLET BY MOUTH EVERY  OTHER DAY     Allergies:   Codeine and Ibuprofen   Social History   Tobacco Use   Smoking status: Never   Smokeless tobacco: Never  Vaping Use   Vaping status: Never Used  Substance Use Topics   Alcohol use: No   Drug use: No     Family Hx: The patient's family history includes Breast cancer in her maternal aunt; Diabetes in her father and mother; Melanoma in her cousin.  ROS:   Please see the history of present illness.     All other systems reviewed and are negative.   Labs/Other Tests and Data Reviewed:    Recent Labs: No results found for requested labs within last 365 days.   Recent Lipid Panel No results found for: "CHOL", "TRIG", "HDL", "CHOLHDL", "LDLCALC", "LDLDIRECT"  Wt Readings from Last 3 Encounters:  09/23/23 183 lb 12.8 oz (83.4 kg)  06/21/23 180 lb (81.6 kg)  09/28/22 180 lb 6.4 oz (81.8 kg)     Objective:    GEN: Well nourished, well developed in no acute distress HEENT: Normal NECK: No JVD; No carotid bruits LYMPHATICS: No lymphadenopathy CARDIAC:RRR, no rubs, gallops, 2/6 SM at RUSB to LLSB RESPIRATORY:  Clear to auscultation without rales, wheezing or rhonchi  ABDOMEN: Soft, non-tender, non-distended MUSCULOSKELETAL:  trace edema; No deformity  SKIN: Warm and dry NEUROLOGIC:  Alert and oriented x 3 PSYCHIATRIC:  Normal affect  ASSESSMENT & PLAN:    1.  OSA - The patient is tolerating PAP therapy well without any problems. The PAP download performed by his DME was personally reviewed and interpreted by me today and showed an AHI of 0.2/hr on 10 cm H2O with 97% compliance in using more than 4 hours nightly.  The patient has been using and benefiting from PAP use and will continue to benefit from therapy.    2.  HTN - BP is adequately controlled on exam today - Continue prescription drug managed with amlodipine  10 mg daily and spironolactone  25 mg every other day with as needed  refills  3.  Bradycardia - She remains asymptomatic  4.  Bilateral carotid artery stenosis - 1 to 39% stenosis by Dopplers 2019 - Repeat Dopplers - Continue prescription drug management with Zetia  10 mg daily and atorvastatin  40 mg daily with as needed refills  5. HLD -LDL goal < 70 -I have personally reviewed and interpreted outside labs performed by patient's PCP which showed LDL 97, HDL 77, ALT 11 on 1/61/0960  6.  Heart murmur - check 2 D  echo  Medication Adjustments/Labs and Tests Ordered: Current medicines are reviewed at length with the patient today.  Concerns regarding medicines are outlined above.  Tests Ordered: No orders of the defined types were placed in this encounter.  Medication Changes: No orders of the defined types were placed in this encounter.   Disposition:  Follow up in 1 year(s)  Signed, Gaylyn Keas, MD  09/23/2023 8:32 AM    [facility]

## 2023-10-03 DIAGNOSIS — R051 Acute cough: Secondary | ICD-10-CM | POA: Diagnosis not present

## 2023-10-03 DIAGNOSIS — J301 Allergic rhinitis due to pollen: Secondary | ICD-10-CM | POA: Diagnosis not present

## 2023-10-03 DIAGNOSIS — R062 Wheezing: Secondary | ICD-10-CM | POA: Diagnosis not present

## 2023-10-05 DIAGNOSIS — R051 Acute cough: Secondary | ICD-10-CM | POA: Diagnosis not present

## 2023-10-05 DIAGNOSIS — J209 Acute bronchitis, unspecified: Secondary | ICD-10-CM | POA: Diagnosis not present

## 2023-10-05 DIAGNOSIS — R062 Wheezing: Secondary | ICD-10-CM | POA: Diagnosis not present

## 2023-10-30 ENCOUNTER — Encounter: Payer: Self-pay | Admitting: Cardiology

## 2023-10-30 ENCOUNTER — Telehealth: Payer: Self-pay | Admitting: Cardiology

## 2023-10-30 DIAGNOSIS — N390 Urinary tract infection, site not specified: Secondary | ICD-10-CM | POA: Diagnosis not present

## 2023-10-30 DIAGNOSIS — R001 Bradycardia, unspecified: Secondary | ICD-10-CM | POA: Diagnosis not present

## 2023-10-30 NOTE — Telephone Encounter (Signed)
 STAT if HR is under 50 or over 120  (normal HR is 60-100 beats per minute)  What is your heart rate? 49 within the last 30 minutes   Do you have a log of your heart rate readings (document readings)?  49    Do you have any other symptoms? Pt went to [facility] urgent care because she was having pain in her back and frequent urination. They noticed small amount of blood in her urine and wrote her an rx for UTI. She states pt has been c/o fatigue. She states she has a photo of EKG from there and she will upload to [employer]. Please advise.

## 2023-10-30 NOTE — Telephone Encounter (Signed)
 Called and spoke to pt's daughter.  Pt's daughter verbalized understanding and was thankful for call.

## 2023-10-30 NOTE — Telephone Encounter (Signed)
 Spoke with daughter and she is aware there will be   No change continue current medical regimen unless she starts feeling bad such as dizziness or exertional fatigue or passing out. She verbalized undrstanding

## 2023-10-30 NOTE — Telephone Encounter (Signed)
 Spoke with patient and daughter. Patient went to [facility] walk in clinic for possible UTI. Her HR was at 49. EKG was done and they advised she reach out to cardiology. Patient states she feels fine. Denies any chest pain, SOB, swelling,headache, nausea or vomiting.  Daughter is uploading EKG to my chart for review

## 2023-11-19 ENCOUNTER — Ambulatory Visit: Payer: Self-pay | Admitting: Cardiology

## 2023-11-19 ENCOUNTER — Ambulatory Visit (HOSPITAL_COMMUNITY)
Admission: RE | Admit: 2023-11-19 | Discharge: 2023-11-19 | Disposition: A | Discharge status: Home / Self Care | Source: Ambulatory Visit | Attending: Cardiology | Admitting: Cardiology

## 2023-11-19 ENCOUNTER — Ambulatory Visit (HOSPITAL_COMMUNITY)
Admission: RE | Admit: 2023-11-19 | Discharge: 2023-11-19 | Disposition: A | Discharge status: Home / Self Care | Source: Ambulatory Visit | Attending: Cardiovascular Disease | Admitting: Cardiovascular Disease

## 2023-11-19 DIAGNOSIS — R011 Cardiac murmur, unspecified: Secondary | ICD-10-CM | POA: Diagnosis not present

## 2023-11-19 DIAGNOSIS — I6523 Occlusion and stenosis of bilateral carotid arteries: Secondary | ICD-10-CM | POA: Insufficient documentation

## 2023-11-19 LAB — ECHOCARDIOGRAM COMPLETE
AR max vel: 1.16 cm2
AV Area VTI: 1.32 cm2
AV Area mean vel: 1.27 cm2
AV Mean grad: 13 mmHg
AV Peak grad: 25.4 mmHg
Ao pk vel: 2.52 m/s
Area-P 1/2: 3.6 cm2
S' Lateral: 2.8 cm

## 2023-11-24 ENCOUNTER — Encounter: Payer: Self-pay | Admitting: Cardiology

## 2023-11-25 DIAGNOSIS — H2513 Age-related nuclear cataract, bilateral: Secondary | ICD-10-CM | POA: Diagnosis not present

## 2023-11-25 DIAGNOSIS — H04123 Dry eye syndrome of bilateral lacrimal glands: Secondary | ICD-10-CM | POA: Diagnosis not present

## 2023-11-25 DIAGNOSIS — E119 Type 2 diabetes mellitus without complications: Secondary | ICD-10-CM | POA: Diagnosis not present

## 2023-11-25 DIAGNOSIS — H35033 Hypertensive retinopathy, bilateral: Secondary | ICD-10-CM | POA: Diagnosis not present

## 2023-11-25 NOTE — Telephone Encounter (Signed)
-----   Message from [name] sent at 11/24/2023  9:02 PM EDT ----- 1-39% bilateral carotid artery stenosis  - repeat dopplers in 2 years ----- Message ----- From: Interface, [employer] Sent: 11/19/2023  11:26 AM EDT To: Tiffany JONELLE Bihari, MD

## 2023-11-29 DIAGNOSIS — M79671 Pain in right foot: Secondary | ICD-10-CM | POA: Diagnosis not present

## 2023-12-17 DIAGNOSIS — M5136 Other intervertebral disc degeneration, lumbar region with discogenic back pain only: Secondary | ICD-10-CM | POA: Diagnosis not present

## 2023-12-17 DIAGNOSIS — M9904 Segmental and somatic dysfunction of sacral region: Secondary | ICD-10-CM | POA: Diagnosis not present

## 2023-12-17 DIAGNOSIS — M9903 Segmental and somatic dysfunction of lumbar region: Secondary | ICD-10-CM | POA: Diagnosis not present

## 2023-12-17 DIAGNOSIS — M9905 Segmental and somatic dysfunction of pelvic region: Secondary | ICD-10-CM | POA: Diagnosis not present

## 2023-12-19 DIAGNOSIS — M9905 Segmental and somatic dysfunction of pelvic region: Secondary | ICD-10-CM | POA: Diagnosis not present

## 2023-12-19 DIAGNOSIS — M5136 Other intervertebral disc degeneration, lumbar region with discogenic back pain only: Secondary | ICD-10-CM | POA: Diagnosis not present

## 2023-12-19 DIAGNOSIS — M9904 Segmental and somatic dysfunction of sacral region: Secondary | ICD-10-CM | POA: Diagnosis not present

## 2023-12-19 DIAGNOSIS — M9903 Segmental and somatic dysfunction of lumbar region: Secondary | ICD-10-CM | POA: Diagnosis not present

## 2023-12-24 DIAGNOSIS — M9904 Segmental and somatic dysfunction of sacral region: Secondary | ICD-10-CM | POA: Diagnosis not present

## 2023-12-24 DIAGNOSIS — M9903 Segmental and somatic dysfunction of lumbar region: Secondary | ICD-10-CM | POA: Diagnosis not present

## 2023-12-24 DIAGNOSIS — M9905 Segmental and somatic dysfunction of pelvic region: Secondary | ICD-10-CM | POA: Diagnosis not present

## 2023-12-24 DIAGNOSIS — M5136 Other intervertebral disc degeneration, lumbar region with discogenic back pain only: Secondary | ICD-10-CM | POA: Diagnosis not present

## 2023-12-26 DIAGNOSIS — M9904 Segmental and somatic dysfunction of sacral region: Secondary | ICD-10-CM | POA: Diagnosis not present

## 2023-12-26 DIAGNOSIS — M9905 Segmental and somatic dysfunction of pelvic region: Secondary | ICD-10-CM | POA: Diagnosis not present

## 2023-12-26 DIAGNOSIS — M9903 Segmental and somatic dysfunction of lumbar region: Secondary | ICD-10-CM | POA: Diagnosis not present

## 2023-12-26 DIAGNOSIS — M5136 Other intervertebral disc degeneration, lumbar region with discogenic back pain only: Secondary | ICD-10-CM | POA: Diagnosis not present

## 2023-12-31 DIAGNOSIS — M9905 Segmental and somatic dysfunction of pelvic region: Secondary | ICD-10-CM | POA: Diagnosis not present

## 2023-12-31 DIAGNOSIS — M5136 Other intervertebral disc degeneration, lumbar region with discogenic back pain only: Secondary | ICD-10-CM | POA: Diagnosis not present

## 2023-12-31 DIAGNOSIS — M9903 Segmental and somatic dysfunction of lumbar region: Secondary | ICD-10-CM | POA: Diagnosis not present

## 2023-12-31 DIAGNOSIS — M9904 Segmental and somatic dysfunction of sacral region: Secondary | ICD-10-CM | POA: Diagnosis not present

## 2024-01-07 DIAGNOSIS — M5136 Other intervertebral disc degeneration, lumbar region with discogenic back pain only: Secondary | ICD-10-CM | POA: Diagnosis not present

## 2024-01-07 DIAGNOSIS — M9903 Segmental and somatic dysfunction of lumbar region: Secondary | ICD-10-CM | POA: Diagnosis not present

## 2024-01-07 DIAGNOSIS — M9905 Segmental and somatic dysfunction of pelvic region: Secondary | ICD-10-CM | POA: Diagnosis not present

## 2024-01-07 DIAGNOSIS — M9904 Segmental and somatic dysfunction of sacral region: Secondary | ICD-10-CM | POA: Diagnosis not present

## 2024-01-10 DIAGNOSIS — M25561 Pain in right knee: Secondary | ICD-10-CM | POA: Diagnosis not present

## 2024-02-19 ENCOUNTER — Other Ambulatory Visit (HOSPITAL_COMMUNITY): Payer: Self-pay | Admitting: Internal Medicine

## 2024-02-24 DIAGNOSIS — N1832 Chronic kidney disease, stage 3b: Secondary | ICD-10-CM | POA: Diagnosis not present

## 2024-02-24 DIAGNOSIS — Z23 Encounter for immunization: Secondary | ICD-10-CM | POA: Diagnosis not present

## 2024-02-24 DIAGNOSIS — E7800 Pure hypercholesterolemia, unspecified: Secondary | ICD-10-CM | POA: Diagnosis not present

## 2024-02-24 DIAGNOSIS — E1122 Type 2 diabetes mellitus with diabetic chronic kidney disease: Secondary | ICD-10-CM | POA: Diagnosis not present

## 2024-03-04 DIAGNOSIS — I129 Hypertensive chronic kidney disease with stage 1 through stage 4 chronic kidney disease, or unspecified chronic kidney disease: Secondary | ICD-10-CM | POA: Diagnosis not present

## 2024-03-04 DIAGNOSIS — N1832 Chronic kidney disease, stage 3b: Secondary | ICD-10-CM | POA: Diagnosis not present

## 2024-03-04 DIAGNOSIS — N2581 Secondary hyperparathyroidism of renal origin: Secondary | ICD-10-CM | POA: Diagnosis not present

## 2024-03-04 DIAGNOSIS — D631 Anemia in chronic kidney disease: Secondary | ICD-10-CM | POA: Diagnosis not present

## 2024-03-04 DIAGNOSIS — E1122 Type 2 diabetes mellitus with diabetic chronic kidney disease: Secondary | ICD-10-CM | POA: Diagnosis not present

## 2024-05-13 ENCOUNTER — Other Ambulatory Visit: Payer: Self-pay | Admitting: Hematology and Oncology

## 2024-05-13 DIAGNOSIS — Z1231 Encounter for screening mammogram for malignant neoplasm of breast: Secondary | ICD-10-CM

## 2024-06-16 ENCOUNTER — Ambulatory Visit
Admission: RE | Admit: 2024-06-16 | Discharge: 2024-06-16 | Disposition: A | Source: Ambulatory Visit | Attending: Hematology and Oncology

## 2024-06-16 DIAGNOSIS — Z1231 Encounter for screening mammogram for malignant neoplasm of breast: Secondary | ICD-10-CM

## 2024-06-19 ENCOUNTER — Telehealth: Payer: Self-pay | Admitting: Hematology and Oncology

## 2024-06-19 NOTE — Telephone Encounter (Signed)
 I spoke with patient's daughter as she called in to schedule patient's annual visit with MD on 07/21/2024.

## 2024-06-24 ENCOUNTER — Encounter (HOSPITAL_BASED_OUTPATIENT_CLINIC_OR_DEPARTMENT_OTHER): Payer: Self-pay | Admitting: Emergency Medicine

## 2024-06-24 ENCOUNTER — Emergency Department (HOSPITAL_BASED_OUTPATIENT_CLINIC_OR_DEPARTMENT_OTHER)

## 2024-06-24 ENCOUNTER — Other Ambulatory Visit: Payer: Self-pay

## 2024-06-24 ENCOUNTER — Emergency Department (HOSPITAL_BASED_OUTPATIENT_CLINIC_OR_DEPARTMENT_OTHER)
Admission: EM | Admit: 2024-06-24 | Discharge: 2024-06-24 | Disposition: A | Discharge status: Home / Self Care | Attending: Emergency Medicine | Admitting: Emergency Medicine

## 2024-06-24 DIAGNOSIS — Y92002 Bathroom of unspecified non-institutional (private) residence single-family (private) house as the place of occurrence of the external cause: Secondary | ICD-10-CM | POA: Insufficient documentation

## 2024-06-24 DIAGNOSIS — M542 Cervicalgia: Secondary | ICD-10-CM | POA: Diagnosis not present

## 2024-06-24 DIAGNOSIS — Z7982 Long term (current) use of aspirin: Secondary | ICD-10-CM | POA: Insufficient documentation

## 2024-06-24 DIAGNOSIS — S0083XA Contusion of other part of head, initial encounter: Secondary | ICD-10-CM | POA: Diagnosis not present

## 2024-06-24 DIAGNOSIS — N189 Chronic kidney disease, unspecified: Secondary | ICD-10-CM | POA: Diagnosis not present

## 2024-06-24 DIAGNOSIS — S12390A Other displaced fracture of fourth cervical vertebra, initial encounter for closed fracture: Secondary | ICD-10-CM | POA: Diagnosis not present

## 2024-06-24 DIAGNOSIS — Z79899 Other long term (current) drug therapy: Secondary | ICD-10-CM | POA: Diagnosis not present

## 2024-06-24 DIAGNOSIS — W1812XA Fall from or off toilet with subsequent striking against object, initial encounter: Secondary | ICD-10-CM | POA: Insufficient documentation

## 2024-06-24 DIAGNOSIS — S129XXA Fracture of neck, unspecified, initial encounter: Secondary | ICD-10-CM

## 2024-06-24 DIAGNOSIS — I129 Hypertensive chronic kidney disease with stage 1 through stage 4 chronic kidney disease, or unspecified chronic kidney disease: Secondary | ICD-10-CM | POA: Diagnosis not present

## 2024-06-24 DIAGNOSIS — S199XXA Unspecified injury of neck, initial encounter: Secondary | ICD-10-CM | POA: Diagnosis present

## 2024-06-24 DIAGNOSIS — R296 Repeated falls: Secondary | ICD-10-CM

## 2024-06-24 MED ORDER — IOHEXOL 350 MG/ML SOLN
50.0000 mL | Freq: Once | INTRAVENOUS | Status: AC | PRN
  Administered 2024-06-24: 50 mL via INTRAVENOUS

## 2024-06-24 NOTE — ED Triage Notes (Signed)
 Reports mechanical fall this morning. Clemens off of chair.  Hit face over floor. Denies LOC.  C/o neck soreness.

## 2024-06-24 NOTE — ED Provider Notes (Signed)
 " [place] EMERGENCY DEPARTMENT AT [facility] Provider Note   CSN: [id number] Arrival date & time: 06/24/24  1501     Patient presents with: [name] [name] is a 89 y.o. female.  With a history of CKD hypertension vitamin D deficiency who presents to the ED for injuries after fall.  Patient was in her bathroom at home earlier this morning.  She attempted to stand up off of the toilet and lost her balance falling forward.  Striking her head.  Now reporting headache and neck soreness.  She required help getting up off the ground but has ambulated since then.  Denies chest pain shortness of breath abdominal pain pain in extremities and lower back pain.  No anticoagulation.    Fall       Prior to Admission medications  Medication Sig Start Date End Date Taking? Authorizing Provider  acetaminophen  (TYLENOL ) 500 MG tablet Take 500 mg by mouth every 8 (eight) hours as needed for mild pain, moderate pain, fever or headache.     [provider]  amLODipine  (NORVASC ) 10 MG tablet Take 10 mg by mouth daily. 02/17/15   [provider]  aspirin EC 81 MG tablet Take 81 mg by mouth daily.    [provider]  atorvastatin  (LIPITOR) 40 MG tablet Take 40 mg by mouth at bedtime.    [provider]  Cholecalciferol (VITAMIN D) 2000 UNITS CAPS Take 2,000 Units by mouth daily.    [provider]  docusate sodium  (COLACE) 100 MG capsule Take 100 mg by mouth daily as needed for mild constipation.    [provider]  Esomeprazole  Magnesium  20 MG TBEC Take 20 mg by mouth daily.     [provider]  ezetimibe  (ZETIA ) 10 MG tablet Take 10 mg by mouth at bedtime.    [provider]  loratadine (CLARITIN) 10 MG tablet 1 tablet Orally as needed 05/07/22   [provider]  LORazepam  (ATIVAN ) 0.5 MG tablet Take 0.5 mg by mouth 2 (two) times daily. As needed    [provider]  Multiple Vitamins-Minerals  (MULTIVITAMINS THER. W/MINERALS) TABS Take 1 tablet by mouth daily.    [provider]  spironolactone  (ALDACTONE ) 25 MG tablet TAKE 1 TABLET BY MOUTH EVERY  OTHER DAY 02/20/24   Shlomo Wilbert SAUNDERS, MD    Allergies: Codeine and Ibuprofen    Review of Systems  Updated Vital Signs BP (!) 121/97   Pulse (!) 56   Temp 98.7 F (37.1 C)   Resp 15   SpO2 100%   Physical Exam Vitals and nursing note reviewed.  HENT:     Head:     Comments: Frontal hematoma small over midline forehead No facial trauma otherwise Eyes:     Pupils: Pupils are equal, round, and reactive to light.  Neck:     Comments: Midline and paraspinal cervical tenderness over lower cervical region Cardiovascular:     Rate and Rhythm: Normal rate and regular rhythm.  Pulmonary:     Effort: Pulmonary effort is normal.     Breath sounds: Normal breath sounds.  Abdominal:     Palpations: Abdomen is soft.     Tenderness: There is no abdominal tenderness.  Musculoskeletal:     Cervical back: Neck supple. Tenderness present.     Comments: No midline tenderness step-off deformity of lumbar or thoracic spine 5 out of 5 motor strength bilateral upper and lower extremities Sensation intact to touch throughout  Skin:    General: Skin is warm and dry.  Neurological:     Mental Status: She is alert and oriented to person, place, and time.     Sensory: No sensory deficit.     Motor: No weakness.  Psychiatric:        Mood and Affect: Mood normal.     (all labs ordered are listed, but only abnormal results are displayed) Labs Reviewed - No data to display  EKG: None  Radiology: CT ANGIO HEAD NECK W WO CM Result Date: 06/24/2024 EXAM: CTA HEAD AND NECK WITH AND WITHOUT 06/24/2024 06:25:16 PM TECHNIQUE: CTA of the head and neck was performed with and without the administration of 50 mL of iohexol  (OMNIPAQUE ) 350 mg/mL injection. Multiplanar 2D and/or 3D reformatted images are provided for review. Automated  exposure control, iterative reconstruction, and/or weight based adjustment of the mA/kV was utilized to reduce the radiation dose to as low as reasonably achievable. Stenosis of the internal carotid arteries measured using NASCET criteria. COMPARISON: Same day CT head CLINICAL HISTORY: Neck trauma, arterial injury suspected; C4 L TP fx s/p fall ?vascular injury. FINDINGS: CTA NECK: AORTIC ARCH AND ARCH VESSELS: Mild atherosclerosis of the aortic arch. No dissection or arterial injury. No significant stenosis of the brachiocephalic or subclavian arteries. CERVICAL CAROTID ARTERIES: Mild tortuosity of the proximal right common carotid artery. Mild atherosclerosis at the right carotid bifurcation without stenosis. Partial retropharyngeal course of the proximal right cervical ICA. Calcified atherosclerosis at the left carotid bifurcation. No hemodynamically significant stenosis or retropharyngeal course of the proximal left cervical ICA. No dissection or arterial injury. No hemodynamically significant stenosis by NASCET criteria. CERVICAL VERTEBRAL ARTERIES: Tortuosity of the V1 and V2 segments of the bilateral vertebral arteries. No dissection, arterial injury, or significant stenosis. LUNGS AND MEDIASTINUM: Unremarkable. SOFT TISSUES: Subcentimeter nodule in the left thyroid  lobe. Edentulous maxilla. No acute abnormality. BONES: Degenerative changes throughout the visualized spine. No acute abnormality. CTA HEAD: ANTERIOR CIRCULATION: Atherosclerosis of the bilateral carotid siphons without significant stenosis. There is mild tortuosity of the bilateral cavernous ICAs. 2 mm inferiorly directed outpouching along the left supraclinoid ICA at the origin of the posterior communicating artery favored to reflect an infundibulum versus a less likely small aneurysm. No significant stenosis of the anterior cerebral arteries. No significant stenosis of the middle cerebral arteries. POSTERIOR CIRCULATION: No significant stenosis  of the posterior cerebral arteries. No significant stenosis of the basilar artery. No significant stenosis of the vertebral arteries. No aneurysm. OTHER: There is prominent enlargement bilaterally of the supraorbital veins more so on the left. No findings to suggest vascular malformation. No dural venous sinus thrombosis on this non-dedicated study. IMPRESSION: 1. No evidence of acute arterial injury in the head or neck. 2. 2 mm inferiorly directed outpouching along the left supraclinoid ICA at the origin of the posterior communicating artery, favored to reflect an infundibulum versus a less likely small aneurysm. 3. Mild atherosclerosis of the aortic arch and carotid bifurcations without hemodynamically significant stenosis. Electronically signed by: Donnice Mania MD 06/24/2024 07:19 PM EST RP Workstation: [id number]   CT Head Wo Contrast Result Date: 06/24/2024 CLINICAL DATA:  Fall EXAM: CT HEAD WITHOUT CONTRAST CT CERVICAL SPINE WITHOUT CONTRAST TECHNIQUE: Multidetector CT imaging of the head and cervical spine was performed following the standard protocol without intravenous contrast. Multiplanar CT image reconstructions of the cervical spine were also generated. RADIATION DOSE REDUCTION: This exam was performed according to the departmental dose-optimization program which includes automated exposure control, adjustment  of the mA and/or kV according to patient size and/or use of iterative reconstruction technique. COMPARISON:  CT brain 04/03/2009 FINDINGS: CT HEAD FINDINGS Brain: No acute territorial infarction, hemorrhage or intracranial mass. Atrophy. Nonenlarged ventricles. Vascular: No hyperdense vessels. Vertebral and carotid vascular calcification Skull: No depressed skull fracture Sinuses/Orbits: No acute finding. Other: None CT CERVICAL SPINE FINDINGS Alignment: Straightening of the cervical spine. No subluxation. Facet alignment is normal Skull base and vertebrae: Craniovertebral junction is intact.  Vertebral body heights appear normal. Linear lucency at the left transverse process of C4, series 4, image 37 and sagittal series 7, image 21, raising concern for a nondisplaced fracture, this extends to the left transverse foramen. Soft tissues and spinal canal: No prevertebral fluid or swelling. No visible canal hematoma. Disc levels: Moderate severe diffuse disc space narrowing and degenerative change C3 through C7. No high-grade bony canal stenosis. Facet degenerative changes at multiple levels with foraminal narrowing. Moderate severe C1-C2 degenerative changes. Upper chest: Negative. Other: None IMPRESSION: 1. No CT evidence for acute intracranial abnormality. Atrophy. 2. Linear lucency at the left transverse process of C4 extending to the transverse foramen, raising concern for a nondisplaced fracture. Consider correlation with CTA to exclude vascular injury. 3. Straightening of the cervical spine with multilevel degenerative changes. Critical Value/emergent results were called by telephone at the time of interpretation on 06/24/2024 at 3:52 pm to provider Redell for Dr. Pamella, who verbally acknowledged these results. Electronically Signed   By: Luke Bun M.D.   On: 06/24/2024 15:53   CT Cervical Spine Wo Contrast Result Date: 06/24/2024 CLINICAL DATA:  Fall EXAM: CT HEAD WITHOUT CONTRAST CT CERVICAL SPINE WITHOUT CONTRAST TECHNIQUE: Multidetector CT imaging of the head and cervical spine was performed following the standard protocol without intravenous contrast. Multiplanar CT image reconstructions of the cervical spine were also generated. RADIATION DOSE REDUCTION: This exam was performed according to the departmental dose-optimization program which includes automated exposure control, adjustment of the mA and/or kV according to patient size and/or use of iterative reconstruction technique. COMPARISON:  CT brain 04/03/2009 FINDINGS: CT HEAD FINDINGS Brain: No acute territorial infarction, hemorrhage or  intracranial mass. Atrophy. Nonenlarged ventricles. Vascular: No hyperdense vessels. Vertebral and carotid vascular calcification Skull: No depressed skull fracture Sinuses/Orbits: No acute finding. Other: None CT CERVICAL SPINE FINDINGS Alignment: Straightening of the cervical spine. No subluxation. Facet alignment is normal Skull base and vertebrae: Craniovertebral junction is intact. Vertebral body heights appear normal. Linear lucency at the left transverse process of C4, series 4, image 37 and sagittal series 7, image 21, raising concern for a nondisplaced fracture, this extends to the left transverse foramen. Soft tissues and spinal canal: No prevertebral fluid or swelling. No visible canal hematoma. Disc levels: Moderate severe diffuse disc space narrowing and degenerative change C3 through C7. No high-grade bony canal stenosis. Facet degenerative changes at multiple levels with foraminal narrowing. Moderate severe C1-C2 degenerative changes. Upper chest: Negative. Other: None IMPRESSION: 1. No CT evidence for acute intracranial abnormality. Atrophy. 2. Linear lucency at the left transverse process of C4 extending to the transverse foramen, raising concern for a nondisplaced fracture. Consider correlation with CTA to exclude vascular injury. 3. Straightening of the cervical spine with multilevel degenerative changes. Critical Value/emergent results were called by telephone at the time of interpretation on 06/24/2024 at 3:52 pm to provider Redell for Dr. Pamella, who verbally acknowledged these results. Electronically Signed   By: Luke Bun M.D.   On: 06/24/2024 15:53     Procedures  Medications Ordered in the ED  iohexol  (OMNIPAQUE ) 350 MG/ML injection 50 mL (50 mLs Intravenous Contrast Given 06/24/24 1813)    Clinical Course as of 06/24/24 1932  Wed Jun 24, 2024  1930 CTA neck shows no vascular injury.  Patient has remained stable.  Counseled her on symptomatic management of transverse process  fracture.  We will discharge her with a cervical collar but she does not need to wear this as this is not an unstable fracture.  She can wear the collar at home if it help with comfort.  Return precautions for severe neck pain were discussed with her and daughter at bedside.  Stable for discharge [MP]    Clinical Course User Index [MP] Pamella Ozell LABOR, DO                                 Medical Decision Making 89 year old female with history as above presented to the ED for injuries after fall.  Mechanical fall as she fell forward getting up off of the toilet.  Positive facial trauma.  No LOC.  Now with headache and neck discomfort.  CT head cervical spine ordered from triage.  Upon my initial assessment she has got a frontal hematoma and some midline paraspinal cervical tenderness.  No other evidence of acute injury.  CT head shows no acute injury.  CT cervical spine concerning for isolated left C4 transverse process fracture.  Will need CTA of the neck to evaluate for vascular injury.  Will place her in a cervical collar to monitor neurologic status.  No neurologic deficits in the upper or lower extremities on my assessment she remains hemodynamically stable and well-appearing  Amount and/or Complexity of Data Reviewed Radiology: ordered.  Risk Prescription drug management.        Final diagnoses:  Fall in elderly patient  Closed fracture of transverse process of cervical vertebra, initial encounter [facility])  Contusion of face, initial encounter    ED Discharge Orders     None          Pamella Ozell LABOR, DO 06/24/24 1932  "

## 2024-06-24 NOTE — Discharge Instructions (Signed)
 You were seen in the emerged department for injuries after fall There was no bleeding in your brain or skull fracture The CAT scan of your cervical spine did show a broken bone as we discussed this is the left transverse process of C4 There is no injury to the blood vessels You can wear the collar if it helps with the discomfort but this is no need to stay on You do not need to see a neurosurgeon Follow-up with your primary doctor Take Tylenol  as directed for pain and discomfort or ibuprofen as directed Return to the emergency room for severe neck pain weakness in your arms or any other concerns

## 2024-07-21 ENCOUNTER — Inpatient Hospital Stay: Admitting: Hematology and Oncology
